# Patient Record
Sex: Male | Born: 1965
Health system: Southern US, Community
[De-identification: ages and names within clinical notes are randomized; demographics above are authoritative.]

## PROBLEM LIST (undated history)

## (undated) DIAGNOSIS — J45909 Unspecified asthma, uncomplicated: Secondary | ICD-10-CM

## (undated) DIAGNOSIS — R942 Abnormal results of pulmonary function studies: Secondary | ICD-10-CM

## (undated) DIAGNOSIS — I10 Essential (primary) hypertension: Secondary | ICD-10-CM

## (undated) DIAGNOSIS — M549 Dorsalgia, unspecified: Secondary | ICD-10-CM

## (undated) DIAGNOSIS — G4730 Sleep apnea, unspecified: Secondary | ICD-10-CM

## (undated) DIAGNOSIS — J449 Chronic obstructive pulmonary disease, unspecified: Secondary | ICD-10-CM

## (undated) HISTORY — DX: Chronic obstructive pulmonary disease, unspecified: J44.9

## (undated) HISTORY — PX: COLON SURGERY: SHX602

## (undated) HISTORY — DX: Unspecified asthma, uncomplicated: J45.909

---

## 2004-08-26 ENCOUNTER — Ambulatory Visit: Payer: Self-pay | Admitting: Family Medicine

## 2004-12-13 ENCOUNTER — Ambulatory Visit: Payer: Self-pay | Admitting: Gastroenterology

## 2005-06-11 ENCOUNTER — Encounter: Payer: Self-pay | Admitting: General Practice

## 2005-06-26 ENCOUNTER — Encounter: Payer: Self-pay | Admitting: General Practice

## 2005-07-27 ENCOUNTER — Encounter: Payer: Self-pay | Admitting: General Practice

## 2005-08-26 ENCOUNTER — Encounter: Payer: Self-pay | Admitting: General Practice

## 2010-11-11 ENCOUNTER — Other Ambulatory Visit: Payer: Self-pay | Admitting: Orthopedic Surgery

## 2010-11-11 DIAGNOSIS — R531 Weakness: Secondary | ICD-10-CM

## 2010-11-11 DIAGNOSIS — M25562 Pain in left knee: Secondary | ICD-10-CM

## 2010-11-14 ENCOUNTER — Ambulatory Visit
Admission: RE | Admit: 2010-11-14 | Discharge: 2010-11-14 | Disposition: A | Discharge status: Home / Self Care | Payer: BC Managed Care – PPO | Source: Ambulatory Visit | Attending: Orthopedic Surgery | Admitting: Orthopedic Surgery

## 2010-11-14 DIAGNOSIS — R531 Weakness: Secondary | ICD-10-CM

## 2010-11-14 DIAGNOSIS — M25562 Pain in left knee: Secondary | ICD-10-CM

## 2011-04-10 ENCOUNTER — Ambulatory Visit: Payer: Self-pay | Admitting: Gastroenterology

## 2011-12-19 ENCOUNTER — Ambulatory Visit: Payer: Self-pay | Admitting: Sports Medicine

## 2011-12-26 ENCOUNTER — Emergency Department: Payer: Self-pay | Admitting: Internal Medicine

## 2011-12-26 LAB — COMPREHENSIVE METABOLIC PANEL
Albumin: 3.7 g/dL (ref 3.4–5.0)
Alkaline Phosphatase: 77 U/L (ref 50–136)
Anion Gap: 6 — ABNORMAL LOW (ref 7–16)
BUN: 10 mg/dL (ref 7–18)
Bilirubin,Total: 0.4 mg/dL (ref 0.2–1.0)
Calcium, Total: 8.5 mg/dL (ref 8.5–10.1)
Chloride: 107 mmol/L (ref 98–107)
Co2: 27 mmol/L (ref 21–32)
Creatinine: 0.91 mg/dL (ref 0.60–1.30)
EGFR (African American): >60
EGFR (Non-African Amer.): >60
Glucose: 129 mg/dL — ABNORMAL HIGH (ref 65–99)
Osmolality: 280 (ref 275–301)
Potassium: 3.6 mmol/L (ref 3.5–5.1)
SGOT(AST): 23 U/L (ref 15–37)
SGPT (ALT): 42 U/L (ref 12–78)
Sodium: 140 mmol/L (ref 136–145)
Total Protein: 7.4 g/dL (ref 6.4–8.2)

## 2011-12-26 LAB — URINALYSIS, COMPLETE
Bacteria: NONE SEEN
Bilirubin,UR: NEGATIVE
Blood: NEGATIVE
Glucose,UR: NEGATIVE mg/dL (ref 0–75)
Ketone: NEGATIVE
Leukocyte Esterase: NEGATIVE
Nitrite: NEGATIVE
Ph: 7 (ref 4.5–8.0)
Protein: NEGATIVE
RBC,UR: 1 /HPF (ref 0–5)
Specific Gravity: 1.017 (ref 1.003–1.030)
Squamous Epithelial: NONE SEEN
WBC UR: 1 /HPF (ref 0–5)

## 2011-12-26 LAB — LIPASE, BLOOD: Lipase: 171 U/L (ref 73–393)

## 2011-12-27 ENCOUNTER — Emergency Department: Payer: Self-pay | Admitting: Emergency Medicine

## 2011-12-27 LAB — CBC
HCT: 44.8 % (ref 40.0–52.0)
HGB: 15.4 g/dL (ref 13.0–18.0)
MCH: 28.1 pg (ref 26.0–34.0)
MCHC: 34.3 g/dL (ref 32.0–36.0)
MCV: 82 fL (ref 80–100)
Platelet: 238 10*3/uL (ref 150–440)
RBC: 5.48 10*6/uL (ref 4.40–5.90)
RDW: 13.5 % (ref 11.5–14.5)
WBC: 8.4 10*3/uL (ref 3.8–10.6)

## 2012-01-01 ENCOUNTER — Ambulatory Visit: Payer: Self-pay | Admitting: Gastroenterology

## 2012-01-08 ENCOUNTER — Ambulatory Visit: Payer: Self-pay | Admitting: Gastroenterology

## 2012-01-13 LAB — PATHOLOGY REPORT

## 2012-01-28 ENCOUNTER — Ambulatory Visit: Payer: Self-pay | Admitting: Gastroenterology

## 2013-10-08 ENCOUNTER — Emergency Department: Payer: Self-pay | Admitting: Emergency Medicine

## 2013-10-08 LAB — COMPREHENSIVE METABOLIC PANEL
Albumin: 4 g/dL (ref 3.4–5.0)
Alkaline Phosphatase: 89 U/L
Anion Gap: 9 (ref 7–16)
BUN: 11 mg/dL (ref 7–18)
Bilirubin,Total: 0.5 mg/dL (ref 0.2–1.0)
Calcium, Total: 8.7 mg/dL (ref 8.5–10.1)
Chloride: 107 mmol/L (ref 98–107)
Co2: 23 mmol/L (ref 21–32)
Creatinine: 1.04 mg/dL (ref 0.60–1.30)
EGFR (African American): >60
EGFR (Non-African Amer.): >60
Glucose: 90 mg/dL (ref 65–99)
Osmolality: 276 (ref 275–301)
Potassium: 3.9 mmol/L (ref 3.5–5.1)
SGOT(AST): 31 U/L (ref 15–37)
SGPT (ALT): 39 U/L (ref 12–78)
Sodium: 139 mmol/L (ref 136–145)
Total Protein: 7.6 g/dL (ref 6.4–8.2)

## 2013-10-08 LAB — CBC WITH DIFFERENTIAL/PLATELET
Basophil #: 0.1 10*3/uL (ref 0.0–0.1)
Basophil %: 0.9 %
Eosinophil #: 0 10*3/uL (ref 0.0–0.7)
Eosinophil %: 0.2 %
HCT: 46.6 % (ref 40.0–52.0)
HGB: 15.8 g/dL (ref 13.0–18.0)
Lymphocyte #: 1.4 10*3/uL (ref 1.0–3.6)
Lymphocyte %: 13.4 %
MCH: 28.1 pg (ref 26.0–34.0)
MCHC: 33.9 g/dL (ref 32.0–36.0)
MCV: 83 fL (ref 80–100)
Monocyte #: 1.1 x10 3/mm — ABNORMAL HIGH (ref 0.2–1.0)
Monocyte %: 10.4 %
Neutrophil #: 7.9 10*3/uL — ABNORMAL HIGH (ref 1.4–6.5)
Neutrophil %: 75.1 %
Platelet: 224 10*3/uL (ref 150–440)
RBC: 5.62 10*6/uL (ref 4.40–5.90)
RDW: 13.7 % (ref 11.5–14.5)
WBC: 10.6 10*3/uL (ref 3.8–10.6)

## 2013-12-12 IMAGING — CR DG ABDOMEN 3V
1 series · 5 of 5 positions shown · non-contrast
Comparison: none

REASON FOR EXAM: epigastric pain
COMMENTS:

PROCEDURE:     DXR - DXR ABDOMEN 3-WAY (INCL PA CXR)  - December 26, 2011  [DATE]
RESULT:

[Series 1: pa · 0.17mm/px · 5 of 5 slices shown]
[im 1/5]
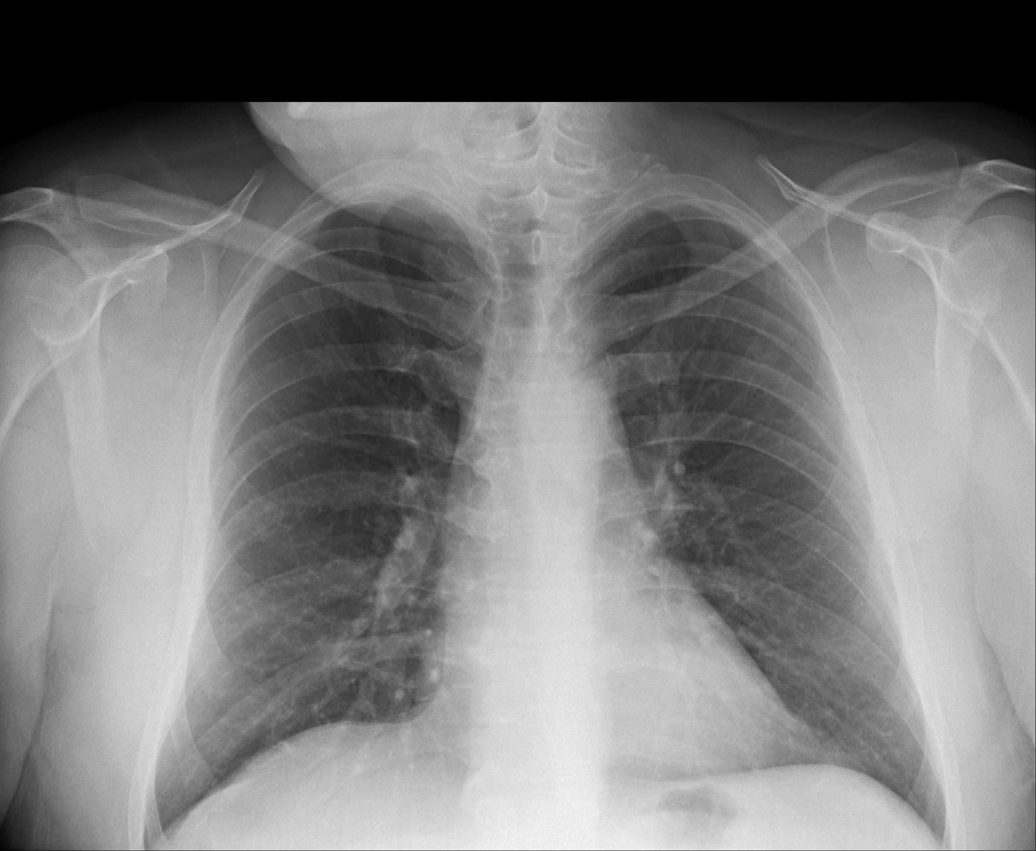
[im 2/5]
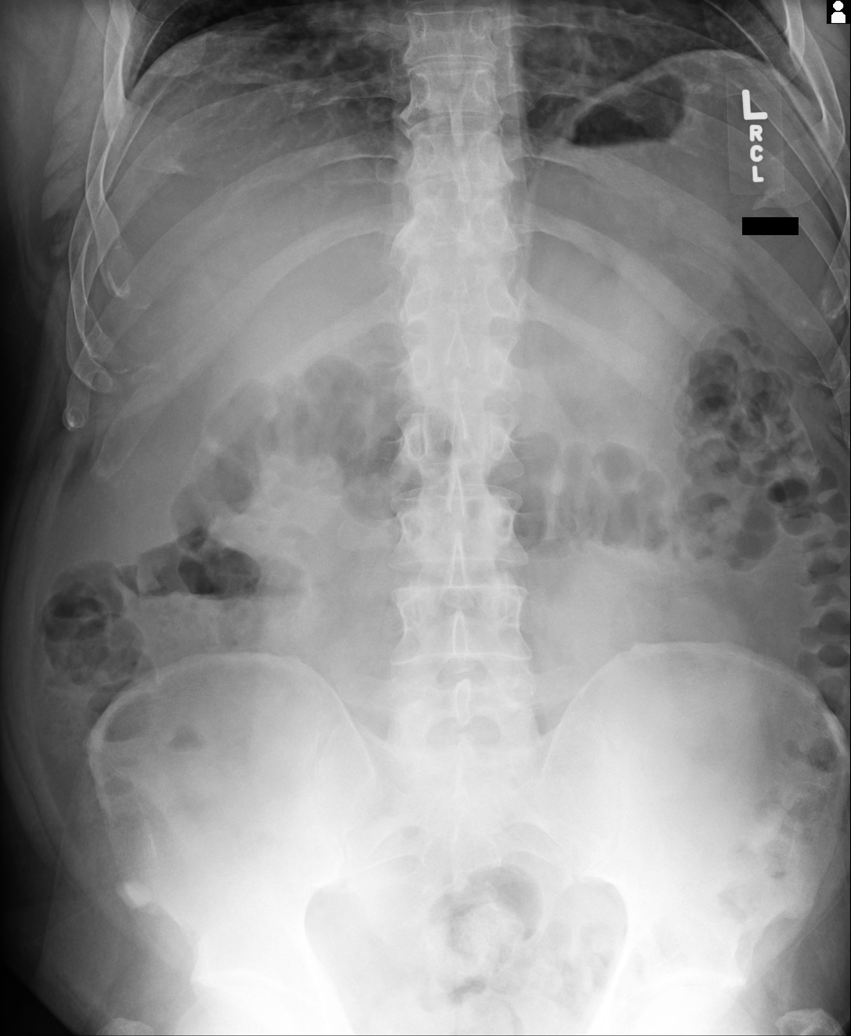
[im 3/5]
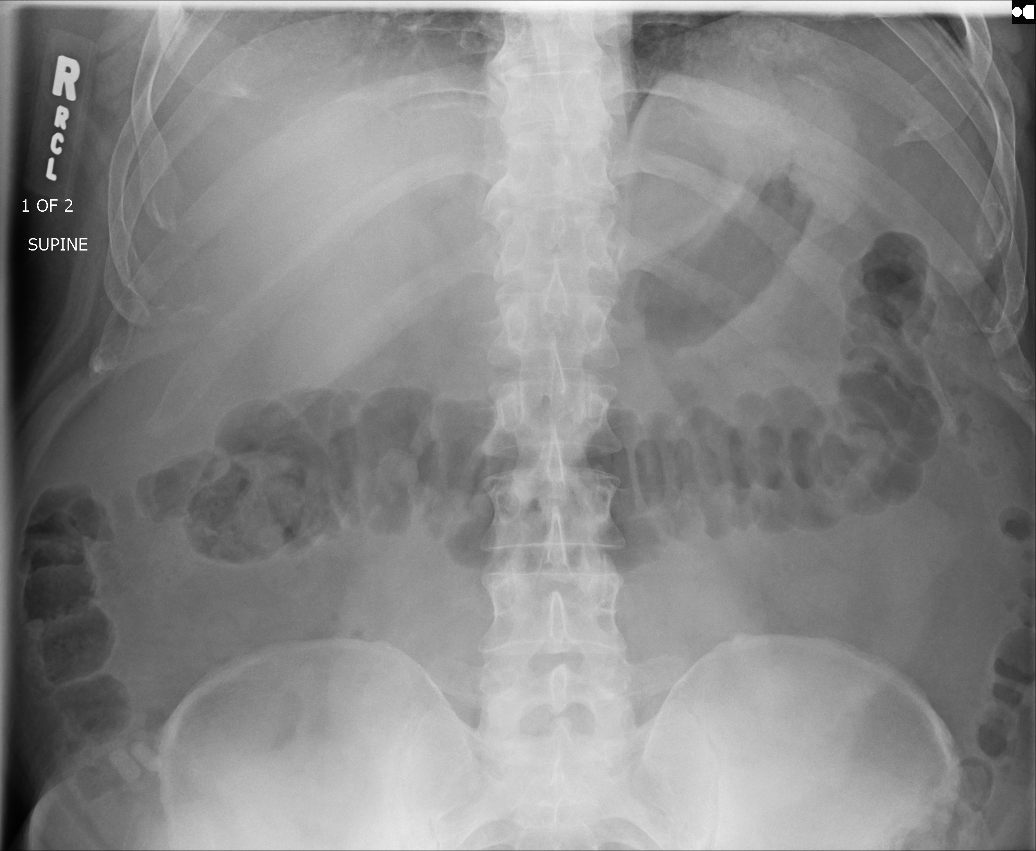
[im 4/5]
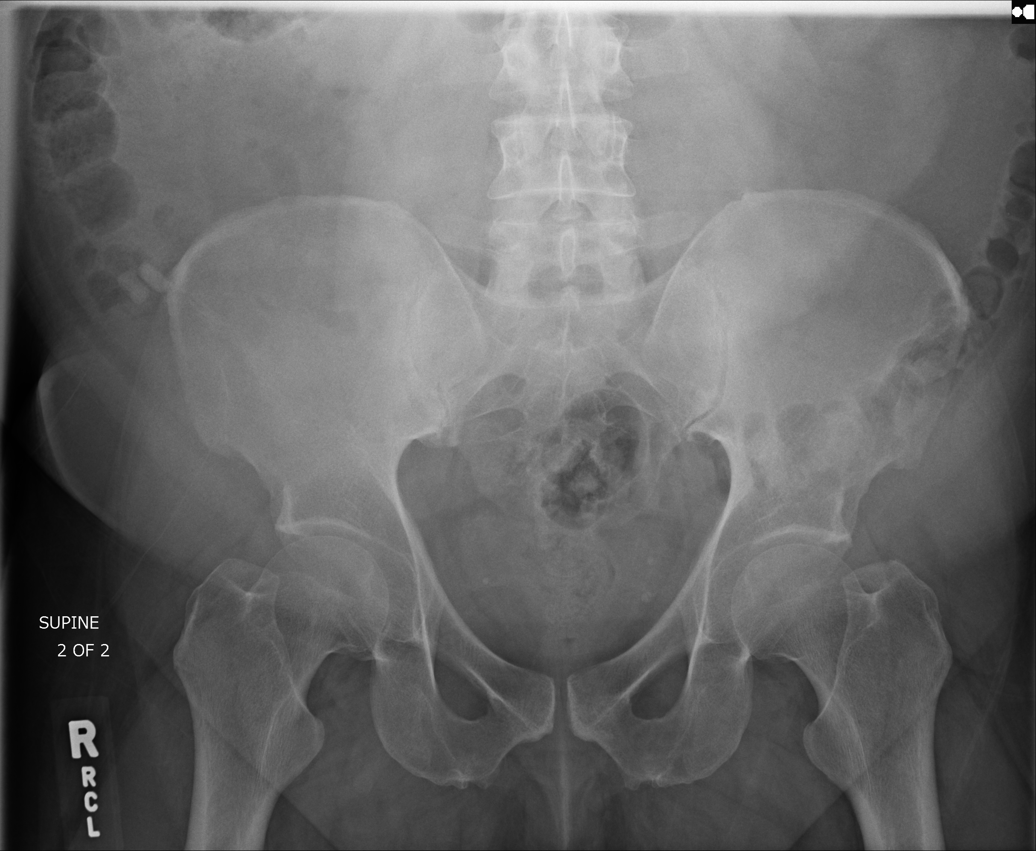
[im 5/5]
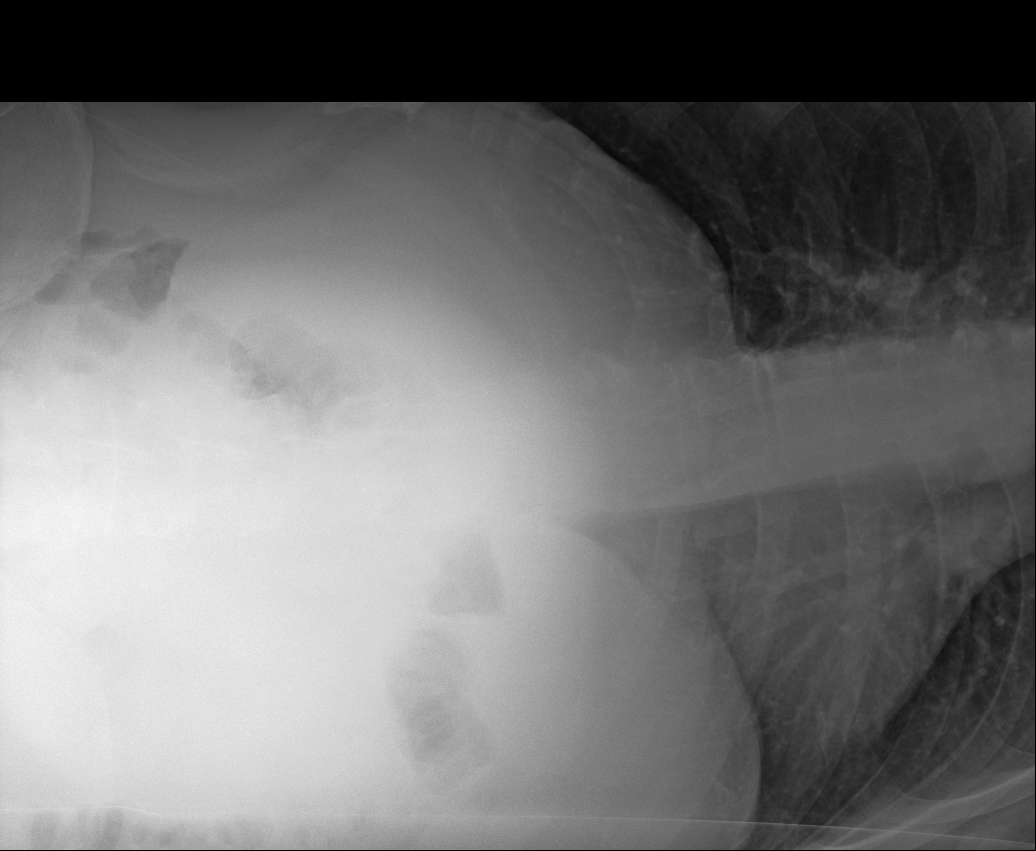

[5 of 5 positions shown; findings below may reference images not displayed]

FINDINGS: Frontal view of the chest demonstrates no evidence of focal
infiltrates, effusions or edema.

Air is seen within nondilated loops of large and small bowel. There is no
evidence of free air. The visualized bony skeleton is unremarkable.
IMPRESSION: 1. Chest radiograph without evidence of acute cardiopulmonary disease.
2. Nonobstructive bowel gas pattern.

## 2014-05-02 ENCOUNTER — Emergency Department: Payer: Self-pay | Admitting: Emergency Medicine

## 2014-05-02 LAB — URINALYSIS, COMPLETE
Bacteria: NONE SEEN
Bilirubin,UR: NEGATIVE
Blood: NEGATIVE
Glucose,UR: NEGATIVE mg/dL (ref 0–75)
Leukocyte Esterase: NEGATIVE
Nitrite: NEGATIVE
Ph: 6 (ref 4.5–8.0)
Protein: NEGATIVE
RBC,UR: 3 /HPF (ref 0–5)
Specific Gravity: 1.016 (ref 1.003–1.030)
Squamous Epithelial: NONE SEEN
WBC UR: 1 /HPF (ref 0–5)

## 2014-05-02 LAB — COMPREHENSIVE METABOLIC PANEL
Albumin: 4.3 g/dL (ref 3.4–5.0)
Alkaline Phosphatase: 88 U/L
Anion Gap: 11 (ref 7–16)
BUN: 14 mg/dL (ref 7–18)
Bilirubin,Total: 0.4 mg/dL (ref 0.2–1.0)
Calcium, Total: 8.9 mg/dL (ref 8.5–10.1)
Chloride: 105 mmol/L (ref 98–107)
Co2: 23 mmol/L (ref 21–32)
Creatinine: 0.96 mg/dL (ref 0.60–1.30)
EGFR (African American): >60
EGFR (Non-African Amer.): >60
Glucose: 124 mg/dL — ABNORMAL HIGH (ref 65–99)
Osmolality: 279 (ref 275–301)
Potassium: 3.6 mmol/L (ref 3.5–5.1)
SGOT(AST): 20 U/L (ref 15–37)
SGPT (ALT): 44 U/L
Sodium: 139 mmol/L (ref 136–145)
Total Protein: 7.6 g/dL (ref 6.4–8.2)

## 2014-05-02 LAB — CBC WITH DIFFERENTIAL/PLATELET
Basophil #: 0.1 10*3/uL (ref 0.0–0.1)
Basophil %: 1.2 %
Eosinophil #: 0.1 10*3/uL (ref 0.0–0.7)
Eosinophil %: 0.6 %
HCT: 48 % (ref 40.0–52.0)
HGB: 15.8 g/dL (ref 13.0–18.0)
Lymphocyte #: 2.8 10*3/uL (ref 1.0–3.6)
Lymphocyte %: 33.3 %
MCH: 27.1 pg (ref 26.0–34.0)
MCHC: 32.8 g/dL (ref 32.0–36.0)
MCV: 83 fL (ref 80–100)
Monocyte #: 0.7 x10 3/mm (ref 0.2–1.0)
Monocyte %: 8.7 %
Neutrophil #: 4.7 10*3/uL (ref 1.4–6.5)
Neutrophil %: 56.2 %
Platelet: 297 10*3/uL (ref 150–440)
RBC: 5.81 10*6/uL (ref 4.40–5.90)
RDW: 13.6 % (ref 11.5–14.5)
WBC: 8.4 10*3/uL (ref 3.8–10.6)

## 2014-12-08 ENCOUNTER — Observation Stay
Admission: EM | Admit: 2014-12-08 | Discharge: 2014-12-09 | Disposition: A | Discharge status: Home / Self Care | Payer: 59 | Attending: Internal Medicine | Admitting: Internal Medicine

## 2014-12-08 ENCOUNTER — Encounter: Payer: Self-pay | Admitting: Emergency Medicine

## 2014-12-08 ENCOUNTER — Emergency Department: Payer: 59

## 2014-12-08 DIAGNOSIS — M549 Dorsalgia, unspecified: Secondary | ICD-10-CM | POA: Diagnosis not present

## 2014-12-08 DIAGNOSIS — R51 Headache: Secondary | ICD-10-CM | POA: Diagnosis not present

## 2014-12-08 DIAGNOSIS — R0789 Other chest pain: Principal | ICD-10-CM | POA: Insufficient documentation

## 2014-12-08 DIAGNOSIS — R002 Palpitations: Secondary | ICD-10-CM | POA: Diagnosis present

## 2014-12-08 DIAGNOSIS — Z79899 Other long term (current) drug therapy: Secondary | ICD-10-CM | POA: Insufficient documentation

## 2014-12-08 DIAGNOSIS — M79602 Pain in left arm: Secondary | ICD-10-CM | POA: Insufficient documentation

## 2014-12-08 DIAGNOSIS — E876 Hypokalemia: Secondary | ICD-10-CM | POA: Diagnosis not present

## 2014-12-08 DIAGNOSIS — G4730 Sleep apnea, unspecified: Secondary | ICD-10-CM | POA: Insufficient documentation

## 2014-12-08 DIAGNOSIS — I10 Essential (primary) hypertension: Secondary | ICD-10-CM | POA: Diagnosis not present

## 2014-12-08 DIAGNOSIS — R42 Dizziness and giddiness: Secondary | ICD-10-CM | POA: Diagnosis not present

## 2014-12-08 DIAGNOSIS — Z87891 Personal history of nicotine dependence: Secondary | ICD-10-CM | POA: Diagnosis not present

## 2014-12-08 DIAGNOSIS — R079 Chest pain, unspecified: Secondary | ICD-10-CM

## 2014-12-08 HISTORY — DX: Sleep apnea, unspecified: G47.30

## 2014-12-08 HISTORY — DX: Essential (primary) hypertension: I10

## 2014-12-08 HISTORY — DX: Dorsalgia, unspecified: M54.9

## 2014-12-08 HISTORY — DX: Abnormal results of pulmonary function studies: R94.2

## 2014-12-08 LAB — TROPONIN I
Troponin I: <0.03 ng/mL (ref <0.031)
Troponin I: <0.03 ng/mL (ref <0.031)
Troponin I: <0.03 ng/mL (ref <0.031)

## 2014-12-08 LAB — COMPREHENSIVE METABOLIC PANEL WITH GFR
ALT: 34 U/L (ref 17–63)
AST: 25 U/L (ref 15–41)
Albumin: 4.4 g/dL (ref 3.5–5.0)
Alkaline Phosphatase: 72 U/L (ref 38–126)
Anion gap: 8 (ref 5–15)
BUN: 18 mg/dL (ref 6–20)
CO2: 26 mmol/L (ref 22–32)
Calcium: 9.3 mg/dL (ref 8.9–10.3)
Chloride: 106 mmol/L (ref 101–111)
Creatinine, Ser: 0.95 mg/dL (ref 0.61–1.24)
GFR calc Af Amer: >60 mL/min (ref >60)
GFR calc non Af Amer: >60 mL/min (ref >60)
Glucose, Bld: 155 mg/dL — ABNORMAL HIGH (ref 65–99)
Potassium: 3.4 mmol/L — ABNORMAL LOW (ref 3.5–5.1)
Sodium: 140 mmol/L (ref 135–145)
Total Bilirubin: 0.7 mg/dL (ref 0.3–1.2)
Total Protein: 7.2 g/dL (ref 6.5–8.1)

## 2014-12-08 LAB — CBC
HCT: 43.8 % (ref 40.0–52.0)
Hemoglobin: 14.8 g/dL (ref 13.0–18.0)
MCH: 27.9 pg (ref 26.0–34.0)
MCHC: 33.8 g/dL (ref 32.0–36.0)
MCV: 82.6 fL (ref 80.0–100.0)
Platelets: 243 10*3/uL (ref 150–440)
RBC: 5.3 MIL/uL (ref 4.40–5.90)
RDW: 13.8 % (ref 11.5–14.5)
WBC: 6.5 10*3/uL (ref 3.8–10.6)

## 2014-12-08 MED ORDER — TIZANIDINE HCL 4 MG PO TABS: 4.0000 mg | ORAL_TABLET | Freq: Three times a day (TID) | ORAL | Status: DC | PRN

## 2014-12-08 MED ORDER — AMLODIPINE BESYLATE 5 MG PO TABS
5.0000 mg | ORAL_TABLET | Freq: Every day | ORAL | Status: DC
  Administered 2014-12-09: 5 mg via ORAL
  Filled 2014-12-08: qty 1

## 2014-12-08 MED ORDER — ENOXAPARIN SODIUM 40 MG/0.4ML ~~LOC~~ SOLN
40.0000 mg | SUBCUTANEOUS | Status: DC
Start: 2014-12-08 — End: 2014-12-09
  Administered 2014-12-08: 40 mg via SUBCUTANEOUS
  Filled 2014-12-08: qty 0.4

## 2014-12-08 MED ORDER — HYDROCODONE-ACETAMINOPHEN 5-325 MG PO TABS
1.0000 | ORAL_TABLET | Freq: Four times a day (QID) | ORAL | Status: DC | PRN
  Administered 2014-12-08 – 2014-12-09 (×3): 1 via ORAL
  Filled 2014-12-08: qty 2
  Filled 2014-12-08 (×2): qty 1

## 2014-12-08 MED ORDER — ASPIRIN EC 81 MG PO TBEC
81.0000 mg | DELAYED_RELEASE_TABLET | Freq: Every day | ORAL | Status: DC
  Administered 2014-12-09: 81 mg via ORAL
  Filled 2014-12-08 (×2): qty 1

## 2014-12-08 MED ORDER — ACETAMINOPHEN 325 MG PO TABS: 650.0000 mg | ORAL_TABLET | Freq: Four times a day (QID) | ORAL | Status: DC | PRN

## 2014-12-08 MED ORDER — ASPIRIN 81 MG PO CHEW
324.0000 mg | CHEWABLE_TABLET | Freq: Once | ORAL | Status: AC
  Administered 2014-12-08: 324 mg via ORAL
  Filled 2014-12-08: qty 4

## 2014-12-08 MED ORDER — ACETAMINOPHEN 650 MG RE SUPP: 650.0000 mg | Freq: Four times a day (QID) | RECTAL | Status: DC | PRN

## 2014-12-08 MED ORDER — POTASSIUM CHLORIDE CRYS ER 20 MEQ PO TBCR
40.0000 meq | EXTENDED_RELEASE_TABLET | Freq: Once | ORAL | Status: AC
  Administered 2014-12-08: 40 meq via ORAL
  Filled 2014-12-08: qty 2

## 2014-12-08 MED ORDER — SODIUM CHLORIDE 0.9 % IJ SOLN
3.0000 mL | Freq: Two times a day (BID) | INTRAMUSCULAR | Status: DC
  Administered 2014-12-08 – 2014-12-09 (×2): 3 mL via INTRAVENOUS

## 2014-12-08 MED ORDER — TRAMADOL HCL 50 MG PO TABS
50.0000 mg | ORAL_TABLET | Freq: Three times a day (TID) | ORAL | Status: DC
  Administered 2014-12-08 – 2014-12-09 (×2): 50 mg via ORAL
  Filled 2014-12-08 (×2): qty 1

## 2014-12-08 NOTE — ED Provider Notes (Signed)
[facility] Emergency Department Provider Note ____________________________________________  Time seen: Approximately 3:44 PM  I have reviewed the triage vital signs and the nursing notes.   HISTORY  Chief Complaint Chest Pain   HPI Evan Cherry is a 49 y.o. male with past medical history of hypertension who presents with chest pain that began today around 50 AM while he was doing some programming on his computer and was under some emotional stress. It was associated with shortness of breath but not nausea or sweating. It lasted approximately until 1 PM and waxed and waned during that timeframe. It was left substernal and was a max of 4 out of 10 and has resolved after patient took his tramadol and extra strength Tylenol for his chronic back pain and is now 1 out of 10. He also notes that he had left arm pain under his axilla since 9 PM last night that has been intermittent. He's also had intermittent lightheadedness about once every other week for the past 6 months to one year. 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 He notes having about 4 episodes of "heart fluttering" that resolved when he intentionally coughs hard.  No recent fever, upper respiratory illness, vomiting, diarrhea.  Past Medical History  Diagnosis Date  . Hypertension     There are no active problems to display for this patient.   History reviewed. No pertinent past surgical history.  Current Outpatient Rx  Name  Route  Sig  Dispense  Refill  . acetaminophen (TYLENOL) 500 MG tablet   Oral   Take 500 mg by mouth 3 (three) times daily.         Marland Kitchen amLODipine (NORVASC) 5 MG tablet   Oral   Take 5 mg by mouth daily.         Marland Kitchen HYDROcodone-acetaminophen (NORCO/VICODIN) 5-325 MG per tablet   Oral   Take 1-2 tablets by mouth every 6 (six) hours as needed for moderate pain.         . meloxicam (MOBIC) 15 MG  tablet   Oral   Take 15 mg by mouth daily.         . Multiple Vitamins-Minerals (MULTIVITAMIN PO)   Oral   Take 1 tablet by mouth daily.         Marland Kitchen tiZANidine (ZANAFLEX) 4 MG tablet   Oral   Take 4 mg by mouth 3 (three) times daily as needed for muscle spasms.         . traMADol (ULTRAM) 50 MG tablet   Oral   Take 50 mg by mouth 3 (three) times daily.           Allergies Review of patient's allergies indicates no known allergies.  No family history on file.  Social History Social History  Substance Use Topics  . Smoking status: Former Research scientist (life sciences)  . Smokeless tobacco: Current User    Types: Snuff  . Alcohol Use: No    Review of Systems Constitutional: No fever ENT: No URI Cardiovascular: See history of present illness  Respiratory: See history of present illness Gastrointestinal: No abdominal pain.   Musculoskeletal: Chronic Back pain is at baseline   10-point ROS otherwise negative.  ____________________________________________   PHYSICAL EXAM:  VITAL SIGNS: ED Triage Vitals  Enc Vitals Group     BP 12/08/14 1228 143/82 mmHg     Pulse Rate 12/08/14 1228 72  Resp 12/08/14 1228 18     Temp 12/08/14 1228 99.2 F (37.3 C)     Temp Source 12/08/14 1228 Oral     SpO2 12/08/14 1228 96 %     Weight --      Height --      Head Cir --      Peak Flow --      Pain Score 12/08/14 1225 4     Pain Loc --      Pain Edu? --      Excl. in [place]? --    Constitutional: Alert and oriented. Well appearing and in no acute distress. Eyes: Conjunctivae are normal. PERRL. EOMI. Head: Atraumatic. Nose: No congestion/rhinnorhea. Mouth/Throat: Mucous membranes are moist.  Oropharynx non-erythematous. Neck: No stridor.   Lymphatic: No cervical lymphadenopathy. Cardiovascular: Normal rate, regular rhythm. Grossly normal heart sounds.  Peripheral pulses 2+ B Respiratory: Normal respiratory effort.  No retractions. Lungs CTAB. Gastrointestinal: Soft and nontender. No  distention. Normal bowel sounds.  Musculoskeletal: No lower extremity tenderness nor edema.  No calf TTP. Neurologic:  Normal speech and language. No gross focal neurologic deficits are appreciated. Speech is normal.  Skin:  Skin is warm, dry and intact. No rash noted. Psychiatric: Mood and affect are normal. Speech and behavior are normal.  ____________________________________________   LABS (all labs ordered are listed, but only abnormal results are displayed)  Labs Reviewed  COMPREHENSIVE METABOLIC PANEL - Abnormal; Notable for the following:    Potassium 3.4 (*)    Glucose, Bld 155 (*)    All other components within normal limits  CBC  TROPONIN I  TROPONIN I   ____________________________________________  EKG   Date: 12/08/2014  Rate: 73  Rhythm: normal sinus rhythm  QRS Axis: normal  Intervals: normal  ST/T Wave abnormalities: normal  Conduction Disutrbances: none  Narrative Interpretation: unremarkable   ____________________________________________  RADIOLOGY  Chest x-ray-no acute disease ____________________________________________   PROCEDURES  Procedure(s) performed: none  Critical Care performed: none ____________________________________________   INITIAL IMPRESSION / ASSESSMENT AND PLAN / ED COURSE  Pertinent labs & imaging results that were available during my care of the patient were reviewed by me and considered in my medical decision making (see chart for details).  Chest pain concerning for possible ACS. Will admit to cycle cardiac enzymes and undergo stress testing. Discussed with Dr. Leslye Peer, Prime Doc who will admit. ____________________________________________   FINAL CLINICAL IMPRESSION(S) / ED DIAGNOSES  Final diagnoses:  Chest pain radiating to arm  Intermittent palpitations       Ponciano Ort, MD 12/08/14 1609

## 2014-12-08 NOTE — ED Notes (Signed)
Pt to ED with c/o midsternal cp that started this am and has been constant, yesterday states he had some intermitted left arm pain and numbness

## 2014-12-08 NOTE — Progress Notes (Addendum)
Pt. admitted to unit, [id number]. Oriented to room, call bell, Ascom phones and staff. Bed in low position. Fall safety plan reviewed, contract signed and placed on wall, BROWN non-skid socks in place, bed alarm not indicated. Full assessment to Epic. Will continue to monitor.  Consent for exercise stress test obtained and placed in chart.

## 2014-12-08 NOTE — H&P (Signed)
[place] at [place] NAME: Evan Cherry    MR#:  [id number]  DATE OF BIRTH:  [birth year]  DATE OF ADMISSION:  12/08/2014  PRIMARY CARE PHYSICIAN: Maryland Pink, MD   REQUESTING/REFERRING PHYSICIAN: Sydnee Cabal  CHIEF COMPLAINT:   Chief Complaint  Patient presents with  . Chest Pain    HISTORY OF PRESENT ILLNESS:  Evan Cherry  is a 49 y.o. male with a known history of hypertension, decreased lung capacity, sleep apnea, chronic back pain. Patient states that his blood pressure is been high for a few years. He's been having dizzy spells on and off over the last year. He's been having some on and off chest pain. One week ago he had chest pain lasting for 15-20 minutes described as a pressure in his chest. It went away on its own. Today he had 2 hours with the chest pain described as a knot in his chest pressure 4 out of 10 intensity. He could not take a deep breath with it he had a headache. Last night he did have left inner arm pain described as an ache. 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 He was at work with both of these episodes but wasn't doing really strenuous things at that time.  PAST MEDICAL HISTORY:   Past Medical History  Diagnosis Date  . Hypertension   . Sleep apnea   . Decreased lung capacity   . Back pain     PAST SURGICAL HISTORY:  History reviewed. No pertinent past surgical history.  SOCIAL HISTORY:   Social History  Substance Use Topics  . Smoking status: Former Research scientist (life sciences)  . Smokeless tobacco: Current User    Types: Snuff  . Alcohol Use: No    FAMILY HISTORY:   Family History  Problem Relation Age of Onset  . Adopted: Yes    DRUG ALLERGIES:  No Known Allergies  REVIEW OF SYSTEMS:  CONSTITUTIONAL: No fever, positive for weakness.  EYES: No blurred or double vision. Worsening vision in the last year EARS, NOSE, AND THROAT: No tinnitus or ear pain. No sore  throat RESPIRATORY: No cough, positive for shortness of breath, no wheezing or hemoptysis.  CARDIOVASCULAR: Positive for chest pain, no orthopnea, edema.  GASTROINTESTINAL: No nausea, vomiting, diarrhea or abdominal pain. No blood in bowel movements GENITOURINARY: No dysuria, hematuria.  ENDOCRINE: No polyuria, nocturia,  HEMATOLOGY: No anemia, easy bruising or bleeding SKIN: No rash or lesion. MUSCULOSKELETAL: Positive for back pain and knee pain.   NEUROLOGIC: No tingling, numbness. Some dizziness episodes in past. PSYCHIATRY: No anxiety or depression.   MEDICATIONS AT HOME:   Prior to Admission medications   Medication Sig Start Date End Date Taking? Authorizing Provider  acetaminophen (TYLENOL) 500 MG tablet Take 500 mg by mouth 3 (three) times daily.   Yes Historical Provider, MD  amLODipine (NORVASC) 5 MG tablet Take 5 mg by mouth daily.   Yes Historical Provider, MD  HYDROcodone-acetaminophen (NORCO/VICODIN) 5-325 MG per tablet Take 1-2 tablets by mouth every 6 (six) hours as needed for moderate pain.   Yes Historical Provider, MD  meloxicam (MOBIC) 15 MG tablet Take 15 mg by mouth daily.   Yes Historical Provider, MD  Multiple Vitamins-Minerals (MULTIVITAMIN PO) Take 1 tablet by mouth daily.   Yes Historical Provider, MD  tiZANidine (ZANAFLEX) 4 MG tablet Take 4 mg by mouth 3 (three) times daily as needed for muscle spasms.  Yes Historical Provider, MD  traMADol (ULTRAM) 50 MG tablet Take 50 mg by mouth 3 (three) times daily.   Yes Historical Provider, MD      VITAL SIGNS:  Blood pressure 153/86, pulse 64, temperature 99.2 F (37.3 C), temperature source Oral, resp. rate 22, SpO2 96 %.  PHYSICAL EXAMINATION:  GENERAL:  49 y.o.-year-old patient lying in the bed with no acute distress.  EYES: Pupils equal, round, reactive to light and accommodation. No scleral icterus. Extraocular muscles intact.  HEENT: Head atraumatic, normocephalic. Oropharynx and nasopharynx clear.  NECK:   Supple, no jugular venous distention. No thyroid enlargement, no tenderness.  LUNGS: Normal breath sounds bilaterally, no wheezing, rales,rhonchi or crepitation. No use of accessory muscles of respiration.  CARDIOVASCULAR: S1, S2 normal. No murmurs, rubs, or gallops. No chest wall pain to palpation ABDOMEN: Soft, nontender, nondistended. Bowel sounds present. No organomegaly or mass.  EXTREMITIES: No pedal edema, cyanosis, or clubbing.  NEUROLOGIC: Cranial nerves II through XII are intact. Muscle strength 5/5 in all extremities. Sensation intact. Gait not checked.  PSYCHIATRIC: The patient is alert and oriented x 3.  SKIN: No rash, lesion, or ulcer.   LABORATORY PANEL:   CBC  Recent Labs Lab 12/08/14 1230  WBC 6.5  HGB 14.8  HCT 43.8  PLT 243   ------------------------------------------------------------------------------------------------------------------  Chemistries   Recent Labs Lab 12/08/14 1230  NA 140  K 3.4*  CL 106  CO2 26  GLUCOSE 155*  BUN 18  CREATININE 0.95  CALCIUM 9.3  AST 25  ALT 34  ALKPHOS 72  BILITOT 0.7   ------------------------------------------------------------------------------------------------------------------  Cardiac Enzymes  Recent Labs Lab 12/08/14 1230  TROPONINI <0.03   ------------------------------------------------------------------------------------------------------------------  RADIOLOGY:  Dg Chest 2 View  12/08/2014   CLINICAL DATA:  Patient has been having recent episodes of dizziness and tightness in his chest, and began having increased chest tightness and left arm pain since last night. Patient has a HX of smoking, HTN, and had medication increased 1 month ago.  EXAM: CHEST  2 VIEW  COMPARISON:  None.  FINDINGS: The heart size and mediastinal contours are within normal limits. Both lungs are clear. No pleural effusion or pneumothorax. Bony thorax is intact.  IMPRESSION: No active cardiopulmonary disease.    Electronically Signed   By: Lajean Manes M.D.   On: 12/08/2014 14:25    EKG:   Normal sinus rhythm 73 bpm  IMPRESSION AND PLAN:   1. Chest pain. ER physician felt patient should come in for a stress test. I will order a stress test for tomorrow and get serial cardiac enzymes and observe overnight on telemetry. Start aspirin. 2. Essential hypertension continue Norvasc. 3. Hypokalemia we'll give a dose of potassium. 4. Sleep apnea- wife to bring in CPAP overnight. 5. Knee and back pain- continue pain medications and muscle relaxant.  All the records are reviewed and case discussed with ED provider. Management plans discussed with the patient, family and they are in agreement.  CODE STATUS: Full code  TOTAL TIME TAKING CARE OF THIS PATIENT: 50 minutes.    Loletha Grayer M.D on 12/08/2014 at 4:42 PM  Between 7am to 6pm - Pager - [phone]  After 6pm call admission pager [facility]  Office  [phone]  CC: Primary care physician; Maryland Pink, MD

## 2014-12-09 LAB — CBC
HCT: 47.6 % (ref 40.0–52.0)
Hemoglobin: 15.8 g/dL (ref 13.0–18.0)
MCH: 27.8 pg (ref 26.0–34.0)
MCHC: 33.1 g/dL (ref 32.0–36.0)
MCV: 83.9 fL (ref 80.0–100.0)
Platelets: 233 10*3/uL (ref 150–440)
RBC: 5.68 MIL/uL (ref 4.40–5.90)
RDW: 13.8 % (ref 11.5–14.5)
WBC: 7.6 10*3/uL (ref 3.8–10.6)

## 2014-12-09 LAB — BASIC METABOLIC PANEL
Anion gap: 9 (ref 5–15)
BUN: 15 mg/dL (ref 6–20)
CO2: 26 mmol/L (ref 22–32)
Calcium: 9.2 mg/dL (ref 8.9–10.3)
Chloride: 107 mmol/L (ref 101–111)
Creatinine, Ser: 0.82 mg/dL (ref 0.61–1.24)
GFR calc Af Amer: >60 mL/min (ref >60)
GFR calc non Af Amer: >60 mL/min (ref >60)
Glucose, Bld: 92 mg/dL (ref 65–99)
Potassium: 4.2 mmol/L (ref 3.5–5.1)
Sodium: 142 mmol/L (ref 135–145)

## 2014-12-09 MED ORDER — BENAZEPRIL-HYDROCHLOROTHIAZIDE 20-12.5 MG PO TABS: 1.0000 | ORAL_TABLET | Freq: Every day | ORAL | Status: DC

## 2014-12-09 NOTE — Progress Notes (Signed)
Spoke with [facility] regarding exercise stress test for patient. They do not have a tech available for the test and are unable to preform today. Dr. Vianne Bulls notified and she asked that Evan Cherry have a MyoView test, however per [facility], would have to wait until Monday as dye is radioactive and has to be ordered before 0830. Dr. Vianne Bulls has been notified and made aware. MD is trying to set up outpatient stress test for patient at this time with Dr. Ubaldo Glassing.

## 2014-12-09 NOTE — Progress Notes (Signed)
Pt. Discharged to home via wc. Discharge instructions and medication regimen reviewed at bedside with patient. Pt. verbalizes understanding of instructions and medication regimen. Patient assessment unchanged from this morning. TELE and IV discontinued per policy.   

## 2014-12-11 NOTE — Discharge Summary (Signed)
Evan Cherry, is a 48 y.o. male  DOB Jan 27, 1966  MRN [id number].  Admission date:  12/08/2014  Admitting Physician  Loletha Grayer, MD  Discharge Date:  12/11/2014   Primary MD  Maryland Pink, MD  Recommendations for primary care physician for things to follow:   Follow up with Dr.Fath on Monday for stress test   Admission Diagnosis  Chest pain radiating to arm [R07.89] Intermittent palpitations [R00.2]   Discharge Diagnosis  Chest pain radiating to arm [R07.89] Intermittent palpitations [R00.2]    Active Problems:   Chest pain      Past Medical History  Diagnosis Date  . Hypertension   . Sleep apnea   . Decreased lung capacity   . Back pain     History reviewed. No pertinent past surgical history.     History of present illness and  Hospital Course:     Kindly see H&P for history of present illness and admission details, please review complete Labs, Consult reports and Test reports for all details in brief  HPI  from the history and physical done on the day of admission  49 yr old  Male with  htn ,chronic  Back pain,admitted for chest pain.intial  troponin's are  Negative.  Hospital Course  1.Atypical chest pain;troponin are negative ,scheduled to  Have out pt stress test. 2.Essential HTn;controlled 3.hypokalemia;replaced Sleep apnea;cpap at night Stress test could not be done due to schedule problem,   Discharge Condition: stable   Follow UP  Follow-up Information    Follow up with Maryland Pink, MD In 1 week.   Specialty:  Family Medicine   Contact information:   [place] [address] [phone]       Follow up with Teodoro Spray., MD In 2 days.   Specialty:  Cardiology   Why:  Call Dr Bethanne Ginger office on Monday morning at [phone] to schedule outpatient stress  test, ccs   Contact information:   [place] [address] [phone]         Discharge Instructions  and  Discharge Medications       Medication List    STOP taking these medications        amLODipine 5 MG tablet  Commonly known as:  NORVASC      TAKE these medications        acetaminophen 500 MG tablet  Commonly known as:  TYLENOL  Take 500 mg by mouth 3 (three) times daily.     benazepril-hydrochlorthiazide 20-12.5 MG per tablet  Commonly known as:  LOTENSIN HCT  Take 1 tablet by mouth daily.     HYDROcodone-acetaminophen 5-325 MG per tablet  Commonly known as:  NORCO/VICODIN  Take 1-2 tablets by mouth every 6 (six) hours as needed for moderate pain.     meloxicam 15 MG tablet  Commonly known as:  MOBIC  Take 15 mg by mouth daily.     MULTIVITAMIN PO  Take 1 tablet by mouth daily.     tiZANidine 4 MG tablet  Commonly known as:  ZANAFLEX  Take 4 mg by mouth 3 (three) times daily as needed for muscle spasms.     traMADol 50 MG tablet  Commonly known as:  ULTRAM  Take 50 mg by mouth 3 (three) times daily.          Diet and Activity recommendation: See Discharge Instructions above   Consults obtained -none   Major procedures and Radiology Reports -  PLEASE review detailed and final reports for all details, in brief -    Dg Chest 2 View  12/08/2014   CLINICAL DATA:  Patient has been having recent episodes of dizziness and tightness in his chest, and began having increased chest tightness and left arm pain since last night. Patient has a HX of smoking, HTN, and had medication increased 1 month ago.  EXAM: CHEST  2 VIEW  COMPARISON:  None.  FINDINGS: The heart size and mediastinal contours are within normal limits. Both lungs are clear. No pleural effusion or pneumothorax. Bony thorax is intact.  IMPRESSION: No active cardiopulmonary disease.   Electronically Signed   By: Lajean Manes M.D.   On: 12/08/2014 14:25    Micro Results     No results found for this or any previous visit (from the past 240 hour(s)).     Today   Subjective:   Evan Cherry today has no headache,no chest abdominal pain,no new weakness tingling or numbness, feels much better wants to go home today.  Objective:   Blood pressure 154/85, pulse 74, temperature 98 F (36.7 C), temperature source Oral, resp. rate 18, height 5\' 8"  (1.727 m), weight 101.152 kg (223 lb), SpO2 99 %.  No intake or output data in the 24 hours ending 12/11/14 1753  Exam Awake Alert, Oriented x 3, No new F.N deficits, Normal affect [place].AT,PERRAL Supple Neck,No JVD, No cervical lymphadenopathy appriciated.  Symmetrical Chest wall movement, Good air movement bilaterally, CTAB RRR,No Gallops,Rubs or new Murmurs, No Parasternal Heave +ve B.Sounds, Abd Soft, Non tender, No organomegaly appriciated, No rebound -guarding or rigidity. No Cyanosis, Clubbing or edema, No new Rash or bruise  Data Review   CBC w Diff:  Lab Results  Component Value Date   WBC 7.6 12/09/2014   WBC 8.4 05/02/2014   HGB 15.8 12/09/2014   HGB 15.8 05/02/2014   HCT 47.6 12/09/2014   HCT 48.0 05/02/2014   PLT 233 12/09/2014   PLT 297 05/02/2014   LYMPHOPCT 33.3 05/02/2014   MONOPCT 8.7 05/02/2014   EOSPCT 0.6 05/02/2014   BASOPCT 1.2 05/02/2014    CMP:  Lab Results  Component Value Date   NA 142 12/09/2014   NA 139 05/02/2014   K 4.2 12/09/2014   K 3.6 05/02/2014   CL 107 12/09/2014   CL 105 05/02/2014   CO2 26 12/09/2014   CO2 23 05/02/2014   BUN 15 12/09/2014   BUN 14 05/02/2014   CREATININE 0.82 12/09/2014   CREATININE 0.96 05/02/2014   PROT 7.2 12/08/2014   PROT 7.6 05/02/2014   ALBUMIN 4.4 12/08/2014   ALBUMIN 4.3 05/02/2014   BILITOT 0.7 12/08/2014   BILITOT 0.4 05/02/2014   ALKPHOS 72 12/08/2014   ALKPHOS 88 05/02/2014   AST 25 12/08/2014   AST 20 05/02/2014   ALT 34 12/08/2014   ALT 44 05/02/2014  .   Total Time in preparing paper work, data evaluation  and todays exam - 55 minutes  Litzi Binning M.D on 12/11/2014 at 5:53 PM

## 2014-12-11 NOTE — Progress Notes (Signed)
[place] at [place] was admitted to the Hospital on 12/08/2014 and Discharged  12/11/2014 and should be excused from work/school   for 1 days starting 12/11/2014 , may return to work/school without any restrictions starting 12/13/2014.  Max Sane M.D on 12/11/2014,at 5:11 PM  [place] at [facility]  [phone]

## 2016-04-29 DIAGNOSIS — Z79899 Other long term (current) drug therapy: Secondary | ICD-10-CM | POA: Diagnosis not present

## 2016-06-13 DIAGNOSIS — J0100 Acute maxillary sinusitis, unspecified: Secondary | ICD-10-CM | POA: Diagnosis not present

## 2016-06-27 DIAGNOSIS — R5383 Other fatigue: Secondary | ICD-10-CM | POA: Diagnosis not present

## 2016-08-19 DIAGNOSIS — I10 Essential (primary) hypertension: Secondary | ICD-10-CM | POA: Diagnosis not present

## 2016-08-19 DIAGNOSIS — M545 Low back pain: Secondary | ICD-10-CM | POA: Diagnosis not present

## 2016-08-19 DIAGNOSIS — E538 Deficiency of other specified B group vitamins: Secondary | ICD-10-CM | POA: Diagnosis not present

## 2016-08-19 DIAGNOSIS — R7989 Other specified abnormal findings of blood chemistry: Secondary | ICD-10-CM | POA: Diagnosis not present

## 2016-09-11 DIAGNOSIS — E349 Endocrine disorder, unspecified: Secondary | ICD-10-CM | POA: Diagnosis not present

## 2016-11-10 DIAGNOSIS — Z0000 Encounter for general adult medical examination without abnormal findings: Secondary | ICD-10-CM | POA: Diagnosis not present

## 2016-11-17 DIAGNOSIS — J019 Acute sinusitis, unspecified: Secondary | ICD-10-CM | POA: Diagnosis not present

## 2016-11-17 DIAGNOSIS — I10 Essential (primary) hypertension: Secondary | ICD-10-CM | POA: Diagnosis not present

## 2016-11-17 DIAGNOSIS — Z0000 Encounter for general adult medical examination without abnormal findings: Secondary | ICD-10-CM | POA: Diagnosis not present

## 2017-02-26 DIAGNOSIS — M5414 Radiculopathy, thoracic region: Secondary | ICD-10-CM | POA: Diagnosis not present

## 2017-03-05 DIAGNOSIS — M47814 Spondylosis without myelopathy or radiculopathy, thoracic region: Secondary | ICD-10-CM | POA: Diagnosis not present

## 2017-03-17 DIAGNOSIS — K640 First degree hemorrhoids: Secondary | ICD-10-CM | POA: Diagnosis not present

## 2017-03-17 DIAGNOSIS — K635 Polyp of colon: Secondary | ICD-10-CM | POA: Diagnosis not present

## 2017-03-17 DIAGNOSIS — K648 Other hemorrhoids: Secondary | ICD-10-CM | POA: Diagnosis not present

## 2017-03-17 DIAGNOSIS — Z86010 Personal history of colonic polyps: Secondary | ICD-10-CM | POA: Diagnosis not present

## 2017-03-17 DIAGNOSIS — D126 Benign neoplasm of colon, unspecified: Secondary | ICD-10-CM | POA: Diagnosis not present

## 2017-05-19 DIAGNOSIS — E538 Deficiency of other specified B group vitamins: Secondary | ICD-10-CM | POA: Diagnosis not present

## 2017-06-03 DIAGNOSIS — R197 Diarrhea, unspecified: Secondary | ICD-10-CM | POA: Diagnosis not present

## 2017-06-03 DIAGNOSIS — R112 Nausea with vomiting, unspecified: Secondary | ICD-10-CM | POA: Diagnosis not present

## 2017-12-07 DIAGNOSIS — E291 Testicular hypofunction: Secondary | ICD-10-CM | POA: Diagnosis not present

## 2017-12-07 DIAGNOSIS — Z125 Encounter for screening for malignant neoplasm of prostate: Secondary | ICD-10-CM | POA: Diagnosis not present

## 2017-12-07 DIAGNOSIS — E538 Deficiency of other specified B group vitamins: Secondary | ICD-10-CM | POA: Diagnosis not present

## 2017-12-07 DIAGNOSIS — Z79899 Other long term (current) drug therapy: Secondary | ICD-10-CM | POA: Diagnosis not present

## 2017-12-07 DIAGNOSIS — Z0000 Encounter for general adult medical examination without abnormal findings: Secondary | ICD-10-CM | POA: Diagnosis not present

## 2017-12-14 DIAGNOSIS — Z0000 Encounter for general adult medical examination without abnormal findings: Secondary | ICD-10-CM | POA: Diagnosis not present

## 2017-12-14 DIAGNOSIS — E785 Hyperlipidemia, unspecified: Secondary | ICD-10-CM | POA: Diagnosis not present

## 2017-12-14 DIAGNOSIS — I10 Essential (primary) hypertension: Secondary | ICD-10-CM | POA: Diagnosis not present

## 2018-07-29 DIAGNOSIS — J019 Acute sinusitis, unspecified: Secondary | ICD-10-CM | POA: Diagnosis not present

## 2018-07-29 DIAGNOSIS — J984 Other disorders of lung: Secondary | ICD-10-CM | POA: Diagnosis not present

## 2018-07-29 DIAGNOSIS — J40 Bronchitis, not specified as acute or chronic: Secondary | ICD-10-CM | POA: Diagnosis not present

## 2019-12-08 ENCOUNTER — Ambulatory Visit
Payer: No Typology Code available for payment source | Attending: Student in an Organized Health Care Education/Training Program | Admitting: Student in an Organized Health Care Education/Training Program

## 2019-12-08 ENCOUNTER — Encounter: Payer: Self-pay | Admitting: Student in an Organized Health Care Education/Training Program

## 2019-12-08 ENCOUNTER — Other Ambulatory Visit: Payer: Self-pay

## 2019-12-08 VITALS — BP 142/83 | HR 92 | Temp 97.2°F | Resp 18 | Ht 68.0 in | Wt 226.0 lb

## 2019-12-08 DIAGNOSIS — M172 Bilateral post-traumatic osteoarthritis of knee: Secondary | ICD-10-CM | POA: Insufficient documentation

## 2019-12-08 DIAGNOSIS — G894 Chronic pain syndrome: Secondary | ICD-10-CM | POA: Diagnosis present

## 2019-12-08 DIAGNOSIS — M47894 Other spondylosis, thoracic region: Secondary | ICD-10-CM | POA: Diagnosis not present

## 2019-12-08 DIAGNOSIS — M47814 Spondylosis without myelopathy or radiculopathy, thoracic region: Secondary | ICD-10-CM | POA: Insufficient documentation

## 2019-12-08 DIAGNOSIS — M25562 Pain in left knee: Secondary | ICD-10-CM

## 2019-12-08 DIAGNOSIS — G8929 Other chronic pain: Secondary | ICD-10-CM | POA: Diagnosis present

## 2019-12-08 DIAGNOSIS — M47816 Spondylosis without myelopathy or radiculopathy, lumbar region: Secondary | ICD-10-CM | POA: Diagnosis not present

## 2019-12-08 DIAGNOSIS — M25561 Pain in right knee: Secondary | ICD-10-CM | POA: Diagnosis not present

## 2019-12-08 NOTE — Progress Notes (Signed)
Safety precautions to be maintained throughout the outpatient stay will include: orient to surroundings, keep bed in low position, maintain call bell within reach at all times, provide assistance with transfer out of bed and ambulation.  

## 2019-12-08 NOTE — Addendum Note (Signed)
Addended by: Gillis Santa on: 12/08/2019 02:53 PM   Modules accepted: Orders

## 2019-12-08 NOTE — Patient Instructions (Addendum)
1.  It was nice meeting you today.  We will obtain records from [facility] from your previous injections and radiofrequency ablation that you did with Dr. Maryjean Ka.  Afterwards, I will place an order for a repeat radiofrequency ablation at those levels.  2.  Schedule for bilateral knee steroid injections in 1 week  3.  [facility] Trigger Point Injection Trigger points are areas where you have pain. A trigger point injection is a shot given in the trigger point to help relieve pain for a few days to a few months. Common places for trigger points include:  The neck.  The shoulders.  The upper back.  The lower back. A trigger point injection will not cure long-term (chronic) pain permanently. These injections do not always work for every person. For some people, they can help to relieve pain for a few days to a few months. Tell a health care provider about:  Any allergies you have.  All medicines you are taking, including vitamins, herbs, eye drops, creams, and over-the-counter medicines.  Any problems you or family members have had with anesthetic medicines.  Any blood disorders you have.  Any surgeries you have had.  Any medical conditions you have. What are the risks? Generally, this is a safe procedure. However, problems may occur, including:  Infection.  Bleeding or bruising.  Allergic reaction to the injected medicine.  Irritation of the skin around the injection site. What happens before the procedure? Ask your health care provider about:  Changing or stopping your regular medicines. This is especially important if you are taking diabetes medicines or blood thinners.  Taking medicines such as aspirin and ibuprofen. These medicines can thin your blood. Do not take these medicines unless your health care provider tells you to take them.  Taking over-the-counter medicines, vitamins, herbs, and supplements. What  happens during the procedure?   Your health care provider will feel for trigger points. A marker may be used to circle the area for the injection.  The skin over the trigger point will be washed with a germ-killing (antiseptic) solution.  A thin needle is used for the injection. You may feel pain or a twitching feeling when the needle enters the trigger point.  A numbing solution may be injected into the trigger point. Sometimes a medicine to keep down inflammation is also injected.  Your health care provider may move the needle around the area where the trigger point is located until the tightness and twitching goes away.  After the injection, your health care provider may put gentle pressure over the injection site.  The injection site will be covered with a bandage (dressing). The procedure may vary among health care providers and hospitals. What can I expect after treatment? After treatment, you may have:  Soreness and stiffness for 1-2 days.  A dressing. This can be taken off in a few hours or as told by your health care provider. Follow these instructions at home: Injection site care  Remove your dressing as told by your health care provider.  Check your injection site every day for signs of infection. Check for: ? Redness, swelling, or pain. ? Fluid or blood. ? Warmth. ? Pus or a bad smell. Managing pain, stiffness, and swelling  If directed, put ice on the affected area. ? Put ice in a plastic bag. ? Place a towel between your skin and the bag. ? Leave the ice on for 20 minutes, 2-3 times a  day. General instructions  If you were asked to stop your regular medicines, ask your health care provider when you may start taking them again.  Return to your normal activities as told by your health care provider. Ask your health care provider what activities are safe for you.  Do not take baths, swim, or use a hot tub until your health care provider approves.  You may be  asked to see an occupational or physical therapist for exercises that reduce muscle strain and stretch the area of the trigger point.  Keep all follow-up visits as told by your health care provider. This is important. Contact a health care provider if:  Your pain comes back, and it is worse than before the injection. You may need more injections.  You have chills or a fever.  The injection site becomes more painful, red, swollen, or warm to the touch. Summary  A trigger point injection is a shot given in the trigger point to help relieve pain for a few days to a few months.  Common places for trigger point injections are the neck, shoulder, upper back, and lower back.  These injections do not always work for every person, but for some people, the injections can help to relieve pain for a few days to a few months.  Contact a health care provider if symptoms come back or they are worse than before treatment. Also, get help if the injection site becomes more painful, red, swollen, or warm to the touch. This information is not intended to replace advice given to you by your health care provider. Make sure you discuss any questions you have with your health care provider. Document Revised: 05/26/2018 Document Reviewed: 05/26/2018 Elsevier Patient Education  [place].

## 2019-12-08 NOTE — Progress Notes (Addendum)
Patient: Evan Cherry  Service Category: E/M  Provider: Gillis Santa, MD  DOB: 1966-03-04  DOS: 12/08/2019  Referring Provider: Naasir Giovanni, MD  MRN: [id number]  Setting: Ambulatory outpatient  PCP: Jashaun Giovanni, MD  Type: New Patient  Specialty: Interventional Pain Management    Location: Office  Delivery: Face-to-face     Primary Reason(s) for Visit: Encounter for initial evaluation of one or more chronic problems (new to examiner) potentially causing chronic pain, and posing a threat to normal musculoskeletal function. (Level of risk: High) CC: Back Pain (right leg to the knee)  HPI  Evan Cherry is a 54 y.o. year old, male patient, who comes for the first time to our practice referred by Piero Giovanni, MD [address] [place],  [place] [address], for our initial evaluation of his chronic pain. He has Chest pain; Chronic pain syndrome; Bilateral post-traumatic osteoarthritis of knee; Chronic pain of both knees; Thoracic facet joint syndrome; Thoracic spondylosis without myelopathy; and Lumbar spondylosis on their problem list. Today he comes in for evaluation of his Back Pain (right leg to the knee)  Pain Assessment: Location: Right Back Radiating: down the right leg to the foot sometimes, but always to the knee. Onset: More than a month ago Duration: Chronic pain Quality: Burning, Aching, Sharp Severity: 6 /10 (subjective, self-reported pain score)  Effect on ADL: Terrible to try to do much. Timing: Constant Modifying factors: procedures, ice. BP: (!) 142/83  HR: 92  Onset and Duration: Sudden Cause of pain: Work related accident or event Severity: Getting worse, NAS-11 at its worse: 3/10, NAS-11 at its best: 2/10, NAS-11 now: 4/10 and NAS-11 on the average: 4/10 Timing: Morning, Night and After activity or exercise Aggravating Factors: Bending, Climbing, Kneeling, Lifiting, Motion, Stooping , Twisting and Working Alleviating Factors: Cold packs, Hot packs, Medications and  Resting Associated Problems: Numbness, Spasms, Tingling, Pain that wakes patient up and Pain that does not allow patient to sleep Quality of Pain: Burning, Constant, Hot, Sharp, Shooting, Tender, Throbbing and Toothache-like Previous Examinations or Tests: MRI scan, X-rays and Orthopedic evaluation Previous Treatments: Physical Therapy, Radiofrequency, Steroid treatments by mouth and TENS  History of mid thoracic and low back pain.  Chronic in nature.  Worse with exertion.  Related to thoracic and lumbar facet arthropathy.  Has previously had thoracic and lumbar radiofrequency ablations with Dr. Clydell Hakim at [facility] in the past which were effective in reducing his pain and improving his functional status.  Dr. Maryjean Ka has moved.  Patient is interested in repeating thoracic and lumbar radiofrequency ablation for pain management.  Patient also endorses bilateral knee pain.  He has done physical therapy in the past and has failed NSAID therapy.  He received intra-articular knee steroid injections over 10 years ago, he does not recall how effective they were.  Discussed intra-articular knee steroid injection.  Future considerations could include viscosupplementation with intra-articular Hyalgan as well as genicular nerve block and possible genicular nerve radiofrequency ablation.  Of note, patient has his medications managed at the [place] and will continue to do so.  He is being referred here for interventional pain therapies.  Meds   Current Outpatient Medications:  .  acetaminophen (TYLENOL) 500 MG tablet, Take 500 mg by mouth 3 (three) times daily., Disp: , Rfl:  .  albuterol (VENTOLIN HFA) 108 (90 Base) MCG/ACT inhaler, Inhale 2 puffs into the lungs every 6 (six) hours as needed for wheezing or shortness of breath., Disp: , Rfl:  .  benazepril-hydrochlorthiazide (LOTENSIN HCT) 20-12.5 MG per tablet, Take 1 tablet by mouth daily., Disp: 30 tablet, Rfl: 0 .   budesonide-formoterol (SYMBICORT) 160-4.5 MCG/ACT inhaler, Inhale 2 puffs into the lungs in the morning and at bedtime., Disp: , Rfl:  .  cyanocobalamin 1000 MCG tablet, Take 1,000 mcg by mouth daily., Disp: , Rfl:  .  Cyanocobalamin 1000 MCG/ML KIT, Inject 1,000 mcg as directed every 30 (thirty) days., Disp: , Rfl:  .  HYDROcodone-acetaminophen (NORCO/VICODIN) 5-325 MG per tablet, Take 0.5 tablets by mouth in the morning and at bedtime. , Disp: , Rfl:  .  lidocaine (XYLOCAINE) 5 % ointment, Apply 1 application topically 3 (three) times daily as needed., Disp: , Rfl:  .  Multiple Vitamins-Minerals (MULTIVITAMIN PO), Take 1 tablet by mouth daily., Disp: , Rfl:  .  naproxen sodium (ALEVE) 220 MG tablet, Take 220 mg by mouth 2 (two) times daily as needed., Disp: , Rfl:  .  pravastatin (PRAVACHOL) 20 MG tablet, Take 20 mg by mouth daily., Disp: , Rfl:  .  testosterone cypionate (DEPOTESTOTERONE CYPIONATE) 100 MG/ML injection, Inject 150 mg into the muscle every 14 (fourteen) days. For IM use only, Disp: , Rfl:  .  tiotropium (SPIRIVA) 18 MCG inhalation capsule, Place 18 mcg into inhaler and inhale daily., Disp: , Rfl:  .  traMADol (ULTRAM) 50 MG tablet, Take 50 mg by mouth 3 (three) times daily., Disp: , Rfl:  .  meloxicam (MOBIC) 15 MG tablet, Take 15 mg by mouth daily. (Patient not taking: Reported on 12/08/2019), Disp: , Rfl:  .  tiZANidine (ZANAFLEX) 4 MG tablet, Take 4 mg by mouth 3 (three) times daily as needed for muscle spasms. (Patient not taking: Reported on 12/08/2019), Disp: , Rfl:   Imaging Review   MR Knee Left  Wo Contrast  Narrative *RADIOLOGY REPORT*  Clinical Data: Left knee pain and weakness.  Question meniscal tear.  MRI OF THE LEFT KNEE WITHOUT CONTRAST  Technique:  Multiplanar, multisequence MR imaging was performed. No intravenous contrast was administered.  Comparison: None.  Findings: The menisci, anterior and posterior cruciate ligaments, medial and lateral  collateral ligament complexes and extensor mechanism of the knee are all intact.  There is mild hyaline cartilage irregularity at the apex of the patella and along the lateral patellar facet.  Hyaline cartilage surfaces otherwise appear preserved.  No Baker's cyst is identified.  Scant amount of joint fluid is present.  Bone marrow signal is unremarkable.  IMPRESSION:  1.  Negative for meniscal or ligament tear. 2.  Mild chondromalacia patella.  Original Report Authenticated By: [id number]   Complexity Note: Imaging results reviewed. Results shared with Evan Cherry, using Layman's terms.                         ROS  Cardiovascular: High blood pressure Pulmonary or Respiratory: Lung problems, Wheezing and difficulty taking a deep full breath (Asthma), Shortness of breath and Snoring  Neurological: No reported neurological signs or symptoms such as seizures, abnormal skin sensations, urinary and/or fecal incontinence, being born with an abnormal open spine and/or a tethered spinal cord Psychological-Psychiatric: No reported psychological or psychiatric signs or symptoms such as difficulty sleeping, anxiety, depression, delusions or hallucinations (schizophrenial), mood swings (bipolar disorders) or suicidal ideations or attempts Gastrointestinal: Alternating episodes iof diarrhea and constipation (IBS-Irritable bowe syndrome) and Scarred liver (Cirrhosis) Genitourinary: No reported renal or genitourinary signs or symptoms such as difficulty voiding or producing urine, peeing blood, non-functioning kidney,  kidney stones, difficulty emptying the bladder, difficulty controlling the flow of urine, or chronic kidney disease Hematological: No reported hematological signs or symptoms such as prolonged bleeding, low or poor functioning platelets, bruising or bleeding easily, hereditary bleeding problems, low energy levels due to low hemoglobin or being anemic Endocrine: No reported endocrine signs or  symptoms such as high or low blood sugar, rapid heart rate due to high thyroid levels, obesity or weight gain due to slow thyroid or thyroid disease Rheumatologic: Joint aches and or swelling due to excess weight (Osteoarthritis) and Rheumatoid arthritis Musculoskeletal: Negative for myasthenia gravis, muscular dystrophy, multiple sclerosis or malignant hyperthermia Work History: Retired  Allergies  Evan Cherry has No Known Allergies.  Laboratory Chemistry Profile   Renal Lab Results  Component Value Date   BUN 15 12/09/2014   CREATININE 0.82 12/09/2014   GFRAA >60 12/09/2014   GFRNONAA >60 12/09/2014   PROTEINUR Negative 05/02/2014     Electrolytes Lab Results  Component Value Date   NA 142 12/09/2014   K 4.2 12/09/2014   CL 107 12/09/2014   CALCIUM 9.2 12/09/2014     Hepatic Lab Results  Component Value Date   AST 25 12/08/2014   ALT 34 12/08/2014   ALBUMIN 4.4 12/08/2014   ALKPHOS 72 12/08/2014   LIPASE 171 12/26/2011     ID No results found for: LYMEIGGIGMAB, HIV, Winnie, STAPHAUREUS, MRSAPCR, HCVAB, PREGTESTUR, RMSFIGG, QFVRPH1IGG, QFVRPH2IGG, LYMEIGGIGMAB   Bone No results found for: VD25OH, H139778, [id number], [id number], 25OHVITD1, 25OHVITD2, 25OHVITD3, TESTOFREE, TESTOSTERONE   Endocrine Lab Results  Component Value Date   GLUCOSE 92 12/09/2014   GLUCOSEU Negative 05/02/2014     Neuropathy No results found for: VITAMINB12, FOLATE, HGBA1C, HIV   CNS No results found for: COLORCSF, APPEARCSF, RBCCOUNTCSF, WBCCSF, POLYSCSF, LYMPHSCSF, EOSCSF, PROTEINCSF, GLUCCSF, JCVIRUS, CSFOLI, IGGCSF, LABACHR, ACETBL, LABACHR, ACETBL   Inflammation (CRP: Acute  ESR: Chronic) No results found for: CRP, ESRSEDRATE, LATICACIDVEN   Rheumatology No results found for: RF, ANA, LABURIC, URICUR, LYMEIGGIGMAB, LYMEABIGMQN, HLAB27   Coagulation Lab Results  Component Value Date   PLT 233 12/09/2014     Cardiovascular Lab Results  Component Value Date   TROPONINI  <0.03 12/08/2014   HGB 15.8 12/09/2014   HCT 47.6 12/09/2014     Screening No results found for: SARSCOV2NAA, COVIDSOURCE, STAPHAUREUS, MRSAPCR, HCVAB, HIV, PREGTESTUR   Cancer No results found for: CEA, CA125, LABCA2   Allergens No results found for: ALMOND, APPLE, ASPARAGUS, AVOCADO, BANANA, BARLEY, BASIL, BAYLEAF, GREENBEAN, LIMABEAN, WHITEBEAN, BEEFIGE, REDBEET, BLUEBERRY, BROCCOLI, CABBAGE, MELON, CARROT, CASEIN, CASHEWNUT, CAULIFLOWER, CELERY     Note: Lab results reviewed.  [place]  Drug: Mr. Aber  reports no history of drug use. Alcohol:  reports no history of alcohol use. Tobacco:  reports that he has quit smoking. His smokeless tobacco use includes snuff. Medical:  has a past medical history of Asthma, Back pain, COPD (chronic obstructive pulmonary disease) ([place]), Decreased lung capacity, Hypertension, and Sleep apnea. Family: family history is not on file. He was adopted.  History reviewed. No pertinent surgical history. Active Ambulatory Problems    Diagnosis Date Noted  . Chest pain 12/08/2014  . Chronic pain syndrome 12/08/2019  . Bilateral post-traumatic osteoarthritis of knee 12/08/2019  . Chronic pain of both knees 12/08/2019  . Thoracic facet joint syndrome 12/08/2019  . Thoracic spondylosis without myelopathy 12/08/2019  . Lumbar spondylosis 12/08/2019   Resolved Ambulatory Problems    Diagnosis Date Noted  . No Resolved Ambulatory Problems  Past Medical History:  Diagnosis Date  . Asthma   . Back pain   . COPD (chronic obstructive pulmonary disease) ([place])   . Decreased lung capacity   . Hypertension   . Sleep apnea    Constitutional Exam  General appearance: Well nourished, well developed, and well hydrated. In no apparent acute distress Vitals:   12/08/19 0854  BP: (!) 142/83  Pulse: 92  Resp: 18  Temp: (!) 97.2 F (36.2 C)  TempSrc: Temporal  SpO2: 99%  Weight: 226 lb (102.5 kg)  Height: '5\' 8"'  (1.727 m)   BMI Assessment: Estimated body  mass index is 34.36 kg/m as calculated from the following:   Height as of this encounter: '5\' 8"'  (1.727 m).   Weight as of this encounter: 226 lb (102.5 kg).  BMI interpretation table: BMI level Category Range association with higher incidence of chronic pain  <18 kg/m2 Underweight   18.5-24.9 kg/m2 Ideal body weight   25-29.9 kg/m2 Overweight Increased incidence by 20%  30-34.9 kg/m2 Obese (Class I) Increased incidence by 68%  35-39.9 kg/m2 Severe obesity (Class II) Increased incidence by 136%  >40 kg/m2 Extreme obesity (Class III) Increased incidence by 254%   Patient's current BMI Ideal Body weight  Body mass index is 34.36 kg/m. Ideal body weight: 68.4 kg (150 lb 12.7 oz) Adjusted ideal body weight: 82 kg (180 lb 14 oz)   BMI Readings from Last 4 Encounters:  12/08/19 34.36 kg/m  12/08/14 33.91 kg/m   Wt Readings from Last 4 Encounters:  12/08/19 226 lb (102.5 kg)  12/08/14 223 lb (101.2 kg)    Psych/Mental status: Alert, oriented x 3 (person, place, & time)       Eyes: PERLA Respiratory: No evidence of acute respiratory distress  Cervical Spine Exam  Skin & Axial Inspection: No masses, redness, edema, swelling, or associated skin lesions Alignment: Symmetrical Functional ROM: Unrestricted ROM      Stability: No instability detected Muscle Tone/Strength: Functionally intact. No obvious neuro-muscular anomalies detected. Sensory (Neurological): Unimpaired Palpation: No palpable anomalies              Upper Extremity (UE) Exam    Side: Right upper extremity  Side: Left upper extremity  Skin & Extremity Inspection: Skin color, temperature, and hair growth are WNL. No peripheral edema or cyanosis. No masses, redness, swelling, asymmetry, or associated skin lesions. No contractures.  Skin & Extremity Inspection: Skin color, temperature, and hair growth are WNL. No peripheral edema or cyanosis. No masses, redness, swelling, asymmetry, or associated skin lesions. No  contractures.  Functional ROM: Unrestricted ROM          Functional ROM: Unrestricted ROM          Muscle Tone/Strength: Functionally intact. No obvious neuro-muscular anomalies detected.   Muscle Tone/Strength: Functionally intact. No obvious neuro-muscular anomalies detected.  Sensory (Neurological): Unimpaired          Sensory (Neurological): Unimpaired          Palpation: No palpable anomalies              Palpation: No palpable anomalies              Provocative Test(s):  Phalen's test: deferred Tinel's test: deferred Apley's scratch test (touch opposite shoulder):  Action 1 (Across chest): deferred Action 2 (Overhead): deferred Action 3 (LB reach): deferred   Provocative Test(s):  Phalen's test: deferred Tinel's test: deferred Apley's scratch test (touch opposite shoulder):  Action 1 (Across chest): deferred Action 2 (  Overhead): deferred Action 3 (LB reach): deferred    Thoracic Spine Area Exam  Skin & Axial Inspection: No masses, redness, or swelling Alignment: Symmetrical Functional ROM: Unrestricted ROM Stability: No instability detected Muscle Tone/Strength: Functionally intact. No obvious neuro-muscular anomalies detected. Sensory (Neurological): Musculoskeletal pain pattern Muscle strength & Tone: No palpable anomalies  Lumbar Exam  Skin & Axial Inspection: No masses, redness, or swelling Alignment: Symmetrical Functional ROM: Pain restricted ROM       Stability: No instability detected Muscle Tone/Strength: Functionally intact. No obvious neuro-muscular anomalies detected. Sensory (Neurological): Musculoskeletal pain pattern Palpation: No palpable anomalies       Provocative Tests: Hyperextension/rotation test: (+) bilaterally for facet joint pain. Lumbar quadrant test (Kemp's test): (+) bilaterally for facet joint pain. Lateral bending test: deferred today       Patrick's Maneuver: deferred today                   FABER* test: deferred today                    S-I anterior distraction/compression test: deferred today         S-I lateral compression test: deferred today         S-I Thigh-thrust test: deferred today         S-I Gaenslen's test: deferred today         *(Flexion, ABduction and External Rotation)  Gait & Posture Assessment  Ambulation: Unassisted Gait: Relatively normal for age and body habitus Posture: WNL   Lower Extremity Exam    Side: Right lower extremity  Side: Left lower extremity  Stability: No instability observed          Stability: No instability observed          Skin & Extremity Inspection: Skin color, temperature, and hair growth are WNL. No peripheral edema or cyanosis. No masses, redness, swelling, asymmetry, or associated skin lesions. No contractures.  Skin & Extremity Inspection: Skin color, temperature, and hair growth are WNL. No peripheral edema or cyanosis. No masses, redness, swelling, asymmetry, or associated skin lesions. No contractures.  Functional ROM: Unrestricted ROM                  Functional ROM: Unrestricted ROM                  Muscle Tone/Strength: Functionally intact. No obvious neuro-muscular anomalies detected.  Muscle Tone/Strength: Functionally intact. No obvious neuro-muscular anomalies detected.  Sensory (Neurological): Arthropathic arthralgia        Sensory (Neurological): Arthropathic arthralgia        DTR: Patellar: deferred today Achilles: deferred today Plantar: deferred today  DTR: Patellar: deferred today Achilles: deferred today Plantar: deferred today  Palpation: No palpable anomalies  Palpation: No palpable anomalies   Assessment  Primary Diagnosis & Pertinent Problem List: The primary encounter diagnosis was Lumbar spondylosis. Diagnoses of Lumbar facet arthropathy, Thoracic spondylosis without myelopathy, Thoracic facet joint syndrome, Chronic pain of both knees, Bilateral post-traumatic osteoarthritis of knee, and Chronic pain syndrome were also pertinent to this  visit.  Visit Diagnosis (New problems to examiner): 1. Lumbar spondylosis   2. Lumbar facet arthropathy   3. Thoracic spondylosis without myelopathy   4. Thoracic facet joint syndrome   5. Chronic pain of both knees   6. Bilateral post-traumatic osteoarthritis of knee   7. Chronic pain syndrome    Plan of Care (Initial workup plan)   Procedure Orders  KNEE INJECTION  We will obtain records from Dr. Maryjean Ka regarding procedural details of patient's previous thoracic and lumbar radiofrequency ablation to determine levels.  We will repeat here once we obtain records from Dr. Maryjean Ka. Addendum: Received records from Dr. Maryjean Ka office which shows that patient previously had his thoracic facet medial branch radiofrequency ablation performed on March 05, 2017 at T10, T11, T12.  Given that there has been greater than 2 years since his last RFA, we will start with diagnostic blocks before proceeding with RFA.  Plan for bilateral knee intra-articular steroid injection for knee osteoarthritis [facility]    Provider-requested follow-up: Return in about 1 week (around 12/15/2019) for Bilateral knee steroid.  Future Appointments  Date Time Provider Center  12/12/2019 10:00 AM Gillis Santa, MD [facility] None    Note by: Gillis Santa, MD Date: 12/08/2019; Time: 10:46 AM

## 2019-12-12 ENCOUNTER — Other Ambulatory Visit: Payer: Self-pay

## 2019-12-12 ENCOUNTER — Ambulatory Visit
Payer: No Typology Code available for payment source | Attending: Student in an Organized Health Care Education/Training Program | Admitting: Student in an Organized Health Care Education/Training Program

## 2019-12-12 ENCOUNTER — Encounter: Payer: Self-pay | Admitting: Student in an Organized Health Care Education/Training Program

## 2019-12-12 VITALS — BP 144/72 | HR 87 | Temp 98.4°F | Ht 68.0 in | Wt 226.0 lb

## 2019-12-12 DIAGNOSIS — M172 Bilateral post-traumatic osteoarthritis of knee: Secondary | ICD-10-CM

## 2019-12-12 DIAGNOSIS — M25561 Pain in right knee: Secondary | ICD-10-CM

## 2019-12-12 DIAGNOSIS — M25562 Pain in left knee: Secondary | ICD-10-CM | POA: Diagnosis not present

## 2019-12-12 DIAGNOSIS — G8929 Other chronic pain: Secondary | ICD-10-CM | POA: Diagnosis not present

## 2019-12-12 MED ORDER — METHYLPREDNISOLONE ACETATE 40 MG/ML IJ SUSP
40.0000 mg | Freq: Once | INTRAMUSCULAR | Status: AC
  Administered 2019-12-12: 40 mg via INTRA_ARTICULAR

## 2019-12-12 MED ORDER — METHYLPREDNISOLONE ACETATE 40 MG/ML IJ SUSP
INTRAMUSCULAR | Status: AC
  Filled 2019-12-12: qty 2

## 2019-12-12 MED ORDER — LIDOCAINE HCL 2 % IJ SOLN
20.0000 mL | Freq: Once | INTRAMUSCULAR | Status: AC
  Administered 2019-12-12: 400 mg

## 2019-12-12 MED ORDER — LIDOCAINE HCL 2 % IJ SOLN
INTRAMUSCULAR | Status: AC
  Filled 2019-12-12: qty 20

## 2019-12-12 NOTE — Patient Instructions (Addendum)
Please schedule patient for bilateral T10, T11, T12 thoracic facet medial branch nerve block.  This order was placed at his last clinic visit.  See clinic note.  ____________________________________________________________________________________________  Post-Procedure Discharge Instructions  Instructions:  Apply ice:   Purpose: This will minimize any swelling and discomfort after procedure.   When: Day of procedure, as soon as you get home.  How: Fill a plastic sandwich bag with crushed ice. Cover it with a small towel and apply to injection site.  How long: (15 min on, 15 min off) Apply for 15 minutes then remove x 15 minutes.  Repeat sequence on day of procedure, until you go to bed.  Apply heat:   Purpose: To treat any soreness and discomfort from the procedure.  When: Starting the next day after the procedure.  How: Apply heat to procedure site starting the day following the procedure.  How long: May continue to repeat daily, until discomfort goes away.  Food intake: Start with clear liquids (like water) and advance to regular food, as tolerated.   Physical activities: Keep activities to a minimum for the first 8 hours after the procedure. After that, then as tolerated.  Driving: If you have received any sedation, be responsible and do not drive. You are not allowed to drive for 24 hours after having sedation.  Blood thinner: (Applies only to those taking blood thinners) You may restart your blood thinner 6 hours after your procedure.  Insulin: (Applies only to Diabetic patients taking insulin) As soon as you can eat, you may resume your normal dosing schedule.  Infection prevention: Keep procedure site clean and dry. Shower daily and clean area with soap and water.  Post-procedure Pain Diary: Extremely important that this be done correctly and accurately. Recorded information will be used to determine the next step in treatment. For the purpose of accuracy, follow these  rules:  Evaluate only the area treated. Do not report or include pain from an untreated area. For the purpose of this evaluation, ignore all other areas of pain, except for the treated area.  After your procedure, avoid taking a long nap and attempting to complete the pain diary after you wake up. Instead, set your alarm clock to go off every hour, on the hour, for the initial 8 hours after the procedure. Document the duration of the numbing medicine, and the relief you are getting from it.  Do not go to sleep and attempt to complete it later. It will not be accurate. If you received sedation, it is likely that you were given a medication that may cause amnesia. Because of this, completing the diary at a later time may cause the information to be inaccurate. This information is needed to plan your care.  Follow-up appointment: Keep your post-procedure follow-up evaluation appointment after the procedure (usually 2 weeks for most procedures, 6 weeks for radiofrequencies). DO NOT FORGET to bring you pain diary with you.   Expect: (What should I expect to see with my procedure?)  From numbing medicine (AKA: Local Anesthetics): Numbness or decrease in pain. You may also experience some weakness, which if present, could last for the duration of the local anesthetic.  Onset: Full effect within 15 minutes of injected.  Duration: It will depend on the type of local anesthetic used. On the average, 1 to 8 hours.   From steroids (Applies only if steroids were used): Decrease in swelling or inflammation. Once inflammation is improved, relief of the pain will follow.  Onset of  benefits: Depends on the amount of swelling present. The more swelling, the longer it will take for the benefits to be seen. In some cases, up to 10 days.  Duration: Steroids will stay in the system x 2 weeks. Duration of benefits will depend on multiple posibilities including persistent irritating factors.  Side-effects: If  present, they may typically last 2 weeks (the duration of the steroids).  Frequent: Cramps (if they occur, drink Gatorade and take over-the-counter Magnesium 450-500 mg once to twice a day); water retention with temporary weight gain; increases in blood sugar; decreased immune system response; increased appetite.  Occasional: Facial flushing (red, warm cheeks); mood swings; menstrual changes.  Uncommon: Long-term decrease or suppression of natural hormones; bone thinning. (These are more common with higher doses or more frequent use. This is why we prefer that our patients avoid having any injection therapies in other practices.)   Very Rare: Severe mood changes; psychosis; aseptic necrosis.  From procedure: Some discomfort is to be expected once the numbing medicine wears off. This should be minimal if ice and heat are applied as instructed.  Call if: (When should I call?)  You experience numbness and weakness that gets worse with time, as opposed to wearing off.  New onset bowel or bladder incontinence. (Applies only to procedures done in the spine)  Emergency Numbers:  Durning business hours (Monday - Thursday, 8:00 AM - 4:00 PM) (Friday, 9:00 AM - 12:00 Noon): [phone]  After hours: [phone]  NOTE: If you are having a problem and are unable connect with, or to talk to a provider, then go to your nearest urgent care or emergency department. If the problem is serious and urgent, please call 911. ____________________________________________________________________________________________

## 2019-12-12 NOTE — Progress Notes (Signed)
Safety precautions to be maintained throughout the outpatient stay will include: orient to surroundings, keep bed in low position, maintain call bell within reach at all times, provide assistance with transfer out of bed and ambulation.  

## 2019-12-12 NOTE — Progress Notes (Signed)
PROVIDER NOTE: Information contained herein reflects review and annotations entered in association with encounter. Interpretation of such information and data should be left to medically-trained personnel. Information provided to patient can be located elsewhere in the medical record under "Patient Instructions". Document created using STT-dictation technology, any transcriptional errors that may result from process are unintentional.    Patient: Evan Cherry  Service Category: Procedure  Provider: Gillis Santa, MD  DOB: [birth year]  DOS: 12/12/2019  Location: [facility]  MRN: [id number]  Setting: Ambulatory - outpatient  Referring Provider: Rhone Giovanni, MD  Type: Established Patient  Specialty: Interventional Pain Management  PCP: Riely Giovanni, MD   Primary Reason for Visit: Interventional Pain Management Treatment. CC: Knee Pain  Procedure:          Anesthesia, Analgesia, Anxiolysis:  Type: Diagnostic Intra-Articular Local anesthetic and steroid Knee Injection #1  Region: Medial infrapatellar Knee Region Level: Knee Joint Laterality: Bilateral  Type: Local Anesthesia Indication(s): Analgesia         Local Anesthetic: Lidocaine 1-2% Route: Infiltration ([place]/IM) IV Access: Declined Sedation: Declined   Position: Sitting   Indications: 1. Chronic pain of both knees   2. Bilateral post-traumatic osteoarthritis of knee    Pain Score: Pre-procedure: 5 /10 Post-procedure: 5 /10   Pre-op Assessment:  Evan Cherry is a 54 y.o. (year old), male patient, seen today for interventional treatment. He  has no past surgical history on file. Evan Cherry has a current medication list which includes the following prescription(s): acetaminophen, albuterol, benazepril-hydrochlorthiazide, budesonide-formoterol, cyanocobalamin, cyanocobalamin, hydrocodone-acetaminophen, lidocaine, meloxicam, multiple vitamin, naproxen sodium, pravastatin, testosterone cypionate, tiotropium, tramadol,  and tizanidine. His primarily concern today is the Knee Pain  Initial Vital Signs:  Pulse/HCG Rate: 87  Temp: 98.4 F (36.9 C) Resp:   BP: (!) 144/72 SpO2: 99 %  BMI: Estimated body mass index is 34.36 kg/m as calculated from the following:   Height as of this encounter: 5\' 8"  (1.727 m).   Weight as of this encounter: 226 lb (102.5 kg).  Risk Assessment: Allergies: Reviewed. He has No Known Allergies.  Allergy Precautions: None required Coagulopathies: Reviewed. None identified.  Blood-thinner therapy: None at this time Active Infection(s): Reviewed. None identified. Evan Cherry is afebrile  Site Confirmation: Evan Cherry was asked to confirm the procedure and laterality before marking the site Procedure checklist: Completed Consent: Before the procedure and under the influence of no sedative(s), amnesic(s), or anxiolytics, the patient was informed of the treatment options, risks and possible complications. To fulfill our ethical and legal obligations, as recommended by the American Medical Association's Code of Ethics, I have informed the patient of my clinical impression; the nature and purpose of the treatment or procedure; the risks, benefits, and possible complications of the intervention; the alternatives, including doing nothing; the risk(s) and benefit(s) of the alternative treatment(s) or procedure(s); and the risk(s) and benefit(s) of doing nothing. The patient was provided information about the general risks and possible complications associated with the procedure. These may include, but are not limited to: failure to achieve desired goals, infection, bleeding, organ or nerve damage, allergic reactions, paralysis, and death. In addition, the patient was informed of those risks and complications associated to the procedure, such as failure to decrease pain; infection; bleeding; organ or nerve damage with subsequent damage to sensory, motor, and/or autonomic systems, resulting in  permanent pain, numbness, and/or weakness of one or several areas of the body; allergic reactions; (i.e.: anaphylactic reaction); and/or death. Furthermore, the patient  was informed of those risks and complications associated with the medications. These include, but are not limited to: allergic reactions (i.e.: anaphylactic or anaphylactoid reaction(s)); adrenal axis suppression; blood sugar elevation that in diabetics may result in ketoacidosis or comma; water retention that in patients with history of congestive heart failure may result in shortness of breath, pulmonary edema, and decompensation with resultant heart failure; weight gain; swelling or edema; medication-induced neural toxicity; particulate matter embolism and blood vessel occlusion with resultant organ, and/or nervous system infarction; and/or aseptic necrosis of one or more joints. Finally, the patient was informed that Medicine is not an exact science; therefore, there is also the possibility of unforeseen or unpredictable risks and/or possible complications that may result in a catastrophic outcome. The patient indicated having understood very clearly. We have given the patient no guarantees and we have made no promises. Enough time was given to the patient to ask questions, all of which were answered to the patient's satisfaction. Evan Cherry has indicated that he wanted to continue with the procedure. Attestation: I, the ordering provider, attest that I have discussed with the patient the benefits, risks, side-effects, alternatives, likelihood of achieving goals, and potential problems during recovery for the procedure that I have provided informed consent. Date  Time: 12/12/2019  9:42 AM  Pre-Procedure Preparation:  Monitoring: As per clinic protocol. Respiration, ETCO2, SpO2, BP, heart rate and rhythm monitor placed and checked for adequate function Safety Precautions: Patient was assessed for positional comfort and pressure points before  starting the procedure. Time-out: I initiated and conducted the "Time-out" before starting the procedure, as per protocol. The patient was asked to participate by confirming the accuracy of the "Time Out" information. Verification of the correct person, site, and procedure were performed and confirmed by me, the nursing staff, and the patient. "Time-out" conducted as per Joint Commission's Universal Protocol (UP.01.01.01). Time: 1001  Description of Procedure:          Target Area: Knee Joint Approach: Just above the Medial tibial plateau, lateral to the infrapatellar tendon. Area Prepped: Entire knee area, from the mid-thigh to the mid-shin. DuraPrep (Iodine Povacrylex [0.7% available iodine] and Isopropyl Alcohol, 74% w/w) Safety Precautions: Aspiration looking for blood return was conducted prior to all injections. At no point did we inject any substances, as a needle was being advanced. No attempts were made at seeking any paresthesias. Safe injection practices and needle disposal techniques used. Medications properly checked for expiration dates. SDV (single dose vial) medications used. Description of the Procedure: Protocol guidelines were followed. The patient was placed in position over the fluoroscopy table. The target area was identified and the area prepped in the usual manner. Skin & deeper tissues infiltrated with local anesthetic. Appropriate amount of time allowed to pass for local anesthetics to take effect. The procedure needles were then advanced to the target area. Proper needle placement secured. Negative aspiration confirmed. Solution injected in intermittent fashion, asking for systemic symptoms every 0.5cc of injectate. The needles were then removed and the area cleansed, making sure to leave some of the prepping solution back to take advantage of its long term bactericidal properties. Vitals:   12/12/19 0945  BP: (!) 144/72  Pulse: 87  Temp: 98.4 F (36.9 C)  SpO2: 99%   Weight: 226 lb (102.5 kg)  Height: 5\' 8"  (1.727 m)    Start Time: 1001 hrs. End Time: 1006 hrs. Materials:  Needle(s) Type: Regular needle Gauge: 25G Length: 1.5-in Medication(s): Please see orders for medications and  dosing details. 5 cc solution made of 4 cc of 0.2% ropivacaine, 1 cc of methylprednisolone, 40 mg/cc.  5 cc injected into left intra-articular knee 5 cc solution made of 4 cc of 0.2% ropivacaine, 1 cc of methylprednisolone, 40 mg/cc.  5 cc injected into right intra-articular knee  Imaging Guidance:          Type of Imaging Technique: None used Indication(s): N/A Exposure Time: No patient exposure Contrast: None used. Fluoroscopic Guidance: N/A Ultrasound Guidance: N/A Interpretation: N/A  Antibiotic Prophylaxis:   Anti-infectives (From admission, onward)   None     Indication(s): None identified  Post-operative Assessment:  Post-procedure Vital Signs:  Pulse/HCG Rate: 87  Temp: 98.4 F (36.9 C) Resp:   BP: (!) 144/72 SpO2: 99 %  EBL: None  Complications: No immediate post-treatment complications observed by team, or reported by patient.  Note: The patient tolerated the entire procedure well. A repeat set of vitals were taken after the procedure and the patient was kept under observation following institutional policy, for this type of procedure. Post-procedural neurological assessment was performed, showing return to baseline, prior to discharge. The patient was provided with post-procedure discharge instructions, including a section on how to identify potential problems. Should any problems arise concerning this procedure, the patient was given instructions to immediately contact us, at any time, without hesitation. In any case, we plan to contact the patient by telephone for a follow-up status report regarding this interventional procedure.  Comments:  No additional relevant information.  Plan of Care   Medications ordered for procedure: Meds  ordered this encounter  Medications  . methylPREDNISolone acetate (DEPO-MEDROL) injection 40 mg  . methylPREDNISolone acetate (DEPO-MEDROL) injection 40 mg  . lidocaine (XYLOCAINE) 2 % (with pres) injection 400 mg   Medications administered: We administered methylPREDNISolone acetate, methylPREDNISolone acetate, and lidocaine.  See the medical record for exact dosing, route, and time of administration.  Follow-up plan:   Return in about 4 weeks (around 01/09/2020) for Post Procedure Evaluation, virtual.     B/L IA Knee steroid 12/12/19, plan for T10, 11, 12 MBB followed by RFA (previosly done with Dr Maryjean Ka in 2019)   Recent Visits Date Type Provider Dept  12/08/19 Office Visit Gillis Santa, MD [facility]  Showing recent visits within past 90 days and meeting all other requirements Today's Visits Date Type Provider Dept  12/12/19 Procedure visit Gillis Santa, MD [facility]  Showing today's visits and meeting all other requirements Future Appointments Date Type Provider Dept  01/12/20 Appointment Gillis Santa, MD [facility]  Showing future appointments within next 90 days and meeting all other requirements  Disposition: Discharge home  Discharge (Date  Time): 12/12/2019; 1007 hrs.   Primary Care Physician: Mansfield Giovanni, MD Location: [facility] Note by: Gillis Santa, MD Date: 12/12/2019; Time: 10:30 AM  Disclaimer:  Medicine is not an exact science. The only guarantee in medicine is that nothing is guaranteed. It is important to note that the decision to proceed with this intervention was based on the information collected from the patient. The Data and conclusions were drawn from the patient's questionnaire, the interview, and the physical examination. Because the information was provided in large part by the patient, it cannot be guaranteed that it has not been purposely or unconsciously manipulated. Every  effort has been made to obtain as much relevant data as possible for this evaluation. It is important to note that the conclusions that lead to this  procedure are derived in large part from the available data. Always take into account that the treatment will also be dependent on availability of resources and existing treatment guidelines, considered by other Pain Management Practitioners as being common knowledge and practice, at the time of the intervention. For Medico-Legal purposes, it is also important to point out that variation in procedural techniques and pharmacological choices are the acceptable norm. The indications, contraindications, technique, and results of the above procedure should only be interpreted and judged by a Board-Certified Interventional Pain Specialist with extensive familiarity and expertise in the same exact procedure and technique.

## 2019-12-19 ENCOUNTER — Other Ambulatory Visit: Payer: Self-pay

## 2019-12-19 ENCOUNTER — Encounter: Payer: Self-pay | Admitting: Student in an Organized Health Care Education/Training Program

## 2019-12-19 ENCOUNTER — Ambulatory Visit (HOSPITAL_BASED_OUTPATIENT_CLINIC_OR_DEPARTMENT_OTHER)
Payer: No Typology Code available for payment source | Admitting: Student in an Organized Health Care Education/Training Program

## 2019-12-19 ENCOUNTER — Ambulatory Visit
Admission: RE | Admit: 2019-12-19 | Discharge: 2019-12-19 | Disposition: A | Discharge status: Home / Self Care | Payer: No Typology Code available for payment source | Source: Ambulatory Visit | Attending: Student in an Organized Health Care Education/Training Program | Admitting: Student in an Organized Health Care Education/Training Program

## 2019-12-19 VITALS — BP 149/94 | HR 82 | Temp 97.9°F | Resp 16 | Ht 68.0 in | Wt 226.0 lb

## 2019-12-19 DIAGNOSIS — G894 Chronic pain syndrome: Secondary | ICD-10-CM | POA: Insufficient documentation

## 2019-12-19 DIAGNOSIS — M47894 Other spondylosis, thoracic region: Secondary | ICD-10-CM | POA: Diagnosis not present

## 2019-12-19 DIAGNOSIS — M47814 Spondylosis without myelopathy or radiculopathy, thoracic region: Secondary | ICD-10-CM | POA: Diagnosis not present

## 2019-12-19 MED ORDER — ROPIVACAINE HCL 2 MG/ML IJ SOLN
9.0000 mL | Freq: Once | INTRAMUSCULAR | Status: AC
  Administered 2019-12-19: 9 mL via INTRA_ARTICULAR

## 2019-12-19 MED ORDER — ROPIVACAINE HCL 2 MG/ML IJ SOLN
9.0000 mL | Freq: Once | INTRAMUSCULAR | Status: AC
  Administered 2019-12-19: 9 mL via PERINEURAL

## 2019-12-19 MED ORDER — ROPIVACAINE HCL 2 MG/ML IJ SOLN
INTRAMUSCULAR | Status: AC
  Filled 2019-12-19: qty 10

## 2019-12-19 MED ORDER — LIDOCAINE HCL 2 % IJ SOLN
INTRAMUSCULAR | Status: AC
  Filled 2019-12-19: qty 20

## 2019-12-19 MED ORDER — DEXAMETHASONE SODIUM PHOSPHATE 10 MG/ML IJ SOLN
10.0000 mg | Freq: Once | INTRAMUSCULAR | Status: AC
  Administered 2019-12-19: 10 mg

## 2019-12-19 MED ORDER — DEXAMETHASONE SODIUM PHOSPHATE 10 MG/ML IJ SOLN
INTRAMUSCULAR | Status: AC
  Filled 2019-12-19: qty 1

## 2019-12-19 MED ORDER — LIDOCAINE HCL 2 % IJ SOLN
20.0000 mL | Freq: Once | INTRAMUSCULAR | Status: AC
  Administered 2019-12-19: 400 mg

## 2019-12-19 NOTE — Progress Notes (Signed)
Safety precautions to be maintained throughout the outpatient stay will include: orient to surroundings, keep bed in low position, maintain call bell within reach at all times, provide assistance with transfer out of bed and ambulation.  

## 2019-12-19 NOTE — Patient Instructions (Signed)

## 2019-12-19 NOTE — Progress Notes (Signed)
PROVIDER NOTE: Information contained herein reflects review and annotations entered in association with encounter. Interpretation of such information and data should be left to medically-trained personnel. Information provided to patient can be located elsewhere in the medical record under "Patient Instructions". Document created using STT-dictation technology, any transcriptional errors that may result from process are unintentional.    Patient: Evan Cherry  Service Category: Procedure  Provider: Gillis Santa, MD  DOB: October 05, 1965  DOS: 12/19/2019  Location: [facility]  MRN: [id number]  Setting: Ambulatory - outpatient  Referring Provider: Francis Giovanni, MD  Type: Established Patient  Specialty: Interventional Pain Management  PCP: Khamani Giovanni, MD   Primary Reason for Visit: Interventional Pain Management Treatment. CC: Back Pain (thoracic)  Procedure:          Anesthesia, Analgesia, Anxiolysis:  Type: Therapeutic Medial Branch Facet Block  #1  Region:Thoracic Level: T10, T11, T12 medial Branch Level(s) Laterality: Bilateral  Type: Local Anesthesia  Local Anesthetic: Lidocaine 1-2%  Position: Prone   Indications: 1. Thoracic spondylosis without myelopathy   2. Thoracic facet joint syndrome   3. Chronic pain syndrome    Of note, patient is status post thoracic facet medial branch RFA at T10, T11, T12 with Dr. Maryjean Ka on March 15, 2017.  Given that Dr. Maryjean Ka has moved, patient has transferred care here.  He is having return of his thoracic pain and so plan for diagnostic/therapeutic facet medial branch nerve block followed by RFA.  Pain Score: Pre-procedure: 5 /10 Post-procedure: 5 /10   Pre-op Assessment:  Evan Cherry is a 54 y.o. (year old), male patient, seen today for interventional treatment. He  has no past surgical history on file. Evan Cherry has a current medication list which includes the following prescription(s): acetaminophen, albuterol,  benazepril-hydrochlorthiazide, budesonide-formoterol, cyanocobalamin, cyanocobalamin, hydrocodone-acetaminophen, lidocaine, meloxicam, multiple vitamin, naproxen sodium, pravastatin, testosterone cypionate, tiotropium, tizanidine, and tramadol. His primarily concern today is the Back Pain (thoracic)  Initial Vital Signs:  Pulse/HCG Rate: 98  Temp: 97.9 F (36.6 C) Resp: 16 BP: 139/75 SpO2: 99 %  BMI: Estimated body mass index is 34.36 kg/m as calculated from the following:   Height as of this encounter: 5\' 8"  (1.727 m).   Weight as of this encounter: 226 lb (102.5 kg).  Risk Assessment: Allergies: Reviewed. He has No Known Allergies.  Allergy Precautions: None required Coagulopathies: Reviewed. None identified.  Blood-thinner therapy: None at this time Active Infection(s): Reviewed. None identified. Evan Cherry is afebrile  Site Confirmation: Evan Cherry was asked to confirm the procedure and laterality before marking the site Procedure checklist: Completed Consent: Before the procedure and under the influence of no sedative(s), amnesic(s), or anxiolytics, the patient was informed of the treatment options, risks and possible complications. To fulfill our ethical and legal obligations, as recommended by the American Medical Association's Code of Ethics, I have informed the patient of my clinical impression; the nature and purpose of the treatment or procedure; the risks, benefits, and possible complications of the intervention; the alternatives, including doing nothing; the risk(s) and benefit(s) of the alternative treatment(s) or procedure(s); and the risk(s) and benefit(s) of doing nothing. The patient was provided information about the general risks and possible complications associated with the procedure. These may include, but are not limited to: failure to achieve desired goals, infection, bleeding, organ or nerve damage, allergic reactions, paralysis, and death. In addition, the patient was  informed of those risks and complications associated to Spine-related procedures, such as failure to decrease  pain; infection (i.e.: Meningitis, epidural or intraspinal abscess); bleeding (i.e.: epidural hematoma, subarachnoid hemorrhage, or any other type of intraspinal or peri-dural bleeding); organ or nerve damage (i.e.: Any type of peripheral nerve, nerve root, or spinal cord injury) with subsequent damage to sensory, motor, and/or autonomic systems, resulting in permanent pain, numbness, and/or weakness of one or several areas of the body; allergic reactions; (i.e.: anaphylactic reaction); and/or death. Furthermore, the patient was informed of those risks and complications associated with the medications. These include, but are not limited to: allergic reactions (i.e.: anaphylactic or anaphylactoid reaction(s)); adrenal axis suppression; blood sugar elevation that in diabetics may result in ketoacidosis or comma; water retention that in patients with history of congestive heart failure may result in shortness of breath, pulmonary edema, and decompensation with resultant heart failure; weight gain; swelling or edema; medication-induced neural toxicity; particulate matter embolism and blood vessel occlusion with resultant organ, and/or nervous system infarction; and/or aseptic necrosis of one or more joints. Finally, the patient was informed that Medicine is not an exact science; therefore, there is also the possibility of unforeseen or unpredictable risks and/or possible complications that may result in a catastrophic outcome. The patient indicated having understood very clearly. We have given the patient no guarantees and we have made no promises. Enough time was given to the patient to ask questions, all of which were answered to the patient's satisfaction. Evan Cherry has indicated that he wanted to continue with the procedure. Attestation: I, the ordering provider, attest that I have discussed with the  patient the benefits, risks, side-effects, alternatives, likelihood of achieving goals, and potential problems during recovery for the procedure that I have provided informed consent. Date  Time: 12/19/2019 10:37 AM  Pre-Procedure Preparation:  Monitoring: As per clinic protocol. Respiration, ETCO2, SpO2, BP, heart rate and rhythm monitor placed and checked for adequate function Safety Precautions: Patient was assessed for positional comfort and pressure points before starting the procedure. Time-out: I initiated and conducted the "Time-out" before starting the procedure, as per protocol. The patient was asked to participate by confirming the accuracy of the "Time Out" information. Verification of the correct person, site, and procedure were performed and confirmed by me, the nursing staff, and the patient. "Time-out" conducted as per Joint Commission's Universal Protocol (UP.01.01.01). Time: 1116  Description of Procedure:          Target Area: The target area is a superior and lateral portion of the thoracic transverse processes more distal to the joint itself then and the lumbar region. Approach: Paraspinal approach. Area Prepped: Entire Posterior Thoracic Region DuraPrep (Iodine Povacrylex [0.7% available iodine] and Isopropyl Alcohol, 74% w/w) Safety Precautions: Aspiration looking for blood return was conducted prior to all injections. At no point did we inject any substances, as a needle was being advanced. No attempts were made at seeking any paresthesias. Safe injection practices and needle disposal techniques used. Medications properly checked for expiration dates. SDV (single dose vial) medications used. Description of the Procedure: Protocol guidelines were followed. The patient was placed in position over the fluoroscopy table. The target area was identified and the area prepped in the usual manner. Skin & deeper tissues infiltrated with local anesthetic. Appropriate amount of time  allowed to pass for local anesthetics to take effect. The procedure needles were then advanced to the target area. Proper needle placement secured. Negative aspiration confirmed. Solution injected in intermittent fashion, asking for systemic symptoms every 0.5cc of injectate. The needles were then removed and the area  cleansed, making sure to leave some of the prepping solution back to take advantage of its long term bactericidal properties.  10 cc solution made of 8 cc of 0.2% ropivacaine, 2 cc of Decadron 10 mg/cc.  1.5 to 2 cc injected at each level above bilaterally for the T10, T11, T12 medial branch        Vitals:   12/19/19 1044 12/19/19 1110 12/19/19 1120 12/19/19 1130  BP: 139/75 (!) 157/93 (!) 150/97 (!) 149/94  Pulse: 98 82 79 82  Resp: 16 18 16 16   Temp: 97.9 F (36.6 C)     TempSrc: Temporal     SpO2: 99% 99% 98% 98%  Weight: 226 lb (102.5 kg)     Height: 5\' 8"  (1.727 m)       Start Time: 1116 hrs. End Time: 1129 hrs. Imaging Guidance (Spinal):          Type of Imaging Technique: Fluoroscopy Guidance (Spinal) Indication(s): Assistance in needle guidance and placement for procedures requiring needle placement in or near specific anatomical locations not easily accessible without such assistance. Exposure Time: Please see nurses notes. Contrast: None used. Fluoroscopic Guidance: I was personally present during the use of fluoroscopy. "Tunnel Vision Technique" used to obtain the best possible view of the target area. Parallax error corrected before commencing the procedure. "Direction-depth-direction" technique used to introduce the needle under continuous pulsed fluoroscopy. Once target was reached, antero-posterior, oblique, and lateral fluoroscopic projection used confirm needle placement in all planes. Images permanently stored in EMR. Interpretation: No contrast injected. I personally interpreted the imaging intraoperatively. Adequate needle placement confirmed in multiple  planes. Permanent images saved into the patient's record.  Antibiotic Prophylaxis:   Anti-infectives (From admission, onward)   None     Indication(s): None identified  Post-operative Assessment:  Post-procedure Vital Signs:  Pulse/HCG Rate: 82  Temp: 97.9 F (36.6 C) Resp: 16 BP: (!) 149/94 (NSR) SpO2: 98 %  EBL: None  Complications: No immediate post-treatment complications observed by team, or reported by patient.  Note: The patient tolerated the entire procedure well. A repeat set of vitals were taken after the procedure and the patient was kept under observation following institutional policy, for this type of procedure. Post-procedural neurological assessment was performed, showing return to baseline, prior to discharge. The patient was provided with post-procedure discharge instructions, including a section on how to identify potential problems. Should any problems arise concerning this procedure, the patient was given instructions to immediately contact us, at any time, without hesitation. In any case, we plan to contact the patient by telephone for a follow-up status report regarding this interventional procedure.  Comments:  No additional relevant information.  Plan of Care  Orders:  Orders Placed This Encounter  Procedures  . DG PAIN CLINIC C-ARM 1-60 MIN NO REPORT    Intraoperative interpretation by procedural physician at [facility].    Standing Status:   Standing    Number of Occurrences:   1    Order Specific Question:   Reason for exam:    Answer:   Assistance in needle guidance and placement for procedures requiring needle placement in or near specific anatomical locations not easily accessible without such assistance.   Medications ordered for procedure: Meds ordered this encounter  Medications  . lidocaine (XYLOCAINE) 2 % (with pres) injection 400 mg  . ropivacaine (PF) 2 mg/mL (0.2%) (NAROPIN) injection 9 mL  . dexamethasone (DECADRON)  injection 10 mg  . dexamethasone (DECADRON) injection 10 mg  .  ropivacaine (PF) 2 mg/mL (0.2%) (NAROPIN) injection 9 mL   Medications administered: We administered lidocaine, ropivacaine (PF) 2 mg/mL (0.2%), dexamethasone, dexamethasone, and ropivacaine (PF) 2 mg/mL (0.2%).  See the medical record for exact dosing, route, and time of administration.  Follow-up plan:   Return in about 4 weeks (around 01/16/2020) for Post Procedure Evaluation, virtual.      B/L IA Knee steroid 12/12/19: Helpful,  T10, 11, 12 MBB on 12/19/2019 followed by RFA (previosly done with Dr Maryjean Ka in 2018)    Recent Visits Date Type Provider Dept  12/12/19 Procedure visit Gillis Santa, MD [facility]  12/08/19 Office Visit Gillis Santa, MD [facility]  Showing recent visits within past 90 days and meeting all other requirements Today's Visits Date Type Provider Dept  12/19/19 Procedure visit Gillis Santa, MD [facility]  Showing today's visits and meeting all other requirements Future Appointments Date Type Provider Dept  01/12/20 Appointment Gillis Santa, MD [facility]  Showing future appointments within next 90 days and meeting all other requirements  Disposition: Discharge home  Discharge (Date  Time): 12/19/2019; 1137 hrs.   Primary Care Physician: Erie Giovanni, MD Location: [facility] Note by: Gillis Santa, MD Date: 12/19/2019; Time: 1:54 PM  Disclaimer:  Medicine is not an exact science. The only guarantee in medicine is that nothing is guaranteed. It is important to note that the decision to proceed with this intervention was based on the information collected from the patient. The Data and conclusions were drawn from the patient's questionnaire, the interview, and the physical examination. Because the information was provided in large part by the patient, it cannot be guaranteed that it has not been purposely or  unconsciously manipulated. Every effort has been made to obtain as much relevant data as possible for this evaluation. It is important to note that the conclusions that lead to this procedure are derived in large part from the available data. Always take into account that the treatment will also be dependent on availability of resources and existing treatment guidelines, considered by other Pain Management Practitioners as being common knowledge and practice, at the time of the intervention. For Medico-Legal purposes, it is also important to point out that variation in procedural techniques and pharmacological choices are the acceptable norm. The indications, contraindications, technique, and results of the above procedure should only be interpreted and judged by a Board-Certified Interventional Pain Specialist with extensive familiarity and expertise in the same exact procedure and technique.

## 2019-12-20 ENCOUNTER — Telehealth: Payer: Self-pay

## 2019-12-20 NOTE — Telephone Encounter (Signed)
Post procedure phone call.  LM 

## 2020-01-11 ENCOUNTER — Encounter: Payer: Self-pay | Admitting: Student in an Organized Health Care Education/Training Program

## 2020-01-12 ENCOUNTER — Encounter: Payer: Self-pay | Admitting: Student in an Organized Health Care Education/Training Program

## 2020-01-12 ENCOUNTER — Ambulatory Visit
Payer: No Typology Code available for payment source | Attending: Student in an Organized Health Care Education/Training Program | Admitting: Student in an Organized Health Care Education/Training Program

## 2020-01-12 ENCOUNTER — Telehealth
Payer: No Typology Code available for payment source | Admitting: Student in an Organized Health Care Education/Training Program

## 2020-01-12 ENCOUNTER — Other Ambulatory Visit: Payer: Self-pay

## 2020-01-12 DIAGNOSIS — M47894 Other spondylosis, thoracic region: Secondary | ICD-10-CM | POA: Diagnosis not present

## 2020-01-12 DIAGNOSIS — M47816 Spondylosis without myelopathy or radiculopathy, lumbar region: Secondary | ICD-10-CM | POA: Diagnosis not present

## 2020-01-12 DIAGNOSIS — M47814 Spondylosis without myelopathy or radiculopathy, thoracic region: Secondary | ICD-10-CM | POA: Diagnosis not present

## 2020-01-12 NOTE — Progress Notes (Signed)
Patient: Evan Cherry  Service Category: E/M  Provider: Gillis Santa, MD  DOB: 1965-07-28  DOS: 01/12/2020  Location: Office  MRN: [id number]  Setting: Ambulatory outpatient  Referring Provider: Cledis Giovanni, MD  Type: Established Patient  Specialty: Interventional Pain Management  PCP: Cornelio Giovanni, MD  Location: Home  Delivery: TeleHealth     Virtual Encounter - Pain Management PROVIDER NOTE: Information contained herein reflects review and annotations entered in association with encounter. Interpretation of such information and data should be left to medically-trained personnel. Information provided to patient can be located elsewhere in the medical record under "Patient Instructions". Document created using STT-dictation technology, any transcriptional errors that may result from process are unintentional.    Contact & Pharmacy Preferred: [phone] Home: [phone] (home) Mobile: [phone] (mobile) E-mail: No e-mail address on record  [facility] [place], [place] - [place] [address] [place] [address] Phone: [phone] Fax: [phone]   Pre-screening  Mr. Hebard offered "in-person" vs "virtual" encounter. He indicated preferring virtual for this encounter.   Reason COVID-19*   Social distancing based on CDC and AMA recommendations.   I contacted Evan Cherry on 01/12/2020 via video conference.      I clearly identified myself as Gillis Santa, MD. I verified that I was speaking with the correct person using two identifiers (Name: DELMUS WARWICK, and date of birth: [birth year]).  Consent I sought verbal advanced consent from Evan Cherry for virtual visit interactions. I informed Mr. Beane of possible security and privacy concerns, risks, and limitations associated with providing "not-in-person" medical evaluation and management services. I also informed Mr. Hertzberg of the availability of  "in-person" appointments. Finally, I informed him that there would be a charge for the virtual visit and that he could be  personally, fully or partially, financially responsible for it. Mr. Leazer expressed understanding and agreed to proceed.   Historic Elements   Mr. GRAYTON LOBO is a 54 y.o. year old, male patient evaluated today after our last contact on 12/19/2019. Mr. Nila  has a past medical history of Asthma, Back pain, COPD (chronic obstructive pulmonary disease) ([place]), Decreased lung capacity, Hypertension, and Sleep apnea. He also  has no past surgical history on file. Mr. Chermak has a current medication list which includes the following prescription(s): acetaminophen, albuterol, benazepril-hydrochlorthiazide, budesonide-formoterol, cyanocobalamin, cyanocobalamin, hydrocodone-acetaminophen, lidocaine, meloxicam, multiple vitamin, naproxen sodium, pravastatin, testosterone cypionate, tiotropium, tramadol, and tizanidine. He  reports that he has quit smoking. His smokeless tobacco use includes snuff. He reports that he does not drink alcohol and does not use drugs. Mr. Stoffers has No Known Allergies.   HPI  Today, he is being contacted for a post-procedure assessment.  Of note, patient is status post thoracic facet medial branch RFA at T10, T11, T12 with Dr. Maryjean Ka on March 15, 2017.  Given that Dr. Maryjean Ka has moved, patient has transferred care here.  He is having return of his thoracic pain and so plan was for diagnostic/therapeutic facet medial branch nerve block which was done on 12/19/19 (results below. Virtual visit today to discuss post procedure results and schedule RFA.   Post-Procedure Evaluation  Procedure (12/19/2019):   Type: Therapeutic Medial Branch Facet Block  #1  Region:Thoracic Level: T10, T11, T12 medial Branch Level(s) Laterality: Bilateral  Sedation: Please see nurses note.  Effectiveness during initial hour after procedure(Ultra-Short Term Relief): 50 %.  Local  anesthetic used:  Long-acting (4-6 hours) Effectiveness: Defined as any analgesic benefit obtained secondary to the administration of local anesthetics. This carries significant diagnostic value as to the etiological location, or anatomical origin, of the pain. Duration of benefit is expected to coincide with the duration of the local anesthetic used.  Effectiveness during initial 4-6 hours after procedure(Short-Term Relief): 0 %   Long-term benefit: Defined as any relief past the pharmacologic duration of the local anesthetics.  Effectiveness past the initial 6 hours after procedure(Long-Term Relief): 75 % (current)   Current benefits: Defined as benefit that persist at this time.   Analgesia:  >50% relief, pain is gradually returning Function: Mr. Metallo reports improvement in function ROM: Mr. Barris reports improvement in ROM   Laboratory Chemistry Profile   Renal Lab Results  Component Value Date   BUN 15 12/09/2014   CREATININE 0.82 12/09/2014   GFRAA >60 12/09/2014   GFRNONAA >60 12/09/2014     Hepatic Lab Results  Component Value Date   AST 25 12/08/2014   ALT 34 12/08/2014   ALBUMIN 4.4 12/08/2014   ALKPHOS 72 12/08/2014   LIPASE 171 12/26/2011     Electrolytes Lab Results  Component Value Date   NA 142 12/09/2014   K 4.2 12/09/2014   CL 107 12/09/2014   CALCIUM 9.2 12/09/2014     Bone No results found for: VD25OH, VD125OH2TOT, ZO1096EA5, WU9811BJ4, 25OHVITD1, 25OHVITD2, 25OHVITD3, TESTOFREE, TESTOSTERONE   Inflammation (CRP: Acute Phase) (ESR: Chronic Phase) No results found for: CRP, ESRSEDRATE, LATICACIDVEN     Note: Above Lab results reviewed.  Imaging  DG PAIN CLINIC C-ARM 1-60 MIN NO REPORT Fluoro was used, but no Radiologist interpretation will be provided.  Please refer to "NOTES" tab for provider progress note.  Assessment  The primary encounter diagnosis was Thoracic spondylosis without myelopathy. Diagnoses of Thoracic facet joint syndrome and  Lumbar spondylosis were also pertinent to this visit.  Plan of Care   Mr. CHRISTOPHERJAME CARNELL has a current medication list which includes the following long-term medication(s): albuterol, benazepril-hydrochlorthiazide, budesonide-formoterol, pravastatin, testosterone cypionate, and tiotropium.  T10, 11, 12 MBB on 12/19/2019 followed by RFA (previosly done with Dr Maryjean Ka in 2018).  Postprocedural follow-up today status post bilateral T10, T11, T12 thoracic facet medial branch nerve block 12/19/2019 which provided 75% pain relief for 3-1/2 weeks with gradual return of pain thereafter.  Plan for thoracic radiofrequency ablation starting with right side first.   Orders:  Orders Placed This Encounter  Procedures   Radiofrequency,Thoracic    Standing Status:   Future    Standing Expiration Date:   01/11/2021    Scheduling Instructions:     Side(s)/levels: Right T10, T11, T12          Sedation: Up to patient     Scheduling Timeframe: As soon as pre-approved    Order Specific Question:   Where will this procedure be performed?    Answer:   [facility]   Radiofrequency,Thoracic    Standing Status:   Future    Standing Expiration Date:   07/11/2020    Scheduling Instructions:     Side(s)/levels: Left T10, T11, T12          Sedation: Up to patient     Scheduling Timeframe: 2 weeks after right    Order Specific Question:   Where will this procedure be performed?    Answer:   [facility]   Follow-up plan:   Return in about 2 weeks (around 01/26/2020) for Right  T10, T11, T12 RFA, with sedation.     B/L IA Knee steroid 12/12/19: Helpful,  T10, 11, 12 MBB on 12/19/2019 followed by RFA (previosly done with Dr Maryjean Ka in 2018), status post bilateral T10, T11, T12 thoracic facet medial branch nerve block 8/23 2021 which provided 75% pain relief for 3-1/2 weeks with gradual return of pain thereafter.  Plan for thoracic radiofrequency ablation starting with right side first.     Recent  Visits Date Type Provider Dept  12/19/19 Procedure visit Gillis Santa, MD [facility]  12/12/19 Procedure visit Gillis Santa, MD [facility]  12/08/19 Office Visit Gillis Santa, MD [facility]  Showing recent visits within past 90 days and meeting all other requirements Today's Visits Date Type Provider Dept  01/12/20 Telemedicine Gillis Santa, MD [facility]  Showing today's visits and meeting all other requirements Future Appointments No visits were found meeting these conditions. Showing future appointments within next 90 days and meeting all other requirements  I discussed the assessment and treatment plan with the patient. The patient was provided an opportunity to ask questions and all were answered. The patient agreed with the plan and demonstrated an understanding of the instructions.  Patient advised to call back or seek an in-person evaluation if the symptoms or condition worsens.  Duration of encounter:20 minutes.  Note by: Gillis Santa, MD Date: 01/12/2020; Time: 10:11 AM

## 2020-01-16 ENCOUNTER — Telehealth
Payer: No Typology Code available for payment source | Admitting: Student in an Organized Health Care Education/Training Program

## 2020-01-30 ENCOUNTER — Other Ambulatory Visit: Payer: Self-pay

## 2020-01-30 ENCOUNTER — Ambulatory Visit (HOSPITAL_BASED_OUTPATIENT_CLINIC_OR_DEPARTMENT_OTHER)
Payer: No Typology Code available for payment source | Admitting: Student in an Organized Health Care Education/Training Program

## 2020-01-30 ENCOUNTER — Encounter: Payer: Self-pay | Admitting: Student in an Organized Health Care Education/Training Program

## 2020-01-30 ENCOUNTER — Ambulatory Visit
Admission: RE | Admit: 2020-01-30 | Discharge: 2020-01-30 | Disposition: A | Discharge status: Home / Self Care | Payer: No Typology Code available for payment source | Source: Ambulatory Visit | Attending: Student in an Organized Health Care Education/Training Program | Admitting: Student in an Organized Health Care Education/Training Program

## 2020-01-30 VITALS — BP 117/61 | HR 72 | Temp 97.2°F | Resp 16 | Ht 68.0 in | Wt 226.0 lb

## 2020-01-30 DIAGNOSIS — G894 Chronic pain syndrome: Secondary | ICD-10-CM

## 2020-01-30 DIAGNOSIS — M47894 Other spondylosis, thoracic region: Secondary | ICD-10-CM | POA: Diagnosis not present

## 2020-01-30 DIAGNOSIS — M47814 Spondylosis without myelopathy or radiculopathy, thoracic region: Secondary | ICD-10-CM

## 2020-01-30 MED ORDER — ROPIVACAINE HCL 2 MG/ML IJ SOLN
INTRAMUSCULAR | Status: AC
  Filled 2020-01-30: qty 10

## 2020-01-30 MED ORDER — MIDAZOLAM HCL 5 MG/5ML IJ SOLN
1.0000 mg | INTRAMUSCULAR | Status: DC | PRN
  Administered 2020-01-30: 1 mg via INTRAVENOUS

## 2020-01-30 MED ORDER — DEXAMETHASONE SODIUM PHOSPHATE 10 MG/ML IJ SOLN
10.0000 mg | Freq: Once | INTRAMUSCULAR | Status: AC
  Administered 2020-01-30: 10 mg

## 2020-01-30 MED ORDER — FENTANYL CITRATE (PF) 100 MCG/2ML IJ SOLN
25.0000 ug | INTRAMUSCULAR | Status: DC | PRN
  Administered 2020-01-30: 25 ug via INTRAVENOUS

## 2020-01-30 MED ORDER — MIDAZOLAM HCL 5 MG/5ML IJ SOLN
INTRAMUSCULAR | Status: AC
  Filled 2020-01-30: qty 5

## 2020-01-30 MED ORDER — LIDOCAINE HCL 2 % IJ SOLN
20.0000 mL | Freq: Once | INTRAMUSCULAR | Status: AC
  Administered 2020-01-30: 200 mg

## 2020-01-30 MED ORDER — LIDOCAINE HCL 2 % IJ SOLN
INTRAMUSCULAR | Status: AC
  Filled 2020-01-30: qty 10

## 2020-01-30 MED ORDER — FENTANYL CITRATE (PF) 100 MCG/2ML IJ SOLN
INTRAMUSCULAR | Status: AC
  Filled 2020-01-30: qty 2

## 2020-01-30 MED ORDER — DEXAMETHASONE SODIUM PHOSPHATE 10 MG/ML IJ SOLN
INTRAMUSCULAR | Status: AC
  Filled 2020-01-30: qty 1

## 2020-01-30 MED ORDER — ROPIVACAINE HCL 2 MG/ML IJ SOLN
9.0000 mL | Freq: Once | INTRAMUSCULAR | Status: AC
  Administered 2020-01-30: 9 mL via PERINEURAL

## 2020-01-30 NOTE — Progress Notes (Signed)
Safety precautions to be maintained throughout the outpatient stay will include: orient to surroundings, keep bed in low position, maintain call bell within reach at all times, provide assistance with transfer out of bed and ambulation.  

## 2020-01-30 NOTE — Patient Instructions (Signed)

## 2020-01-30 NOTE — Progress Notes (Signed)
PROVIDER NOTE: Information contained herein reflects review and annotations entered in association with encounter. Interpretation of such information and data should be left to medically-trained personnel. Information provided to patient can be located elsewhere in the medical record under "Patient Instructions". Document created using STT-dictation technology, any transcriptional errors that may result from process are unintentional.    Patient: Evan Cherry  Service Category: Procedure  Provider: Gillis Santa, MD  DOB: 1965/05/04  DOS: 01/30/2020  Location: [facility]  MRN: [id number]  Setting: Ambulatory - outpatient  Referring Provider: Hatem Giovanni, MD  Type: Established Patient  Specialty: Interventional Pain Management  PCP: Artice Giovanni, MD   Primary Reason for Visit: Interventional Pain Management Treatment. CC: Back Pain (lumbar right )  Procedure:          Anesthesia, Analgesia, Anxiolysis:  Type: Therapeutic Thoracic Medial Nerve Radiofrequency Ablation  #1  Region: Posterior   Level: T10-11, T11-12, T12-L1 and   Medial Branch Level(s) Laterality: Right-Sided Paraspinal  Type: Moderate (Conscious) Sedation combined with Local Anesthesia Indication(s): Analgesia and Anxiety Route: Intravenous (IV) IV Access: Secured Sedation: Meaningful verbal contact was maintained at all times during the procedure  Local Anesthetic: Lidocaine 1-2%  Position: Prone   Indications: 1. Thoracic spondylosis without myelopathy   2. Thoracic facet joint syndrome   3. Chronic pain syndrome    Evan Cherry has been dealing with the above chronic pain for longer than three months and has either failed to respond, was unable to tolerate, or simply did not get enough benefit from other more conservative therapies including, but not limited to: 1. Over-the-counter medications 2. Anti-inflammatory medications 3. Muscle relaxants 4. Membrane stabilizers 5. Opioids 6. Physical  therapy and/or chiropractic manipulation 7. Modalities (Heat, ice, etc.) 8. Invasive techniques such as nerve blocks. Evan Cherry has attained more than 50% relief of the pain from a series of diagnostic injections conducted in separate occasions.  Pain Score: Pre-procedure: 4 /10 Post-procedure: 1 /10  Pre-op Assessment:  Mr. Trudo is a 54 y.o. (year old), male patient, seen today for interventional treatment. He  has no past surgical history on file. Mr. Stockard has a current medication list which includes the following prescription(s): acetaminophen, albuterol, benazepril-hydrochlorthiazide, budesonide-formoterol, cholecalciferol, cyanocobalamin, hydrocodone-acetaminophen, lidocaine, meloxicam, multiple vitamin, naproxen sodium, pravastatin, testosterone cypionate, tiotropium, tizanidine, tramadol, and cyanocobalamin, and the following Facility-Administered Medications: fentanyl and midazolam. His primarily concern today is the Back Pain (lumbar right )  Initial Vital Signs:  Pulse/HCG Rate: 69ECG Heart Rate: 73 Temp: (!) 97 F (36.1 C) Resp: 16 BP: 126/78 SpO2: 98 %  BMI: Estimated body mass index is 34.36 kg/m as calculated from the following:   Height as of this encounter: 5\' 8"  (1.727 m).   Weight as of this encounter: 226 lb (102.5 kg).  Risk Assessment: Allergies: Reviewed. He has No Known Allergies.  Allergy Precautions: None required Coagulopathies: Reviewed. None identified.  Blood-thinner therapy: None at this time Active Infection(s): Reviewed. None identified. Evan Cherry is afebrile  Site Confirmation: Evan Cherry was asked to confirm the procedure and laterality before marking the site Procedure checklist: Completed Consent: Before the procedure and under the influence of no sedative(s), amnesic(s), or anxiolytics, the patient was informed of the treatment options, risks and possible complications. To fulfill our ethical and legal obligations, as recommended by the American  Medical Association's Code of Ethics, I have informed the patient of my clinical impression; the nature and purpose of the treatment or procedure; the risks, benefits,  and possible complications of the intervention; the alternatives, including doing nothing; the risk(s) and benefit(s) of the alternative treatment(s) or procedure(s); and the risk(s) and benefit(s) of doing nothing. The patient was provided information about the general risks and possible complications associated with the procedure. These may include, but are not limited to: failure to achieve desired goals, infection, bleeding, organ or nerve damage, allergic reactions, paralysis, and death. In addition, the patient was informed of those risks and complications associated to Spine-related procedures, such as failure to decrease pain; infection (i.e.: Meningitis, epidural or intraspinal abscess); bleeding (i.e.: epidural hematoma, subarachnoid hemorrhage, or any other type of intraspinal or peri-dural bleeding); organ or nerve damage (i.e.: Any type of peripheral nerve, nerve root, or spinal cord injury) with subsequent damage to sensory, motor, and/or autonomic systems, resulting in permanent pain, numbness, and/or weakness of one or several areas of the body; allergic reactions; (i.e.: anaphylactic reaction); and/or death. Furthermore, the patient was informed of those risks and complications associated with the medications. These include, but are not limited to: allergic reactions (i.e.: anaphylactic or anaphylactoid reaction(s)); adrenal axis suppression; blood sugar elevation that in diabetics may result in ketoacidosis or comma; water retention that in patients with history of congestive heart failure may result in shortness of breath, pulmonary edema, and decompensation with resultant heart failure; weight gain; swelling or edema; medication-induced neural toxicity; particulate matter embolism and blood vessel occlusion with resultant organ,  and/or nervous system infarction; and/or aseptic necrosis of one or more joints. Finally, the patient was informed that Medicine is not an exact science; therefore, there is also the possibility of unforeseen or unpredictable risks and/or possible complications that may result in a catastrophic outcome. The patient indicated having understood very clearly. We have given the patient no guarantees and we have made no promises. Enough time was given to the patient to ask questions, all of which were answered to the patient's satisfaction. Mr. Waas has indicated that he wanted to continue with the procedure. Attestation: I, the ordering provider, attest that I have discussed with the patient the benefits, risks, side-effects, alternatives, likelihood of achieving goals, and potential problems during recovery for the procedure that I have provided informed consent. Date  Time: 01/30/2020  9:54 AM  Pre-Procedure Preparation:  Monitoring: As per clinic protocol. Respiration, ETCO2, SpO2, BP, heart rate and rhythm monitor placed and checked for adequate function Safety Precautions: Patient was assessed for positional comfort and pressure points before starting the procedure. Time-out: I initiated and conducted the "Time-out" before starting the procedure, as per protocol. The patient was asked to participate by confirming the accuracy of the "Time Out" information. Verification of the correct person, site, and procedure were performed and confirmed by me, the nursing staff, and the patient. "Time-out" conducted as per Joint Commission's Universal Protocol (UP.01.01.01). Time: 1045  Description of Procedure:          Target Area: For Lumbar Facet block(s), the target is the groove formed by the junction of the transverse process and superior articular process. For the L5 dorsal ramus, the target is the notch between superior articular process and sacral ala. For the S1 dorsal ramus, the target is the superior and  lateral edge of the posterior S1 Sacral foramen. Approach: Paraspinal approach. Area Prepped: Entire Upper Back Area DuraPrep (Iodine Povacrylex [0.7% available iodine] and Isopropyl Alcohol, 74% w/w) Safety Precautions: Aspiration looking for blood return was conducted prior to all injections. At no point did we inject any substances, as a  needle was being advanced. No attempts were made at seeking any paresthesias. Safe injection practices and needle disposal techniques used. Medications properly checked for expiration dates. SDV (single dose vial) medications used. Description of the Procedure: Protocol guidelines were followed. The patient was placed in position over the procedure table. The target area was identified and the area prepped in the usual manner. The skin and muscle were infiltrated with local anesthetic. Appropriate amount of time allowed to pass for local anesthetics to take effect. Radiofrequency needles were introduced to the target area using fluoroscopic guidance. Using the NeuroTherm NT1100 Radiofrequency Generator, sensory stimulation using 50 Hz was used to locate & identify the nerve, making sure that the needle was positioned such that there was no sensory stimulation below 0.3 V or above 0.7 V. Stimulation using 2 Hz was used to evaluate the motor component. Care was taken not to lesion any nerves that demonstrated motor stimulation of the lower extremities at an output of less than 2.5 times that of the sensory threshold, or a maximum of 2.0 V. Once satisfactory placement of the needles was achieved, the numbing solution was slowly injected after negative aspiration. After waiting for at least 2 minutes, the ablation was performed at 80 degrees C for 60 seconds, using regular Radiofrequency settings. Once the procedure was completed, the needles were then removed and the area cleansed, making sure to leave some of the prepping solution back to take advantage of its long term  bactericidal properties. Intra-operative Compliance: Compliant        Vitals:   01/30/20 1102 01/30/20 1110 01/30/20 1120 01/30/20 1131  BP: 129/64 133/83 119/67 117/61  Pulse: 72     Resp: 16 14 14 16   Temp:  (!) 97.2 F (36.2 C)    TempSrc:      SpO2: 96% 96% 98% 97%  Weight:      Height:        Start Time: 1045 hrs. End Time: 1101 hrs. Materials & Medications:  Needle(s) Type: Teflon-coated, curved tip, Radiofrequency needle(s) Gauge: 22G Length: 10cm Medication(s): Please see orders for medications and dosing details. 6 cc solution made of 5 cc of 0.2% ropivacaine, 1 cc of Decadron 10 mg/cc.  2 cc injected at each level on the right after sensorimotor testing, prior to lesioning. Imaging Guidance (Spinal):          Type of Imaging Technique: Fluoroscopy Guidance (Spinal) Indication(s): Assistance in needle guidance and placement for procedures requiring needle placement in or near specific anatomical locations not easily accessible without such assistance. Exposure Time: Please see nurses notes. Contrast: None used. Fluoroscopic Guidance: I was personally present during the use of fluoroscopy. "Tunnel Vision Technique" used to obtain the best possible view of the target area. Parallax error corrected before commencing the procedure. "Direction-depth-direction" technique used to introduce the needle under continuous pulsed fluoroscopy. Once target was reached, antero-posterior, oblique, and lateral fluoroscopic projection used confirm needle placement in all planes. Images permanently stored in EMR. Interpretation: No contrast injected. I personally interpreted the imaging intraoperatively. Adequate needle placement confirmed in multiple planes. Permanent images saved into the patient's record.  Antibiotic Prophylaxis:   Anti-infectives (From admission, onward)   None     Indication(s): None identified  Post-operative Assessment:  Post-procedure Vital Signs:  Pulse/HCG  Rate: 7277 Temp: (!) 97.2 F (36.2 C) Resp: 16 BP: 117/61 SpO2: 97 %  EBL: None  Complications: No immediate post-treatment complications observed by team, or reported by patient.  Note: The patient  tolerated the entire procedure well. A repeat set of vitals were taken after the procedure and the patient was kept under observation following institutional policy, for this type of procedure. Post-procedural neurological assessment was performed, showing return to baseline, prior to discharge. The patient was provided with post-procedure discharge instructions, including a section on how to identify potential problems. Should any problems arise concerning this procedure, the patient was given instructions to immediately contact us, at any time, without hesitation. In any case, we plan to contact the patient by telephone for a follow-up status report regarding this interventional procedure.  Comments:  No additional relevant information.  Plan of Care  Orders:  Orders Placed This Encounter  Procedures  . [facility] C-ARM 1-60 MIN NO REPORT    Intraoperative interpretation by procedural physician at [facility].    Standing Status:   Standing    Number of Occurrences:   1    Order Specific Question:   Reason for exam:    Answer:   Assistance in needle guidance and placement for procedures requiring needle placement in or near specific anatomical locations not easily accessible without such assistance.   Medications ordered for procedure: Meds ordered this encounter  Medications  . lidocaine (XYLOCAINE) 2 % (with pres) injection 400 mg  . midazolam (VERSED) 5 MG/5ML injection 1-2 mg    Make sure Flumazenil is available in the pyxis when using this medication. If oversedation occurs, administer 0.2 mg IV over 15 sec. If after 45 sec no response, administer 0.2 mg again over 1 min; may repeat at 1 min intervals; not to exceed 4 doses (1 mg)  . fentaNYL (SUBLIMAZE) injection  25-50 mcg    Make sure Narcan is available in the pyxis when using this medication. In the event of respiratory depression (RR< 8/min): Titrate NARCAN (naloxone) in increments of 0.1 to 0.2 mg IV at 2-3 minute intervals, until desired degree of reversal.  . dexamethasone (DECADRON) injection 10 mg  . ropivacaine (PF) 2 mg/mL (0.2%) (NAROPIN) injection 9 mL   Medications administered: We administered lidocaine, midazolam, fentaNYL, dexamethasone, and ropivacaine (PF) 2 mg/mL (0.2%).  See the medical record for exact dosing, route, and time of administration.  Follow-up plan:   Return for Keep sch. appt, Contra-lateral RFA.      B/L IA Knee steroid 12/12/19: Helpful,  T10, 11, 12 MBB on 12/19/2019 followed by RFA (previosly done with Dr Maryjean Ka in 2018), status post bilateral T10, T11, T12 thoracic facet medial branch nerve block 8/23 2021 which provided 75% pain relief for 3-1/2 weeks with gradual return of pain thereafter.  Right T10, T11, T12 RFA on 01/30/2020   Recent Visits Date Type Provider Dept  01/12/20 Telemedicine Gillis Santa, MD [facility]  12/19/19 Procedure visit Gillis Santa, MD [facility]  12/12/19 Procedure visit Gillis Santa, MD [facility]  12/08/19 Office Visit Gillis Santa, MD [facility]  Showing recent visits within past 90 days and meeting all other requirements Today's Visits Date Type Provider Dept  01/30/20 Procedure visit Gillis Santa, MD [facility]  Showing today's visits and meeting all other requirements Future Appointments Date Type Provider Dept  02/20/20 Appointment Gillis Santa, MD [facility]  Showing future appointments within next 90 days and meeting all other requirements  Disposition: Discharge home  Discharge (Date  Time): 01/30/2020; 1132 hrs.   Primary Care Physician: Corneilus Giovanni, MD Location: [facility] Note by: Gillis Santa,  MD Date:  01/30/2020; Time: 11:42 AM  Disclaimer:  Medicine is not an Chief Strategy Officer. The only guarantee in medicine is that nothing is guaranteed. It is important to note that the decision to proceed with this intervention was based on the information collected from the patient. The Data and conclusions were drawn from the patient's questionnaire, the interview, and the physical examination. Because the information was provided in large part by the patient, it cannot be guaranteed that it has not been purposely or unconsciously manipulated. Every effort has been made to obtain as much relevant data as possible for this evaluation. It is important to note that the conclusions that lead to this procedure are derived in large part from the available data. Always take into account that the treatment will also be dependent on availability of resources and existing treatment guidelines, considered by other Pain Management Practitioners as being common knowledge and practice, at the time of the intervention. For Medico-Legal purposes, it is also important to point out that variation in procedural techniques and pharmacological choices are the acceptable norm. The indications, contraindications, technique, and results of the above procedure should only be interpreted and judged by a Board-Certified Interventional Pain Specialist with extensive familiarity and expertise in the same exact procedure and technique.

## 2020-01-31 ENCOUNTER — Telehealth: Payer: Self-pay

## 2020-01-31 NOTE — Telephone Encounter (Signed)
Post procedure phone call.  LM 

## 2020-02-13 ENCOUNTER — Ambulatory Visit
Payer: No Typology Code available for payment source | Admitting: Student in an Organized Health Care Education/Training Program

## 2020-02-20 ENCOUNTER — Other Ambulatory Visit: Payer: Self-pay

## 2020-02-20 ENCOUNTER — Ambulatory Visit
Admission: RE | Admit: 2020-02-20 | Discharge: 2020-02-20 | Disposition: A | Discharge status: Home / Self Care | Payer: No Typology Code available for payment source | Source: Ambulatory Visit | Attending: Student in an Organized Health Care Education/Training Program | Admitting: Student in an Organized Health Care Education/Training Program

## 2020-02-20 ENCOUNTER — Encounter: Payer: Self-pay | Admitting: Student in an Organized Health Care Education/Training Program

## 2020-02-20 ENCOUNTER — Ambulatory Visit (HOSPITAL_BASED_OUTPATIENT_CLINIC_OR_DEPARTMENT_OTHER)
Payer: No Typology Code available for payment source | Admitting: Student in an Organized Health Care Education/Training Program

## 2020-02-20 VITALS — BP 129/81 | HR 68 | Temp 98.0°F | Resp 16 | Ht 68.0 in | Wt 230.0 lb

## 2020-02-20 DIAGNOSIS — M47814 Spondylosis without myelopathy or radiculopathy, thoracic region: Secondary | ICD-10-CM | POA: Insufficient documentation

## 2020-02-20 DIAGNOSIS — G894 Chronic pain syndrome: Secondary | ICD-10-CM | POA: Insufficient documentation

## 2020-02-20 DIAGNOSIS — M47894 Other spondylosis, thoracic region: Secondary | ICD-10-CM

## 2020-02-20 MED ORDER — ROPIVACAINE HCL 2 MG/ML IJ SOLN
9.0000 mL | Freq: Once | INTRAMUSCULAR | Status: AC
  Administered 2020-02-20: 9 mL via PERINEURAL

## 2020-02-20 MED ORDER — DEXAMETHASONE SODIUM PHOSPHATE 10 MG/ML IJ SOLN
10.0000 mg | Freq: Once | INTRAMUSCULAR | Status: AC
  Administered 2020-02-20: 10 mg

## 2020-02-20 MED ORDER — LIDOCAINE HCL 2 % IJ SOLN
20.0000 mL | Freq: Once | INTRAMUSCULAR | Status: AC
  Administered 2020-02-20: 400 mg

## 2020-02-20 MED ORDER — FENTANYL CITRATE (PF) 100 MCG/2ML IJ SOLN
25.0000 ug | INTRAMUSCULAR | Status: AC | PRN
  Administered 2020-02-20 (×2): 25 ug via INTRAVENOUS
  Administered 2020-02-20: 50 ug via INTRAVENOUS

## 2020-02-20 MED ORDER — ROPIVACAINE HCL 2 MG/ML IJ SOLN
INTRAMUSCULAR | Status: AC
  Filled 2020-02-20: qty 10

## 2020-02-20 MED ORDER — LIDOCAINE HCL 2 % IJ SOLN
INTRAMUSCULAR | Status: AC
  Filled 2020-02-20: qty 20

## 2020-02-20 MED ORDER — DEXAMETHASONE SODIUM PHOSPHATE 10 MG/ML IJ SOLN
INTRAMUSCULAR | Status: AC
  Filled 2020-02-20: qty 1

## 2020-02-20 MED ORDER — FENTANYL CITRATE (PF) 100 MCG/2ML IJ SOLN
INTRAMUSCULAR | Status: AC
  Filled 2020-02-20: qty 2

## 2020-02-20 NOTE — Progress Notes (Signed)
PROVIDER NOTE: Information contained herein reflects review and annotations entered in association with encounter. Interpretation of such information and data should be left to medically-trained personnel. Information provided to patient can be located elsewhere in the medical record under "Patient Instructions". Document created using STT-dictation technology, any transcriptional errors that may result from process are unintentional.    Patient: Evan Cherry  Service Category: Procedure  Provider: Gillis Santa, MD  DOB: 08/04/1965  DOS: 02/20/2020  Location: [facility]  MRN: [id number]  Setting: Ambulatory - outpatient  Referring Provider: Seward Giovanni, MD  Type: Established Patient  Specialty: Interventional Pain Management  PCP: Momin Giovanni, MD   Primary Reason for Visit: Interventional Pain Management Treatment. CC: Back Pain (left, lower)  Procedure:          Anesthesia, Analgesia, Anxiolysis:  Type: Therapeutic Thoracic Medial Nerve Radiofrequency Ablation  #1  Region: Posterior   Level: T10-11, T11-12, T12-L1 and   Medial Branch Level(s) Laterality: Left-Sided Paraspinal  Type: Moderate (Conscious) Sedation combined with Local Anesthesia Indication(s): Analgesia and Anxiety Route: Intravenous (IV) IV Access: Secured Sedation: Meaningful verbal contact was maintained at all times during the procedure  Local Anesthetic: Lidocaine 1-2%  Position: Prone   Indications: 1. Thoracic spondylosis without myelopathy   2. Thoracic facet joint syndrome   3. Chronic pain syndrome    Evan Cherry has been dealing with the above chronic pain for longer than three months and has either failed to respond, was unable to tolerate, or simply did not get enough benefit from other more conservative therapies including, but not limited to: 1. Over-the-counter medications 2. Anti-inflammatory medications 3. Muscle relaxants 4. Membrane stabilizers 5. Opioids 6. Physical  therapy and/or chiropractic manipulation 7. Modalities (Heat, ice, etc.) 8. Invasive techniques such as nerve blocks. Evan Cherry has attained more than 50% relief of the pain from a series of diagnostic injections conducted in separate occasions.  Pain Score: Pre-procedure: 3 /10 Post-procedure: 0-No pain/10  Pre-op Assessment:  Evan Cherry is a 54 y.o. (year old), male patient, seen today for interventional treatment. He  has no past surgical history on file. Evan Cherry has a current medication list which includes the following prescription(s): acetaminophen, albuterol, benazepril-hydrochlorthiazide, budesonide-formoterol, cholecalciferol, cyanocobalamin, hydrocodone-acetaminophen, lidocaine, meloxicam, multiple vitamin, naproxen sodium, pravastatin, testosterone cypionate, tiotropium, tizanidine, tramadol, and cyanocobalamin. His primarily concern today is the Back Pain (left, lower)  Initial Vital Signs:  Pulse/HCG Rate: 68ECG Heart Rate: 73 Temp: 98.2 F (36.8 C) Resp: 16 BP: 136/73 SpO2: 98 %  BMI: Estimated body mass index is 34.97 kg/m as calculated from the following:   Height as of this encounter: 5\' 8"  (1.727 m).   Weight as of this encounter: 230 lb (104.3 kg).  Risk Assessment: Allergies: Reviewed. He has No Known Allergies.  Allergy Precautions: None required Coagulopathies: Reviewed. None identified.  Blood-thinner therapy: None at this time Active Infection(s): Reviewed. None identified. Evan Cherry is afebrile  Site Confirmation: Evan Cherry was asked to confirm the procedure and laterality before marking the site Procedure checklist: Completed Consent: Before the procedure and under the influence of no sedative(s), amnesic(s), or anxiolytics, the patient was informed of the treatment options, risks and possible complications. To fulfill our ethical and legal obligations, as recommended by the American Medical Association's Code of Ethics, I have informed the patient of my  clinical impression; the nature and purpose of the treatment or procedure; the risks, benefits, and possible complications of the intervention; the alternatives, including doing nothing;  the risk(s) and benefit(s) of the alternative treatment(s) or procedure(s); and the risk(s) and benefit(s) of doing nothing. The patient was provided information about the general risks and possible complications associated with the procedure. These may include, but are not limited to: failure to achieve desired goals, infection, bleeding, organ or nerve damage, allergic reactions, paralysis, and death. In addition, the patient was informed of those risks and complications associated to Spine-related procedures, such as failure to decrease pain; infection (i.e.: Meningitis, epidural or intraspinal abscess); bleeding (i.e.: epidural hematoma, subarachnoid hemorrhage, or any other type of intraspinal or peri-dural bleeding); organ or nerve damage (i.e.: Any type of peripheral nerve, nerve root, or spinal cord injury) with subsequent damage to sensory, motor, and/or autonomic systems, resulting in permanent pain, numbness, and/or weakness of one or several areas of the body; allergic reactions; (i.e.: anaphylactic reaction); and/or death. Furthermore, the patient was informed of those risks and complications associated with the medications. These include, but are not limited to: allergic reactions (i.e.: anaphylactic or anaphylactoid reaction(s)); adrenal axis suppression; blood sugar elevation that in diabetics may result in ketoacidosis or comma; water retention that in patients with history of congestive heart failure may result in shortness of breath, pulmonary edema, and decompensation with resultant heart failure; weight gain; swelling or edema; medication-induced neural toxicity; particulate matter embolism and blood vessel occlusion with resultant organ, and/or nervous system infarction; and/or aseptic necrosis of one or  more joints. Finally, the patient was informed that Medicine is not an exact science; therefore, there is also the possibility of unforeseen or unpredictable risks and/or possible complications that may result in a catastrophic outcome. The patient indicated having understood very clearly. We have given the patient no guarantees and we have made no promises. Enough time was given to the patient to ask questions, all of which were answered to the patient's satisfaction. Mr. Mikhail has indicated that he wanted to continue with the procedure. Attestation: I, the ordering provider, attest that I have discussed with the patient the benefits, risks, side-effects, alternatives, likelihood of achieving goals, and potential problems during recovery for the procedure that I have provided informed consent. Date   Time: 02/20/2020  9:41 AM  Pre-Procedure Preparation:  Monitoring: As per clinic protocol. Respiration, ETCO2, SpO2, BP, heart rate and rhythm monitor placed and checked for adequate function Safety Precautions: Patient was assessed for positional comfort and pressure points before starting the procedure. Time-out: I initiated and conducted the "Time-out" before starting the procedure, as per protocol. The patient was asked to participate by confirming the accuracy of the "Time Out" information. Verification of the correct person, site, and procedure were performed and confirmed by me, the nursing staff, and the patient. "Time-out" conducted as per Joint Commission's Universal Protocol (UP.01.01.01). Time: 1019  Description of Procedure:          Target Area: For thoracicFacet block(s), the target is the groove formed by the junction of the transverse process and superior articular process.  Approach: Paraspinal approach. Area Prepped: Entire Upper Back Area DuraPrep (Iodine Povacrylex [0.7% available iodine] and Isopropyl Alcohol, 74% w/w) Safety Precautions: Aspiration looking for blood return was  conducted prior to all injections. At no point did we inject any substances, as a needle was being advanced. No attempts were made at seeking any paresthesias. Safe injection practices and needle disposal techniques used. Medications properly checked for expiration dates. SDV (single dose vial) medications used. Description of the Procedure: Protocol guidelines were followed. The patient was placed in position  over the procedure table. The target area was identified and the area prepped in the usual manner. The skin and muscle were infiltrated with local anesthetic. Appropriate amount of time allowed to pass for local anesthetics to take effect. Radiofrequency needles were introduced to the target area using fluoroscopic guidance. Using the NeuroTherm NT1100 Radiofrequency Generator, sensory stimulation using 50 Hz was used to locate & identify the nerve, making sure that the needle was positioned such that there was no sensory stimulation below 0.3 V or above 0.7 V. Stimulation using 2 Hz was used to evaluate the motor component. Care was taken not to lesion any nerves that demonstrated motor stimulation of the lower extremities at an output of less than 2.5 times that of the sensory threshold, or a maximum of 2.0 V. Once satisfactory placement of the needles was achieved, the numbing solution was slowly injected after negative aspiration. After waiting for at least 2 minutes, the ablation was performed at 80 degrees C for 60 seconds, using regular Radiofrequency settings. Once the procedure was completed, the needles were then removed and the area cleansed, making sure to leave some of the prepping solution back to take advantage of its long term bactericidal properties. Intra-operative Compliance: Compliant        Vitals:   02/20/20 1043 02/20/20 1050 02/20/20 1100 02/20/20 1110  BP: (!) 157/97 140/81 (!) 152/93 129/81  Pulse:      Resp: 15 16 16 16   Temp:  98 F (36.7 C)  98 F (36.7 C)  TempSrc:       SpO2: 97% 98% 98% 97%  Weight:      Height:        Start Time: 1019 hrs. End Time: 1043 hrs. Materials & Medications:  Needle(s) Type: Teflon-coated, curved tip, Radiofrequency needle(s) Gauge: 22G Length: 10cm Medication(s): Please see orders for medications and dosing details. 6 cc solution made of 5 cc of 0.2% ropivacaine, 1 cc of Decadron 10 mg/cc.  2 cc injected at each level on the left after sensorimotor testing, prior to lesioning. Imaging Guidance (Spinal):          Type of Imaging Technique: Fluoroscopy Guidance (Spinal) Indication(s): Assistance in needle guidance and placement for procedures requiring needle placement in or near specific anatomical locations not easily accessible without such assistance. Exposure Time: Please see nurses notes. Contrast: None used. Fluoroscopic Guidance: I was personally present during the use of fluoroscopy. "Tunnel Vision Technique" used to obtain the best possible view of the target area. Parallax error corrected before commencing the procedure. "Direction-depth-direction" technique used to introduce the needle under continuous pulsed fluoroscopy. Once target was reached, antero-posterior, oblique, and lateral fluoroscopic projection used confirm needle placement in all planes. Images permanently stored in EMR. Interpretation: No contrast injected. I personally interpreted the imaging intraoperatively. Adequate needle placement confirmed in multiple planes. Permanent images saved into the patient's record.  Antibiotic Prophylaxis:   Anti-infectives (From admission, onward)   None     Indication(s): None identified  Post-operative Assessment:  Post-procedure Vital Signs:  Pulse/HCG Rate: 6874 Temp: 98 F (36.7 C) Resp: 16 BP: 129/81 SpO2: 97 %  EBL: None  Complications: No immediate post-treatment complications observed by team, or reported by patient.  Note: The patient tolerated the entire procedure well. A repeat set of  vitals were taken after the procedure and the patient was kept under observation following institutional policy, for this type of procedure. Post-procedural neurological assessment was performed, showing return to baseline, prior to discharge. The  patient was provided with post-procedure discharge instructions, including a section on how to identify potential problems. Should any problems arise concerning this procedure, the patient was given instructions to immediately contact us, at any time, without hesitation. In any case, we plan to contact the patient by telephone for a follow-up status report regarding this interventional procedure.  Comments:  No additional relevant information.  Plan of Care  Orders:  Orders Placed This Encounter  Procedures   DG PAIN CLINIC C-ARM 1-60 MIN NO REPORT    Intraoperative interpretation by procedural physician at [facility].    Standing Status:   Standing    Number of Occurrences:   1    Order Specific Question:   Reason for exam:    Answer:   Assistance in needle guidance and placement for procedures requiring needle placement in or near specific anatomical locations not easily accessible without such assistance.   Medications ordered for procedure: Meds ordered this encounter  Medications   lidocaine (XYLOCAINE) 2 % (with pres) injection 400 mg   fentaNYL (SUBLIMAZE) injection 25-50 mcg    Make sure Narcan is available in the pyxis when using this medication. In the event of respiratory depression (RR< 8/min): Titrate NARCAN (naloxone) in increments of 0.1 to 0.2 mg IV at 2-3 minute intervals, until desired degree of reversal.   ropivacaine (PF) 2 mg/mL (0.2%) (NAROPIN) injection 9 mL   dexamethasone (DECADRON) injection 10 mg   Medications administered: We administered lidocaine, fentaNYL, ropivacaine (PF) 2 mg/mL (0.2%), and dexamethasone.  See the medical record for exact dosing, route, and time of administration.  Follow-up  plan:   Return in about 6 weeks (around 04/02/2020) for Post Procedure Evaluation, virtual.      B/L IA Knee steroid 12/12/19: Helpful,  T10, 11, 12 MBB on 12/19/2019 followed by RFA (previosly done with Dr Maryjean Ka in 2018), status post bilateral T10, T11, T12 thoracic facet medial branch nerve block 8/23 2021 which provided 75% pain relief for 3-1/2 weeks with gradual return of pain thereafter.  Right T10, T11, T12 RFA on 01/30/2020; left T10, T11, T12 RFA on 02/20/2020   Recent Visits Date Type Provider Dept  01/30/20 Procedure visit Gillis Santa, MD [facility]  01/12/20 Telemedicine Gillis Santa, MD [facility]  12/19/19 Procedure visit Gillis Santa, MD [facility]  12/12/19 Procedure visit Gillis Santa, MD [facility]  12/08/19 Office Visit Gillis Santa, MD [facility]  Showing recent visits within past 90 days and meeting all other requirements Today's Visits Date Type Provider Dept  02/20/20 Procedure visit Gillis Santa, MD [facility]  Showing today's visits and meeting all other requirements Future Appointments Date Type Provider Dept  04/02/20 Appointment Gillis Santa, MD [facility]  Showing future appointments within next 90 days and meeting all other requirements  Disposition: Discharge home  Discharge (Date   Time): 02/20/2020; 1115 hrs.   Primary Care Physician: Yunis Giovanni, MD Location: [facility] Note by: Gillis Santa, MD Date: 02/20/2020; Time: 2:10 PM  Disclaimer:  Medicine is not an exact science. The only guarantee in medicine is that nothing is guaranteed. It is important to note that the decision to proceed with this intervention was based on the information collected from the patient. The Data and conclusions were drawn from the patient's questionnaire, the interview, and the physical examination. Because the information was provided in large part by the  patient, it cannot be guaranteed that it  has not been purposely or unconsciously manipulated. Every effort has been made to obtain as much relevant data as possible for this evaluation. It is important to note that the conclusions that lead to this procedure are derived in large part from the available data. Always take into account that the treatment will also be dependent on availability of resources and existing treatment guidelines, considered by other Pain Management Practitioners as being common knowledge and practice, at the time of the intervention. For Medico-Legal purposes, it is also important to point out that variation in procedural techniques and pharmacological choices are the acceptable norm. The indications, contraindications, technique, and results of the above procedure should only be interpreted and judged by a Board-Certified Interventional Pain Specialist with extensive familiarity and expertise in the same exact procedure and technique.

## 2020-02-20 NOTE — Patient Instructions (Signed)

## 2020-02-20 NOTE — Progress Notes (Signed)
Safety precautions to be maintained throughout the outpatient stay will include: orient to surroundings, keep bed in low position, maintain call bell within reach at all times, provide assistance with transfer out of bed and ambulation.  

## 2020-02-21 ENCOUNTER — Telehealth: Payer: Self-pay

## 2020-02-21 NOTE — Telephone Encounter (Signed)
Post procedure phone call.  LM 

## 2020-03-29 ENCOUNTER — Encounter: Payer: Self-pay | Admitting: Student in an Organized Health Care Education/Training Program

## 2020-04-02 ENCOUNTER — Ambulatory Visit
Payer: No Typology Code available for payment source | Attending: Student in an Organized Health Care Education/Training Program | Admitting: Student in an Organized Health Care Education/Training Program

## 2020-04-02 ENCOUNTER — Other Ambulatory Visit: Payer: Self-pay

## 2020-04-02 ENCOUNTER — Encounter: Payer: Self-pay | Admitting: Student in an Organized Health Care Education/Training Program

## 2020-04-02 DIAGNOSIS — M47814 Spondylosis without myelopathy or radiculopathy, thoracic region: Secondary | ICD-10-CM

## 2020-04-02 DIAGNOSIS — M47894 Other spondylosis, thoracic region: Secondary | ICD-10-CM | POA: Diagnosis not present

## 2020-04-02 DIAGNOSIS — G894 Chronic pain syndrome: Secondary | ICD-10-CM

## 2020-04-02 NOTE — Progress Notes (Signed)
Patient: Evan Cherry  Service Category: E/M  Provider: Gillis Santa, MD  DOB: [birth year]  DOS: 04/02/2020  Location: Office  MRN: [id number]  Setting: Ambulatory outpatient  Referring Provider: Daden Giovanni, MD  Type: Established Patient  Specialty: Interventional Pain Management  PCP: Ailton Giovanni, MD  Location: Home  Delivery: TeleHealth     Virtual Encounter - Pain Management PROVIDER NOTE: Information contained herein reflects review and annotations entered in association with encounter. Interpretation of such information and data should be left to medically-trained personnel. Information provided to patient can be located elsewhere in the medical record under "Patient Instructions". Document created using STT-dictation technology, any transcriptional errors that may result from process are unintentional.    Contact & Pharmacy Preferred: [phone] Home: [phone] (home) Mobile: [phone] (mobile) E-mail: No e-mail address on record  [facility] [place], [place] - [place] [address] [place] [address] Phone: [phone] Fax: [phone]   Pre-screening  Mr. Penza offered "in-person" vs "virtual" encounter. He indicated preferring virtual for this encounter.   Reason COVID-19*  Social distancing based on CDC and AMA recommendations.   I contacted Evan Cherry on 04/02/2020 via video conference.      I clearly identified myself as Gillis Santa, MD. I verified that I was speaking with the correct person using two identifiers (Name: FERNAND SORBELLO, and date of birth: [birth year]).  Consent I sought verbal advanced consent from Evan Cherry for virtual visit interactions. I informed Mr. Finkler of possible security and privacy concerns, risks, and limitations associated with providing "not-in-person" medical evaluation and management services. I also informed Mr. Flenniken of the availability of  "in-person" appointments. Finally, I informed him that there would be a charge for the virtual visit and that he could be  personally, fully or partially, financially responsible for it. Mr. Self expressed understanding and agreed to proceed.   Historic Elements   Mr. RIDDICK NUON is a 54 y.o. year old, male patient evaluated today after our last contact on 02/20/2020. Mr. Fullam  has a past medical history of Asthma, Back pain, COPD (chronic obstructive pulmonary disease) ([place]), Decreased lung capacity, Hypertension, and Sleep apnea. He also  has no past surgical history on file. Mr. Laseter has a current medication list which includes the following prescription(s): acetaminophen, albuterol, benazepril-hydrochlorthiazide, fasenra, budesonide-formoterol, cyanocobalamin, cyanocobalamin, hydrocodone-acetaminophen, naproxen sodium, pravastatin, testosterone cypionate, tiotropium, tramadol, cholecalciferol, lidocaine, meloxicam, multiple vitamin, and tizanidine. He  reports that he has quit smoking. His smokeless tobacco use includes snuff. He reports that he does not drink alcohol and does not use drugs. Mr. Noguez has No Known Allergies.   HPI  Today, he is being contacted for a post-procedure assessment.  Post-Procedure Evaluation  Procedure (02/20/2020):   Type: Therapeutic Thoracic Medial Nerve Radiofrequency Ablation  #1  Region: Posterior   Level: T10-11, T11-12, T12-L1 and   Medial Branch Level(s)  Right side done 01/30/2020, left side on 02/20/2020   Sedation: Please see nurses note.  Effectiveness during initial hour after procedure(Ultra-Short Term Relief): 0 %   Local anesthetic used: Long-acting (4-6 hours) Effectiveness: Defined as any analgesic benefit obtained secondary to the administration of local anesthetics. This carries significant diagnostic value as to the etiological location, or anatomical origin, of the pain. Duration of benefit is expected to coincide with the duration of the  local anesthetic used.  Effectiveness during initial 4-6 hours after procedure(Short-Term Relief):  0 %   Long-term benefit: Defined as any relief past the pharmacologic duration of the local anesthetics.  Effectiveness past the initial 6 hours after procedure(Long-Term Relief): 50 %  Current benefits: Defined as benefit that persist at this time.   Analgesia:  50% improved Function: Mr. Castell reports improvement in function ROM: Mr. Gitlin reports improvement in ROM   Laboratory Chemistry Profile   Renal Lab Results  Component Value Date   BUN 15 12/09/2014   CREATININE 0.82 12/09/2014   GFRAA >60 12/09/2014   GFRNONAA >60 12/09/2014     Hepatic Lab Results  Component Value Date   AST 25 12/08/2014   ALT 34 12/08/2014   ALBUMIN 4.4 12/08/2014   ALKPHOS 72 12/08/2014   LIPASE 171 12/26/2011     Electrolytes Lab Results  Component Value Date   NA 142 12/09/2014   K 4.2 12/09/2014   CL 107 12/09/2014   CALCIUM 9.2 12/09/2014     Bone No results found for: VD25OH, VD125OH2TOT, KG4010UV2, ZD6644IH4, 25OHVITD1, 25OHVITD2, 25OHVITD3, TESTOFREE, TESTOSTERONE   Inflammation (CRP: Acute Phase) (ESR: Chronic Phase) No results found for: CRP, ESRSEDRATE, LATICACIDVEN     Note: Above Lab results reviewed.   Assessment  The primary encounter diagnosis was Thoracic spondylosis without myelopathy. Diagnoses of Thoracic facet joint syndrome and Chronic pain syndrome were also pertinent to this visit.  Plan of Care   Mr. Mahler is responding well after his T10, T11, T12 radiofrequency ablation.  He states that the analgesic relief with somewhat delayed as he had increased pain in his thoracic region for 5 to 7 days after the ablation procedure however after that he started to notice significant improvement in his mid back pain.  He states that he is able to wake up in the morning without as much pain is able to perform more activities in the morning more comfortably.  We will continue  to monitor his symptoms and have instructed the patient to reach back out when his pain returns to a level that he would like to repeat ablation procedure.  Patient endorsed understanding. Follow-up plan:   Return if symptoms worsen or fail to improve.     B/L IA Knee steroid 12/12/19: Helpful,  T10, 11, 12 MBB on 12/19/2019 followed by RFA (previosly done with Dr Maryjean Ka in 2018), status post bilateral T10, T11, T12 thoracic facet medial branch nerve block 8/23 2021 which provided 75% pain relief for 3-1/2 weeks with gradual return of pain thereafter.  Right T10, T11, T12 RFA on 01/30/2020; left T10, T11, T12 RFA on 02/20/2020: Helped 50%, repeat as needed    Recent Visits Date Type Provider Dept  02/20/20 Procedure visit Gillis Santa, MD [facility]  01/30/20 Procedure visit Gillis Santa, MD [facility]  01/12/20 Telemedicine Gillis Santa, MD [facility]  Showing recent visits within past 90 days and meeting all other requirements Today's Visits Date Type Provider Dept  04/02/20 Telemedicine Gillis Santa, MD [facility]  Showing today's visits and meeting all other requirements Future Appointments No visits were found meeting these conditions. Showing future appointments within next 90 days and meeting all other requirements  I discussed the assessment and treatment plan with the patient. The patient was provided an opportunity to ask questions and all were answered. The patient agreed with the plan and demonstrated an understanding of the instructions.  Patient advised to call back or seek an in-person evaluation if the symptoms or condition worsens.  Duration of encounter:  20 minutes.  Note by: Gillis Santa, MD Date: 04/02/2020; Time: 12:16 PM

## 2020-11-29 ENCOUNTER — Other Ambulatory Visit: Payer: Self-pay

## 2020-11-29 ENCOUNTER — Encounter: Payer: Self-pay | Admitting: Student in an Organized Health Care Education/Training Program

## 2020-11-29 ENCOUNTER — Ambulatory Visit
Payer: No Typology Code available for payment source | Attending: Student in an Organized Health Care Education/Training Program | Admitting: Student in an Organized Health Care Education/Training Program

## 2020-11-29 VITALS — BP 139/71 | HR 76 | Temp 97.2°F | Resp 16 | Ht 68.0 in | Wt 235.0 lb

## 2020-11-29 DIAGNOSIS — M47814 Spondylosis without myelopathy or radiculopathy, thoracic region: Secondary | ICD-10-CM

## 2020-11-29 DIAGNOSIS — M47894 Other spondylosis, thoracic region: Secondary | ICD-10-CM

## 2020-11-29 DIAGNOSIS — G894 Chronic pain syndrome: Secondary | ICD-10-CM | POA: Diagnosis not present

## 2020-11-29 NOTE — Progress Notes (Signed)
PROVIDER NOTE: Information contained herein reflects review and annotations entered in association with encounter. Interpretation of such information and data should be left to medically-trained personnel. Information provided to patient can be located elsewhere in the medical record under "Patient Instructions". Document created using STT-dictation technology, any transcriptional errors that may result from process are unintentional.    Patient: Evan Cherry  Service Category: E/M  Provider: Gillis Santa, MD  DOB: January 04, 1966  DOS: 11/29/2020  Specialty: Interventional Pain Management  MRN: [id number]  Setting: Ambulatory outpatient  PCP: Hershey Giovanni, MD  Type: Established Patient    Referring Provider: Kinan Giovanni, MD  Location: Office  Delivery: Face-to-face     HPI  Mr. MOMODOU CONSIGLIO, a 55 y.o. year old male, is here today because of his Thoracic spondylosis without myelopathy [M47.814]. Mr. Sobieski primary complain today is Back Pain (Mid to lumbar left is a little worse than right ) and Neck Pain (Mid line ) Last encounter: My last encounter with him was on 04/02/20 Pertinent problems: Mr. Holtsclaw has Bilateral post-traumatic osteoarthritis of knee; Chronic pain of both knees; Thoracic facet joint syndrome; Thoracic spondylosis without myelopathy; and Lumbar spondylosis on their pertinent problem list. Pain Assessment: Severity of Chronic pain is reported as a 4 /10. Location: Back Mid, Lower, Left, Right/back pain into hips and legs.  neck pain up the back of the head and into shoulders. Onset: More than a month ago. Quality: Discomfort, Constant, Throbbing. Timing: Constant. Modifying factor(s): ice. previous RFA's.  medications. Vitals:  height is _0  (1.727 m) and weight is 235 lb (106.6 kg). His temporal temperature is 97.2 F (36.2 C) (abnormal). His blood pressure is 139/71 and his pulse is 76. His respiration is 16 and oxygen saturation is 99%.   Reason for encounter: worsening of  previously known (established) problem    Oshen presents today with worsening midthoracic back pain related to thoracic facet arthropathy.  He is status post T10, T11, T12 left RFA on 02/20/2020 and right T10, T11, T12 RFA on 01/30/2020.  He states that the radiofrequency ablation help reduce his pain by approximately 85 to 90% for almost 9 months and now he is having gradual return of pain in his mid thoracic spine.  He states that during these 9 months, he was more functional as well.  Patient is requesting a repeat thoracic radiofrequency ablation, we will start with the left side first.  ROS  Constitutional: Denies any fever or chills Gastrointestinal: No reported hemesis, hematochezia, vomiting, or acute GI distress Musculoskeletal:  mid thoracic pain Neurological: No reported episodes of acute onset apraxia, aphasia, dysarthria, agnosia, amnesia, paralysis, loss of coordination, or loss of consciousness  Medication Review  Benralizumab, Cyanocobalamin, EPINEPHrine, HYDROcodone-acetaminophen, Multiple Vitamin, Tiotropium Bromide Monohydrate, acetaminophen, albuterol, azelastine, benazepril-hydrochlorthiazide, budesonide-formoterol, cholecalciferol, ciclesonide, cyanocobalamin, doxycycline, hydrochlorothiazide, lidocaine, loratadine, meloxicam, naproxen sodium, pravastatin, predniSONE, testosterone cypionate, tiZANidine, tiotropium, and traMADol  History Review  Allergy: Mr. Verrette has No Known Allergies. Drug: Mr. Sanzo  reports no history of drug use. Alcohol:  reports no history of alcohol use. Tobacco:  reports that he has quit smoking. His smokeless tobacco use includes snuff. Social: Mr. Bobrowski  reports that he has quit smoking. His smokeless tobacco use includes snuff. He reports that he does not drink alcohol and does not use drugs. Medical:  has a past medical history of Asthma, Back pain, COPD (chronic obstructive pulmonary disease) ([place]), Decreased lung capacity, Hypertension, and Sleep  apnea. Surgical: Mr. Niswander  has  no past surgical history on file. Family: family history is not on file. He was adopted.  Laboratory Chemistry Profile   Renal Lab Results  Component Value Date   BUN 15 12/09/2014   CREATININE 0.82 12/09/2014   GFRAA >60 12/09/2014   GFRNONAA >60 12/09/2014    Hepatic Lab Results  Component Value Date   AST 25 12/08/2014   ALT 34 12/08/2014   ALBUMIN 4.4 12/08/2014   ALKPHOS 72 12/08/2014   LIPASE 171 12/26/2011    Electrolytes Lab Results  Component Value Date   NA 142 12/09/2014   K 4.2 12/09/2014   CL 107 12/09/2014   CALCIUM 9.2 12/09/2014    Bone No results found for: VD25OH, VD125OH2TOT, RV2023XI3, HW8616OH7, 25OHVITD1, 25OHVITD2, 25OHVITD3, TESTOFREE, TESTOSTERONE  Inflammation (CRP: Acute Phase) (ESR: Chronic Phase) No results found for: CRP, ESRSEDRATE, LATICACIDVEN       Note: Above Lab results reviewed.  Recent Imaging Review  DG PAIN CLINIC C-ARM 1-60 MIN NO REPORT Fluoro was used, but no Radiologist interpretation will be provided.  Please refer to "NOTES" tab for provider progress note. Note: Reviewed        Physical Exam  General appearance: Well nourished, well developed, and well hydrated. In no apparent acute distress Mental status: Alert, oriented x 3 (person, place, & time)       Respiratory: No evidence of acute respiratory distress Eyes: PERLA Vitals: BP 139/71 (BP Location: Right Arm, Patient Position: Sitting, Cuff Size: Large)   Pulse 76   Temp (!) 97.2 F (36.2 C) (Temporal)   Resp 16   Ht _0  (1.727 m)   Wt 235 lb (106.6 kg)   SpO2 99%   BMI 35.73 kg/m  BMI: Estimated body mass index is 35.73 kg/m as calculated from the following:   Height as of this encounter: _1  (1.727 m).   Weight as of this encounter: 235 lb (106.6 kg). Ideal: Ideal body weight: 68.4 kg (150 lb 12.7 oz) Adjusted ideal body weight: 83.7 kg (184 lb 7.6 oz)  Midthoracic back pain most pronounced along T10-T12, pain with  thoracic extension 5 out of 5 strength bilateral lower extremity: Plantar flexion, dorsiflexion, knee flexion, knee extension.   Assessment   Status Diagnosis  Having a Flare-up Having a Flare-up Having a Flare-up 1. Thoracic spondylosis without myelopathy   2. Thoracic facet joint syndrome   3. Chronic pain syndrome      Updated Problems: Problem  Bilateral Post-Traumatic Osteoarthritis of Knee  Chronic Pain of Both Knees  Thoracic Facet Joint Syndrome  Thoracic Spondylosis Without Myelopathy  Lumbar Spondylosis    Plan of Care   No problem-specific Assessment & Plan notes found for this encounter.  Mr. ARVID MARENGO has a current medication list which includes the following long-term medication(s): albuterol, albuterol, azelastine, benazepril-hydrochlorthiazide, ciclesonide, hydrochlorothiazide, loratadine, testosterone cypionate, tiotropium, tiotropium bromide monohydrate, budesonide-formoterol, and pravastatin.  Repeat left T10, T11, T12 RFA with minimal sedation, p.o. Valium followed by right T10, T11, T12 RFA  Orders:  Orders Placed This Encounter  Procedures   Radiofrequency,Thoracic    Standing Status:   Future    Standing Expiration Date:   06/01/2021    Scheduling Instructions:     Side(s): Left     Level(s): T10-11, T11-12, T12-L1 Medial Branch Nerve(s)     Sedation: oral valium     Scheduling Timeframe: As soon as Research scientist (medical) Specific Question:   Where will this procedure be performed?  Answer:   [facility]   Follow-up plan:   Return in about 6 days (around 12/05/2020) for Left T10, T11, T12 RFA , minimal sedation (PO Valium).     B/L IA Knee steroid 12/12/19: Helpful,  T10, 11, 12 MBB on 12/19/2019 followed by RFA (previosly done with Dr Maryjean Ka in 2018), status post bilateral T10, T11, T12 thoracic facet medial branch nerve block 8/23 2021 which provided 75% pain relief for 3-1/2 weeks with gradual return of pain thereafter.  Right T10,  T11, T12 RFA on 01/30/2020; left T10, T11, T12 RFA on 02/20/2020: Helped 50%, repeat as needed     Recent Visits No visits were found meeting these conditions. Showing recent visits within past 90 days and meeting all other requirements Today's Visits Date Type Provider Dept  11/29/20 Office Visit Gillis Santa, MD [facility]  Showing today's visits and meeting all other requirements Future Appointments Date Type Provider Dept  12/05/20 Appointment Gillis Santa, MD [facility]  Showing future appointments within next 90 days and meeting all other requirements I discussed the assessment and treatment plan with the patient. The patient was provided an opportunity to ask questions and all were answered. The patient agreed with the plan and demonstrated an understanding of the instructions.  Patient advised to call back or seek an in-person evaluation if the symptoms or condition worsens.  Duration of encounter: 59mnutes.  Note by: BGillis Santa MD Date: 11/29/2020; Time: 11:02 AM

## 2020-11-29 NOTE — Progress Notes (Signed)
Safety precautions to be maintained throughout the outpatient stay will include: orient to surroundings, keep bed in low position, maintain call bell within reach at all times, provide assistance with transfer out of bed and ambulation.  

## 2020-11-29 NOTE — Patient Instructions (Signed)
____________________________________________________________________________________________  General Risks and Possible Complications  Patient Responsibilities: It is important that you read this as it is part of your informed consent. It is our duty to inform you of the risks and possible complications associated with treatments offered to you. It is your responsibility as a patient to read this and to ask questions about anything that is not clear or that you believe was not covered in this document.  Patient's Rights: You have the right to refuse treatment. You also have the right to change your mind, even after initially having agreed to have the treatment done. However, under this last option, if you wait until the last second to change your mind, you may be charged for the materials used up to that point.  Introduction: Medicine is not an exact science. Everything in Medicine, including the lack of treatment(s), carries the potential for danger, harm, or loss (which is by definition: Risk). In Medicine, a complication is a secondary problem, condition, or disease that can aggravate an already existing one. All treatments carry the risk of possible complications. The fact that a side effects or complications occurs, does not imply that the treatment was conducted incorrectly. It must be clearly understood that these can happen even when everything is done following the highest safety standards.  No treatment: You can choose not to proceed with the proposed treatment alternative. The "PRO(s)" would include: avoiding the risk of complications associated with the therapy. The "CON(s)" would include: not getting any of the treatment benefits. These benefits fall under one of three categories: diagnostic; therapeutic; and/or palliative. Diagnostic benefits include: getting information which can ultimately lead to improvement of the disease or symptom(s). Therapeutic benefits are those associated with the  successful treatment of the disease. Finally, palliative benefits are those related to the decrease of the primary symptoms, without necessarily curing the condition (example: decreasing the pain from a flare-up of a chronic condition, such as incurable terminal cancer).  General Risks and Complications: These are associated to most interventional treatments. They can occur alone, or in combination. They fall under one of the following six (6) categories: no benefit or worsening of symptoms; bleeding; infection; nerve damage; allergic reactions; and/or death. No benefits or worsening of symptoms: In Medicine there are no guarantees, only probabilities. No healthcare provider can ever guarantee that a medical treatment will work, they can only state the probability that it may. Furthermore, there is always the possibility that the condition may worsen, either directly, or indirectly, as a consequence of the treatment. Bleeding: This is more common if the patient is taking a blood thinner, either prescription or over the counter (example: Goody Powders, Fish oil, Aspirin, Garlic, etc.), or if suffering a condition associated with impaired coagulation (example: Hemophilia, cirrhosis of the liver, low platelet counts, etc.). However, even if you do not have one on these, it can still happen. If you have any of these conditions, or take one of these drugs, make sure to notify your treating physician. Infection: This is more common in patients with a compromised immune system, either due to disease (example: diabetes, cancer, human immunodeficiency virus [HIV], etc.), or due to medications or treatments (example: therapies used to treat cancer and rheumatological diseases). However, even if you do not have one on these, it can still happen. If you have any of these conditions, or take one of these drugs, make sure to notify your treating physician. Nerve Damage: This is more common when the treatment is an invasive    one, but it can also happen with the use of medications, such as those used in the treatment of cancer. The damage can occur to small secondary nerves, or to large primary ones, such as those in the spinal cord and brain. This damage may be temporary or permanent and it may lead to impairments that can range from temporary numbness to permanent paralysis and/or brain death. Allergic Reactions: Any time a substance or material comes in contact with our body, there is the possibility of an allergic reaction. These can range from a mild skin rash (contact dermatitis) to a severe systemic reaction (anaphylactic reaction), which can result in death. Death: In general, any medical intervention can result in death, most of the time due to an unforeseen complication. ____________________________________________________________________________________________ Radiofrequency Lesioning Radiofrequency lesioning is a procedure that is performed to relieve pain. The procedure is often used for back, neck, or arm pain. Radiofrequency lesioning involves the use of a machine that creates radio waves to make heat. During the procedure, the heat is applied to the nerve that carries the pain signal. The heat damages the nerve and interferes with the pain signal. Pain relief usuallystarts about 2 weeks after the procedure and lasts for 6 months to 1 year. You will be awake during the procedure. You will need to be able to talk withthe health care provider during the procedure. Tell a health care provider about: Any allergies you have. All medicines you are taking, including vitamins, herbs, eye drops, creams, and over-the-counter medicines. Any problems you or family members have had with anesthetic medicines. Any blood disorders you have. Any surgeries you have had. Any medical conditions you have or have had. Whether you are pregnant or may be pregnant. What are the risks? Generally, this is a safe procedure. However,  problems may occur, including: Pain or soreness at the injection site. Allergic reaction to medicines given during the procedure. Bleeding. Infection at the injection site. Damage to nerves or blood vessels. What happens before the procedure? Staying hydrated Follow instructions from your health care provider about hydration, which may include: Up to 2 hours before the procedure - you may continue to drink clear liquids, such as water, clear fruit juice, black coffee, and plain tea. Eating and drinking Follow instructions from your health care provider about eating and drinking, which may include: 8 hours before the procedure - stop eating heavy meals or foods, such as meat, fried foods, or fatty foods. 6 hours before the procedure - stop eating light meals or foods, such as toast or cereal. 6 hours before the procedure - stop drinking milk or drinks that contain milk. 2 hours before the procedure - stop drinking clear liquids. Medicines Ask your health care provider about: Changing or stopping your regular medicines. This is especially important if you are taking diabetes medicines or blood thinners. Taking medicines such as aspirin and ibuprofen. These medicines can thin your blood. Do not take these medicines unless your health care provider tells you to take them. Taking over-the-counter medicines, vitamins, herbs, and supplements. General instructions Plan to have someone take you home from the hospital or clinic. If you will be going home right after the procedure, plan to have someone with you for 24 hours. Ask your health care provider what steps will be taken to help prevent infection. These may include: Removing hair at the procedure site. Washing skin with a germ-killing soap. Taking antibiotic medicine. What happens during the procedure?  An IV will be inserted into  one of your veins. You will be given one or more of the following: A medicine to help you relax  (sedative). A medicine to numb the area (local anesthetic). Your health care provider will insert a radiofrequency needle into the area to be treated. This is done with the help of a type of X-ray (fluoroscopy). A wire that carries the radio waves (electrode) will be put through the radiofrequency needle. An electrical pulse will be sent through the electrode to verify the correct nerve that is causing your pain. You will feel a tingling sensation, and you may have muscle twitching. The tissue around the needle tip will be heated by an electric current that comes from the radiofrequency machine. This will numb the nerves. The needle will be removed. A bandage (dressing) will be put on the insertion area. The procedure may vary among health care providers and hospitals. What happens after the procedure? Your blood pressure, heart rate, breathing rate, and blood oxygen level will be monitored until you leave the hospital or clinic. Return to your normal activities as told by your health care provider. Ask your health care provider what activities are safe for you. Do not drive for 24 hours if you were given a sedative during your procedure. Summary Radiofrequency lesioning is a procedure that is performed to relieve pain. The procedure is often used for back, neck, or arm pain. Radiofrequency lesioning involves the use of a machine that creates radio waves to make heat. Plan to have someone take you home from the hospital or clinic. Do not drive for 24 hours if you were given a sedative during your procedure. Return to your normal activities as told by your health care provider. Ask your health care provider what activities are safe for you. This information is not intended to replace advice given to you by your health care provider. Make sure you discuss any questions you have with your healthcare provider. Document Revised: 12/31/2017 Document Reviewed: 12/31/2017 Elsevier Patient Education   [place].

## 2020-12-05 ENCOUNTER — Ambulatory Visit (HOSPITAL_BASED_OUTPATIENT_CLINIC_OR_DEPARTMENT_OTHER)
Payer: No Typology Code available for payment source | Admitting: Student in an Organized Health Care Education/Training Program

## 2020-12-05 ENCOUNTER — Encounter: Payer: Self-pay | Admitting: Student in an Organized Health Care Education/Training Program

## 2020-12-05 ENCOUNTER — Ambulatory Visit
Admission: RE | Admit: 2020-12-05 | Discharge: 2020-12-05 | Disposition: A | Discharge status: Home / Self Care | Payer: No Typology Code available for payment source | Source: Ambulatory Visit | Attending: Student in an Organized Health Care Education/Training Program | Admitting: Student in an Organized Health Care Education/Training Program

## 2020-12-05 ENCOUNTER — Other Ambulatory Visit: Payer: Self-pay

## 2020-12-05 VITALS — BP 149/96 | HR 81 | Temp 97.2°F | Resp 16 | Ht 68.0 in | Wt 230.0 lb

## 2020-12-05 DIAGNOSIS — M47814 Spondylosis without myelopathy or radiculopathy, thoracic region: Secondary | ICD-10-CM | POA: Diagnosis present

## 2020-12-05 DIAGNOSIS — M47894 Other spondylosis, thoracic region: Secondary | ICD-10-CM | POA: Insufficient documentation

## 2020-12-05 DIAGNOSIS — M172 Bilateral post-traumatic osteoarthritis of knee: Secondary | ICD-10-CM | POA: Diagnosis present

## 2020-12-05 DIAGNOSIS — G894 Chronic pain syndrome: Secondary | ICD-10-CM

## 2020-12-05 DIAGNOSIS — G8929 Other chronic pain: Secondary | ICD-10-CM

## 2020-12-05 DIAGNOSIS — M25561 Pain in right knee: Secondary | ICD-10-CM | POA: Insufficient documentation

## 2020-12-05 DIAGNOSIS — M25562 Pain in left knee: Secondary | ICD-10-CM | POA: Insufficient documentation

## 2020-12-05 MED ORDER — DEXAMETHASONE SODIUM PHOSPHATE 10 MG/ML IJ SOLN
10.0000 mg | Freq: Once | INTRAMUSCULAR | Status: AC
  Administered 2020-12-05: 10 mg
  Filled 2020-12-05: qty 1

## 2020-12-05 MED ORDER — LIDOCAINE HCL 2 % IJ SOLN
20.0000 mL | Freq: Once | INTRAMUSCULAR | Status: AC
  Administered 2020-12-05: 100 mg

## 2020-12-05 MED ORDER — DIAZEPAM 5 MG PO TABS
ORAL_TABLET | ORAL | Status: AC
  Filled 2020-12-05: qty 1

## 2020-12-05 MED ORDER — ROPIVACAINE HCL 2 MG/ML IJ SOLN
9.0000 mL | Freq: Once | INTRAMUSCULAR | Status: AC
  Administered 2020-12-05: 20 mL via PERINEURAL

## 2020-12-05 MED ORDER — DIAZEPAM 5 MG PO TABS
5.0000 mg | ORAL_TABLET | Freq: Once | ORAL | Status: AC
  Administered 2020-12-05: 5 mg via ORAL

## 2020-12-05 NOTE — Progress Notes (Signed)
Safety precautions to be maintained throughout the outpatient stay will include: orient to surroundings, keep bed in low position, maintain call bell within reach at all times, provide assistance with transfer out of bed and ambulation.  

## 2020-12-05 NOTE — Progress Notes (Signed)
PROVIDER NOTE: Information contained herein reflects review and annotations entered in association with encounter. Interpretation of such information and data should be left to medically-trained personnel. Information provided to patient can be located elsewhere in the medical record under "Patient Instructions". Document created using STT-dictation technology, any transcriptional errors that may result from process are unintentional.    Patient: Evan Cherry  Service Category: Procedure  Provider: Gillis Santa, MD  DOB: [birth year]  DOS: 12/05/2020  Location: [facility]  MRN: [id number]  Setting: Ambulatory - outpatient  Referring Provider: Alfons Giovanni, MD  Type: Established Patient  Specialty: Interventional Pain Management  PCP: Trevon Giovanni, MD   Primary Reason for Visit: Interventional Pain Management Treatment. CC: Back Pain (Mid to low) and Knee Pain (bilat)  Procedure:          Anesthesia, Analgesia, Anxiolysis:  Type: Therapeutic Thoracic Medial Nerve Radiofrequency Ablation  #2  Region: Posterior   Level: T10-11, T11-12, T12-L1 and   Medial Branch Level(s) Laterality: Left-Sided Paraspinal  Type:  Minimal sedation with p.o. Valium Indication(s): Analgesia and Anxiety Route:  PO  Sedation: Meaningful verbal contact was maintained at all times during the procedure  Local Anesthetic: Lidocaine 1-2%  Position: Prone   Indications: 1. Thoracic spondylosis without myelopathy   2. Thoracic facet joint syndrome   3. Chronic pain syndrome   4. Chronic pain of both knees   5. Bilateral post-traumatic osteoarthritis of knee    Evan Cherry has been dealing with the above chronic pain for longer than three months and has either failed to respond, was unable to tolerate, or simply did not get enough benefit from other more conservative therapies including, but not limited to: 1. Over-the-counter medications 2. Anti-inflammatory medications 3. Muscle  relaxants 4. Membrane stabilizers 5. Opioids 6. Physical therapy and/or chiropractic manipulation 7. Modalities (Heat, ice, etc.) 8. Invasive techniques such as nerve blocks. Evan Cherry has attained more than 50% relief of the pain from a series of diagnostic injections conducted in separate occasions.  Pain Score: Pre-procedure: 5 /10 Post-procedure: 3 /10  Pre-op Assessment:  Evan Cherry is a 55 y.o. (year old), male patient, seen today for interventional treatment. He  has no past surgical history on file. Evan Cherry has a current medication list which includes the following prescription(s): acetaminophen, albuterol, albuterol, azelastine, benazepril-hydrochlorthiazide, fasenra, ciclesonide, cyanocobalamin, cyanocobalamin, doxycycline, epinephrine, hydrochlorothiazide, hydrocodone-acetaminophen, lidocaine, loratadine, naproxen sodium, prednisone, testosterone cypionate, tiotropium, tiotropium bromide monohydrate, tramadol, budesonide-formoterol, cholecalciferol, meloxicam, multiple vitamin, pravastatin, and tizanidine. His primarily concern today is the Back Pain (Mid to low) and Knee Pain (bilat)  Initial Vital Signs:  Pulse/HCG Rate: 81ECG Heart Rate: 76 Temp: (!) 97.2 F (36.2 C) Resp: 16 BP: 128/79 SpO2: 100 %  BMI: Estimated body mass index is 34.97 kg/m as calculated from the following:   Height as of this encounter: '5\' 8"'$  (1.727 m).   Weight as of this encounter: 230 lb (104.3 kg).  Risk Assessment: Allergies: Reviewed. He has No Known Allergies.  Allergy Precautions: None required Coagulopathies: Reviewed. None identified.  Blood-thinner therapy: None at this time Active Infection(s): Reviewed. None identified. Evan Cherry is afebrile  Site Confirmation: Evan Cherry was asked to confirm the procedure and laterality before marking the site Procedure checklist: Completed Consent: Before the procedure and under the influence of no sedative(s), amnesic(s), or anxiolytics, the patient  was informed of the treatment options, risks and possible complications. To fulfill our ethical and legal obligations, as recommended by the [employer]'s Code of Ethics, I have informed the patient of my clinical impression; the nature and purpose of the treatment or procedure; the risks, benefits, and possible complications of the intervention; the alternatives, including doing nothing; the risk(s) and benefit(s) of the alternative treatment(s) or procedure(s); and the risk(s) and benefit(s) of doing nothing. The patient was provided information about the general risks and possible complications associated with the procedure. These may include, but are not limited to: failure to achieve desired goals, infection, bleeding, organ or nerve damage, allergic reactions, paralysis, and death. In addition, the patient was informed of those risks and complications associated to Spine-related procedures, such as failure to decrease pain; infection (i.e.: Meningitis, epidural or intraspinal abscess); bleeding (i.e.: epidural hematoma, subarachnoid hemorrhage, or any other type of intraspinal or peri-dural bleeding); organ or nerve damage (i.e.: Any type of peripheral nerve, nerve root, or spinal cord injury) with subsequent damage to sensory, motor, and/or autonomic systems, resulting in permanent pain, numbness, and/or weakness of one or several areas of the body; allergic reactions; (i.e.: anaphylactic reaction); and/or death. Furthermore, the patient was informed of those risks and complications associated with the medications. These include, but are not limited to: allergic reactions (i.e.: anaphylactic or anaphylactoid reaction(s)); adrenal axis suppression; blood sugar elevation that in diabetics may result in ketoacidosis or comma; water retention that in patients with history of congestive heart failure may result in shortness of breath, pulmonary edema, and decompensation with resultant heart  failure; weight gain; swelling or edema; medication-induced neural toxicity; particulate matter embolism and blood vessel occlusion with resultant organ, and/or nervous system infarction; and/or aseptic necrosis of one or more joints. Finally, the patient was informed that Medicine is not an exact science; therefore, there is also the possibility of unforeseen or unpredictable risks and/or possible complications that may result in a catastrophic outcome. The patient indicated having understood very clearly. We have given the patient no guarantees and we have made no promises. Enough time was given to the patient to ask questions, all of which were answered to the patient's satisfaction. Mr. Manetta has indicated that he wanted to continue with the procedure. Attestation: I, the ordering provider, attest that I have discussed with the patient the benefits, risks, side-effects, alternatives, likelihood of achieving goals, and potential problems during recovery for the procedure that I have provided informed consent. Date  Time: 12/05/2020  9:42 AM  Pre-Procedure Preparation:  Monitoring: As per clinic protocol. Respiration, ETCO2, SpO2, BP, heart rate and rhythm monitor placed and checked for adequate function Safety Precautions: Patient was assessed for positional comfort and pressure points before starting the procedure. Time-out: I initiated and conducted the "Time-out" before starting the procedure, as per protocol. The patient was asked to participate by confirming the accuracy of the "Time Out" information. Verification of the correct person, site, and procedure were performed and confirmed by me, the nursing staff, and the patient. "Time-out" conducted as per Joint Commission's Universal Protocol (UP.01.01.01). Time: 1020  Description of Procedure:          Target Area: For thoracicFacet block(s), the target is the groove formed by the junction of the transverse process and superior articular process.   Approach: Paraspinal approach. Area Prepped: Entire Upper Back Area DuraPrep (Iodine Povacrylex [0.7% available iodine] and Isopropyl Alcohol, 74% w/w) Safety Precautions: Aspiration looking for blood return was conducted prior to all injections. At no point did we inject any substances, as a needle was being advanced. No attempts were made at seeking any  paresthesias. Safe injection practices and needle disposal techniques used. Medications properly checked for expiration dates. SDV (single dose vial) medications used. Description of the Procedure: Protocol guidelines were followed. The patient was placed in position over the procedure table. The target area was identified and the area prepped in the usual manner. The skin and muscle were infiltrated with local anesthetic. Appropriate amount of time allowed to pass for local anesthetics to take effect. Radiofrequency needles were introduced to the target area using fluoroscopic guidance. Using the NeuroTherm NT1100 Radiofrequency Generator, sensory stimulation using 50 Hz was used to locate & identify the nerve, making sure that the needle was positioned such that there was no sensory stimulation below 0.3 V or above 0.7 V. Stimulation using 2 Hz was used to evaluate the motor component. Care was taken not to lesion any nerves that demonstrated motor stimulation of the lower extremities at an output of less than 2.5 times that of the sensory threshold, or a maximum of 2.0 V. Once satisfactory placement of the needles was achieved, the numbing solution was slowly injected after negative aspiration. After waiting for at least 2 minutes, the ablation was performed at 80 degrees C for 60 seconds, using regular Radiofrequency settings. Once the procedure was completed, the needles were then removed and the area cleansed, making sure to leave some of the prepping solution back to take advantage of its long term bactericidal properties. Intra-operative Compliance:  Compliant        Vitals:   12/05/20 1025 12/05/20 1030 12/05/20 1035 12/05/20 1039  BP: (!) 144/93 (!) 143/96 (!) 149/101 (!) 149/96  Pulse:      Resp: '18 18 16 16  '$ Temp:      TempSrc:      SpO2: 98% 99% 99% 98%  Weight:      Height:        Start Time: 1021 hrs. End Time: 1038 hrs. Materials & Medications:  Needle(s) Type: Teflon-coated, curved tip, Radiofrequency needle(s) Gauge: 22G Length: 10cm Medication(s): Please see orders for medications and dosing details. 6 cc solution made of 5 cc of 0.2% ropivacaine, 1 cc of Decadron 10 mg/cc.  2 cc injected at each level on the left after sensorimotor testing, prior to lesioning. Imaging Guidance (Spinal):          Type of Imaging Technique: Fluoroscopy Guidance (Spinal) Indication(s): Assistance in needle guidance and placement for procedures requiring needle placement in or near specific anatomical locations not easily accessible without such assistance. Exposure Time: Please see nurses notes. Contrast: None used. Fluoroscopic Guidance: I was personally present during the use of fluoroscopy. "Tunnel Vision Technique" used to obtain the best possible view of the target area. Parallax error corrected before commencing the procedure. "Direction-depth-direction" technique used to introduce the needle under continuous pulsed fluoroscopy. Once target was reached, antero-posterior, oblique, and lateral fluoroscopic projection used confirm needle placement in all planes. Images permanently stored in EMR. Interpretation: No contrast injected. I personally interpreted the imaging intraoperatively. Adequate needle placement confirmed in multiple planes. Permanent images saved into the patient's record.    Post-operative Assessment:  Post-procedure Vital Signs:  Pulse/HCG Rate: 8178 Temp: (!) 97.2 F (36.2 C) Resp: 16 BP: (!) 149/96 SpO2: 98 %  EBL: None  Complications: No immediate post-treatment complications observed by team, or  reported by patient.  Note: The patient tolerated the entire procedure well. A repeat set of vitals were taken after the procedure and the patient was kept under observation following institutional policy, for this  type of procedure. Post-procedural neurological assessment was performed, showing return to baseline, prior to discharge. The patient was provided with post-procedure discharge instructions, including a section on how to identify potential problems. Should any problems arise concerning this procedure, the patient was given instructions to immediately contact us, at any time, without hesitation. In any case, we plan to contact the patient by telephone for a follow-up status report regarding this interventional procedure.  Comments:  No additional relevant information.  Plan of Care   Endorsing bilateral knee pain related to knee osteoarthritis, previous knee steroid injection was 12/12/2019 that provided greater than 80% pain relief for more than 6 months.  Patient would like to repeat intra-articular knee steroid injection given increased knee pain that is worse with weightbearing.  He will also follow-up in 2 weeks for his contralateral thoracic RFA.   Orders:  Orders Placed This Encounter  Procedures   KNEE INJECTION    Local Anesthetic & Steroid injection.    Standing Status:   Future    Standing Expiration Date:   03/07/2021    Scheduling Instructions:     Side: Bilateral     Sedation: None     Timeframe: As soon as schedule allows    Order Specific Question:   Where will this procedure be performed?    Answer:   [facility]   DG [facility] C-ARM 1-60 MIN NO REPORT    Intraoperative interpretation by procedural physician at [facility].    Standing Status:   Standing    Number of Occurrences:   1    Order Specific Question:   Reason for exam:    Answer:   Assistance in needle guidance and placement for procedures requiring needle placement in or near  specific anatomical locations not easily accessible without such assistance.   Medications ordered for procedure: Meds ordered this encounter  Medications   diazepam (VALIUM) tablet 5 mg   lidocaine (XYLOCAINE) 2 % (with pres) injection 400 mg   ropivacaine (PF) 2 mg/mL (0.2%) (NAROPIN) injection 9 mL   dexamethasone (DECADRON) injection 10 mg   dexamethasone (DECADRON) injection 10 mg   Medications administered: We administered diazepam, lidocaine, ropivacaine (PF) 2 mg/mL (0.2%), dexamethasone, and dexamethasone.  See the medical record for exact dosing, route, and time of administration.  Follow-up plan:   Return in about 2 weeks (around 12/19/2020) for 1) Right sided thoracic RFA- order is in place 2) 2 weeks for bilateral  knee injections.      B/L IA Knee steroid 12/12/19: Helpful,  T10, 11, 12 MBB on 12/19/2019 followed by RFA (previosly done with Dr Maryjean Ka in 2018), status post bilateral T10, T11, T12 thoracic facet medial branch nerve block 8/23 2021 which provided 75% pain relief for 3-1/2 weeks with gradual return of pain thereafter.  Right T10, T11, T12 RFA on 01/30/2020; left T10, T11, T12 RFA on 02/20/2020, 12/05/20   Recent Visits Date Type Provider Dept  11/29/20 Office Visit Gillis Santa, MD [facility]  Showing recent visits within past 90 days and meeting all other requirements Today's Visits Date Type Provider Dept  12/05/20 Procedure visit Gillis Santa, MD [facility]  Showing today's visits and meeting all other requirements Future Appointments Date Type Provider Dept  12/12/20 Appointment Gillis Santa, MD [facility]  12/19/20 Appointment Gillis Santa, MD [facility]  Showing future appointments within next 90 days and meeting all other requirements Disposition: Discharge home  Discharge (Date  Time):  12/05/2020; 1055 hrs.   Primary Care Physician: Dalon Giovanni, MD Location: [facility]  Facility Note by: Gillis Santa, MD Date: 12/05/2020; Time: 11:21 AM  Disclaimer:  Medicine is not an exact science. The only guarantee in medicine is that nothing is guaranteed. It is important to note that the decision to proceed with this intervention was based on the information collected from the patient. The Data and conclusions were drawn from the patient's questionnaire, the interview, and the physical examination. Because the information was provided in large part by the patient, it cannot be guaranteed that it has not been purposely or unconsciously manipulated. Every effort has been made to obtain as much relevant data as possible for this evaluation. It is important to note that the conclusions that lead to this procedure are derived in large part from the available data. Always take into account that the treatment will also be dependent on availability of resources and existing treatment guidelines, considered by other Pain Management Practitioners as being common knowledge and practice, at the time of the intervention. For Medico-Legal purposes, it is also important to point out that variation in procedural techniques and pharmacological choices are the acceptable norm. The indications, contraindications, technique, and results of the above procedure should only be interpreted and judged by a Board-Certified Interventional Pain Specialist with extensive familiarity and expertise in the same exact procedure and technique.

## 2020-12-05 NOTE — Patient Instructions (Signed)

## 2020-12-06 ENCOUNTER — Telehealth: Payer: Self-pay

## 2020-12-06 NOTE — Telephone Encounter (Signed)
LM for post procedure phone call.

## 2020-12-12 ENCOUNTER — Encounter: Payer: Self-pay | Admitting: Student in an Organized Health Care Education/Training Program

## 2020-12-12 ENCOUNTER — Ambulatory Visit
Payer: No Typology Code available for payment source | Attending: Student in an Organized Health Care Education/Training Program | Admitting: Student in an Organized Health Care Education/Training Program

## 2020-12-12 ENCOUNTER — Other Ambulatory Visit: Payer: Self-pay

## 2020-12-12 VITALS — BP 128/75 | HR 84 | Temp 98.1°F | Resp 15 | Ht 68.0 in | Wt 230.0 lb

## 2020-12-12 DIAGNOSIS — G8929 Other chronic pain: Secondary | ICD-10-CM | POA: Diagnosis not present

## 2020-12-12 DIAGNOSIS — M172 Bilateral post-traumatic osteoarthritis of knee: Secondary | ICD-10-CM | POA: Diagnosis not present

## 2020-12-12 DIAGNOSIS — M25562 Pain in left knee: Secondary | ICD-10-CM | POA: Insufficient documentation

## 2020-12-12 DIAGNOSIS — M25561 Pain in right knee: Secondary | ICD-10-CM | POA: Diagnosis present

## 2020-12-12 MED ORDER — METHYLPREDNISOLONE ACETATE 40 MG/ML IJ SUSP
40.0000 mg | Freq: Once | INTRAMUSCULAR | Status: AC
  Administered 2020-12-12: 40 mg via INTRA_ARTICULAR
  Filled 2020-12-12: qty 1

## 2020-12-12 MED ORDER — ROPIVACAINE HCL 2 MG/ML IJ SOLN
9.0000 mL | Freq: Once | INTRAMUSCULAR | Status: AC
  Administered 2020-12-12: 9 mL
  Filled 2020-12-12: qty 10

## 2020-12-12 MED ORDER — METHYLPREDNISOLONE ACETATE 40 MG/ML IJ SUSP
10.0000 mg | Freq: Once | INTRAMUSCULAR | Status: DC
  Filled 2020-12-12: qty 1

## 2020-12-12 NOTE — Progress Notes (Signed)
PROVIDER NOTE: Information contained herein reflects review and annotations entered in association with encounter. Interpretation of such information and data should be left to medically-trained personnel. Information provided to patient can be located elsewhere in the medical record under "Patient Instructions". Document created using STT-dictation technology, any transcriptional errors that may result from process are unintentional.    Patient: Evan Cherry  Service Category: Procedure  Provider: Gillis Santa, MD  DOB: 1965-05-08  DOS: 12/12/2020  Location: [facility]  MRN: [id number]  Setting: Ambulatory - outpatient  Referring Provider: Arlynn Giovanni, MD  Type: Established Patient  Specialty: Interventional Pain Management  PCP: Arlando Giovanni, MD   Primary Reason for Visit: Interventional Pain Management Treatment. CC: Knee Pain (bilat)  Procedure:          Anesthesia, Analgesia, Anxiolysis:  Type: Diagnostic Intra-Articular Local anesthetic and steroid Knee Injection #2  Region: Medial infrapatellar Knee Region Level: Knee Joint Laterality: Bilateral  Type: Local Anesthesia Indication(s): Analgesia         Local Anesthetic: Lidocaine 1-2% Route: Infiltration ([place]/IM) IV Access: Declined Sedation: Declined   Position: Sitting   Indications: 1. Chronic pain of both knees   2. Bilateral post-traumatic osteoarthritis of knee    Pain Score: Pre-procedure: 5 /10 Post-procedure: 5 /10   Pre-op Assessment:  Evan Cherry is a 55 y.o. (year old), male patient, seen today for interventional treatment. He  has no past surgical history on file. Evan Cherry has a current medication list which includes the following prescription(s): acetaminophen, albuterol, azelastine, benazepril-hydrochlorthiazide, fasenra, ciclesonide, cyanocobalamin, cyanocobalamin, doxycycline, epinephrine, hydrochlorothiazide, hydrocodone-acetaminophen, lidocaine, loratadine, naproxen sodium,  prednisone, testosterone cypionate, tiotropium, tiotropium bromide monohydrate, tramadol, albuterol, budesonide-formoterol, cholecalciferol, meloxicam, multiple vitamin, pravastatin, and tizanidine, and the following Facility-Administered Medications: methylprednisolone acetate. His primarily concern today is the Knee Pain (bilat)  Initial Vital Signs:  Pulse/HCG Rate: 84  Temp: 98.1 F (36.7 C) Resp: 15 BP: 128/75 SpO2: 97 %  BMI: Estimated body mass index is 34.97 kg/m as calculated from the following:   Height as of this encounter: '5\' 8"'$  (1.727 m).   Weight as of this encounter: 230 lb (104.3 kg).  Risk Assessment: Allergies: Reviewed. He has No Known Allergies.  Allergy Precautions: None required Coagulopathies: Reviewed. None identified.  Blood-thinner therapy: None at this time Active Infection(s): Reviewed. None identified. Evan Cherry is afebrile  Site Confirmation: Evan Cherry was asked to confirm the procedure and laterality before marking the site Procedure checklist: Completed Consent: Before the procedure and under the influence of no sedative(s), amnesic(s), or anxiolytics, the patient was informed of the treatment options, risks and possible complications. To fulfill our ethical and legal obligations, as recommended by the American Medical Association's Code of Ethics, I have informed the patient of my clinical impression; the nature and purpose of the treatment or procedure; the risks, benefits, and possible complications of the intervention; the alternatives, including doing nothing; the risk(s) and benefit(s) of the alternative treatment(s) or procedure(s); and the risk(s) and benefit(s) of doing nothing. The patient was provided information about the general risks and possible complications associated with the procedure. These may include, but are not limited to: failure to achieve desired goals, infection, bleeding, organ or nerve damage, allergic reactions, paralysis, and  death. In addition, the patient was informed of those risks and complications associated to the procedure, such as failure to decrease pain; infection; bleeding; organ or nerve damage with subsequent damage to sensory, motor, and/or autonomic systems, resulting in permanent pain, numbness,  and/or weakness of one or several areas of the body; allergic reactions; (i.e.: anaphylactic reaction); and/or death. Furthermore, the patient was informed of those risks and complications associated with the medications. These include, but are not limited to: allergic reactions (i.e.: anaphylactic or anaphylactoid reaction(s)); adrenal axis suppression; blood sugar elevation that in diabetics may result in ketoacidosis or comma; water retention that in patients with history of congestive heart failure may result in shortness of breath, pulmonary edema, and decompensation with resultant heart failure; weight gain; swelling or edema; medication-induced neural toxicity; particulate matter embolism and blood vessel occlusion with resultant organ, and/or nervous system infarction; and/or aseptic necrosis of one or more joints. Finally, the patient was informed that Medicine is not an exact science; therefore, there is also the possibility of unforeseen or unpredictable risks and/or possible complications that may result in a catastrophic outcome. The patient indicated having understood very clearly. We have given the patient no guarantees and we have made no promises. Enough time was given to the patient to ask questions, all of which were answered to the patient's satisfaction. Evan Cherry has indicated that he wanted to continue with the procedure. Attestation: I, the ordering provider, attest that I have discussed with the patient the benefits, risks, side-effects, alternatives, likelihood of achieving goals, and potential problems during recovery for the procedure that I have provided informed consent. Date  Time: 12/12/2020  10:26 AM  Pre-Procedure Preparation:  Monitoring: As per clinic protocol. Respiration, ETCO2, SpO2, BP, heart rate and rhythm monitor placed and checked for adequate function Safety Precautions: Patient was assessed for positional comfort and pressure points before starting the procedure. Time-out: I initiated and conducted the "Time-out" before starting the procedure, as per protocol. The patient was asked to participate by confirming the accuracy of the "Time Out" information. Verification of the correct person, site, and procedure were performed and confirmed by me, the nursing staff, and the patient. "Time-out" conducted as per Joint Commission's Universal Protocol (UP.01.01.01). Time: 1057  Description of Procedure:          Target Area: Knee Joint Approach: Just above the Medial tibial plateau, lateral to the infrapatellar tendon. Area Prepped: Entire knee area, from the mid-thigh to the mid-shin. DuraPrep (Iodine Povacrylex [0.7% available iodine] and Isopropyl Alcohol, 74% w/w) Safety Precautions: Aspiration looking for blood return was conducted prior to all injections. At no point did we inject any substances, as a needle was being advanced. No attempts were made at seeking any paresthesias. Safe injection practices and needle disposal techniques used. Medications properly checked for expiration dates. SDV (single dose vial) medications used. Description of the Procedure: Protocol guidelines were followed. The patient was placed in position over the fluoroscopy table. The target area was identified and the area prepped in the usual manner. Skin & deeper tissues infiltrated with local anesthetic. Appropriate amount of time allowed to pass for local anesthetics to take effect. The procedure needles were then advanced to the target area. Proper needle placement secured. Negative aspiration confirmed. Solution injected in intermittent fashion, asking for systemic symptoms every 0.5cc of  injectate. The needles were then removed and the area cleansed, making sure to leave some of the prepping solution back to take advantage of its long term bactericidal properties. Vitals:   12/12/20 1029  BP: 128/75  Pulse: 84  Resp: 15  Temp: 98.1 F (36.7 C)  TempSrc: Temporal  SpO2: 97%  Weight: 230 lb (104.3 kg)  Height: '5\' 8"'$  (1.727 m)    Start  Time: 1057 hrs. End Time: 1100 hrs. Materials:  Needle(s) Type: Regular needle Gauge: 25G Length: 1.5-in Medication(s): Please see orders for medications and dosing details. 5 cc solution made of 4 cc of 0.2% ropivacaine, 1 cc of methylprednisolone, 40 mg/cc.  5 cc injected into left intra-articular knee 5 cc solution made of 4 cc of 0.2% ropivacaine, 1 cc of methylprednisolone, 40 mg/cc.  5 cc injected into right intra-articular knee  Post-operative Assessment:  Post-procedure Vital Signs:  Pulse/HCG Rate: 84  Temp: 98.1 F (36.7 C) Resp: 15 BP: 128/75 SpO2: 97 %  EBL: None  Complications: No immediate post-treatment complications observed by team, or reported by patient.  Note: The patient tolerated the entire procedure well. A repeat set of vitals were taken after the procedure and the patient was kept under observation following institutional policy, for this type of procedure. Post-procedural neurological assessment was performed, showing return to baseline, prior to discharge. The patient was provided with post-procedure discharge instructions, including a section on how to identify potential problems. Should any problems arise concerning this procedure, the patient was given instructions to immediately contact us, at any time, without hesitation. In any case, we plan to contact the patient by telephone for a follow-up status report regarding this interventional procedure.  Comments:  No additional relevant information.  Plan of Care   Medications ordered for procedure: Meds ordered this encounter  Medications    ropivacaine (PF) 2 mg/mL (0.2%) (NAROPIN) injection 9 mL   methylPREDNISolone acetate (DEPO-MEDROL) injection 40 mg   methylPREDNISolone acetate (DEPO-MEDROL) injection 10 mg   Medications administered: We administered ropivacaine (PF) 2 mg/mL (0.2%) and methylPREDNISolone acetate.  See the medical record for exact dosing, route, and time of administration.  Follow-up plan:   Return for Keep sch. appt.     B/L IA Knee steroid 12/12/19, 12/13/19, plan for T10, 11, 12 MBB followed by RFA (previosly done with Dr Maryjean Ka in 2019)   Recent Visits Date Type Provider Dept  12/05/20 Procedure visit Gillis Santa, MD [facility]  11/29/20 Office Visit Gillis Santa, MD [facility]  Showing recent visits within past 90 days and meeting all other requirements Today's Visits Date Type Provider Dept  12/12/20 Procedure visit Gillis Santa, MD [facility]  Showing today's visits and meeting all other requirements Future Appointments Date Type Provider Dept  12/19/20 Appointment Gillis Santa, MD [facility]  Showing future appointments within next 90 days and meeting all other requirements Disposition: Discharge home  Discharge (Date  Time): 12/12/2020; 1110 hrs.   Primary Care Physician: Eriq Giovanni, MD Location: [facility] Note by: Gillis Santa, MD Date: 12/12/2020; Time: 2:04 PM  Disclaimer:  Medicine is not an exact science. The only guarantee in medicine is that nothing is guaranteed. It is important to note that the decision to proceed with this intervention was based on the information collected from the patient. The Data and conclusions were drawn from the patient's questionnaire, the interview, and the physical examination. Because the information was provided in large part by the patient, it cannot be guaranteed that it has not been purposely or unconsciously manipulated. Every effort has been made to obtain as  much relevant data as possible for this evaluation. It is important to note that the conclusions that lead to this procedure are derived in large part from the available data. Always take into account that the treatment will also be dependent on availability of resources and existing treatment  guidelines, considered by other Pain Management Practitioners as being common knowledge and practice, at the time of the intervention. For Medico-Legal purposes, it is also important to point out that variation in procedural techniques and pharmacological choices are the acceptable norm. The indications, contraindications, technique, and results of the above procedure should only be interpreted and judged by a Board-Certified Interventional Pain Specialist with extensive familiarity and expertise in the same exact procedure and technique.

## 2020-12-12 NOTE — Progress Notes (Signed)
Safety precautions to be maintained throughout the outpatient stay will include: orient to surroundings, keep bed in low position, maintain call bell within reach at all times, provide assistance with transfer out of bed and ambulation.  

## 2020-12-13 ENCOUNTER — Telehealth: Payer: Self-pay

## 2020-12-13 NOTE — Telephone Encounter (Signed)
Post procedure phone call. Patient states he is doing well.  

## 2020-12-19 ENCOUNTER — Ambulatory Visit (HOSPITAL_BASED_OUTPATIENT_CLINIC_OR_DEPARTMENT_OTHER)
Payer: No Typology Code available for payment source | Admitting: Student in an Organized Health Care Education/Training Program

## 2020-12-19 ENCOUNTER — Other Ambulatory Visit: Payer: Self-pay

## 2020-12-19 ENCOUNTER — Ambulatory Visit
Admission: RE | Admit: 2020-12-19 | Discharge: 2020-12-19 | Disposition: A | Discharge status: Home / Self Care | Payer: No Typology Code available for payment source | Source: Ambulatory Visit | Attending: Student in an Organized Health Care Education/Training Program | Admitting: Student in an Organized Health Care Education/Training Program

## 2020-12-19 ENCOUNTER — Encounter: Payer: Self-pay | Admitting: Student in an Organized Health Care Education/Training Program

## 2020-12-19 VITALS — BP 155/92 | HR 73 | Temp 97.2°F | Resp 18 | Ht 68.0 in | Wt 230.0 lb

## 2020-12-19 DIAGNOSIS — M47814 Spondylosis without myelopathy or radiculopathy, thoracic region: Secondary | ICD-10-CM | POA: Insufficient documentation

## 2020-12-19 DIAGNOSIS — G8929 Other chronic pain: Secondary | ICD-10-CM | POA: Diagnosis present

## 2020-12-19 DIAGNOSIS — M25562 Pain in left knee: Secondary | ICD-10-CM | POA: Diagnosis present

## 2020-12-19 DIAGNOSIS — M47894 Other spondylosis, thoracic region: Secondary | ICD-10-CM | POA: Insufficient documentation

## 2020-12-19 DIAGNOSIS — M25561 Pain in right knee: Secondary | ICD-10-CM | POA: Insufficient documentation

## 2020-12-19 DIAGNOSIS — G894 Chronic pain syndrome: Secondary | ICD-10-CM

## 2020-12-19 MED ORDER — LIDOCAINE HCL (PF) 2 % IJ SOLN
INTRAMUSCULAR | Status: AC
  Filled 2020-12-19: qty 20

## 2020-12-19 MED ORDER — DEXAMETHASONE SODIUM PHOSPHATE 10 MG/ML IJ SOLN
10.0000 mg | Freq: Once | INTRAMUSCULAR | Status: AC
  Administered 2020-12-19: 10 mg

## 2020-12-19 MED ORDER — DEXAMETHASONE SODIUM PHOSPHATE 10 MG/ML IJ SOLN
10.0000 mg | Freq: Once | INTRAMUSCULAR | Status: AC
  Administered 2020-12-19: 10 mg
  Filled 2020-12-19: qty 1

## 2020-12-19 MED ORDER — ROPIVACAINE HCL 2 MG/ML IJ SOLN
9.0000 mL | Freq: Once | INTRAMUSCULAR | Status: AC
  Administered 2020-12-19: 9 mL via PERINEURAL

## 2020-12-19 MED ORDER — DIAZEPAM 5 MG PO TABS
10.0000 mg | ORAL_TABLET | Freq: Once | ORAL | Status: AC
  Administered 2020-12-19: 5 mg via ORAL

## 2020-12-19 MED ORDER — DEXAMETHASONE SODIUM PHOSPHATE 10 MG/ML IJ SOLN
INTRAMUSCULAR | Status: AC
  Filled 2020-12-19: qty 1

## 2020-12-19 MED ORDER — ROPIVACAINE HCL 2 MG/ML IJ SOLN
INTRAMUSCULAR | Status: AC
  Filled 2020-12-19: qty 20

## 2020-12-19 MED ORDER — DIAZEPAM 5 MG PO TABS
ORAL_TABLET | ORAL | Status: AC
  Filled 2020-12-19: qty 1

## 2020-12-19 MED ORDER — LIDOCAINE HCL 2 % IJ SOLN
20.0000 mL | Freq: Once | INTRAMUSCULAR | Status: AC
  Administered 2020-12-19: 400 mg

## 2020-12-19 NOTE — Progress Notes (Signed)
PROVIDER NOTE: Information contained herein reflects review and annotations entered in association with encounter. Interpretation of such information and data should be left to medically-trained personnel. Information provided to patient can be located elsewhere in the medical record under "Patient Instructions". Document created using STT-dictation technology, any transcriptional errors that may result from process are unintentional.    Patient: Evan Cherry  Service Category: Procedure  Provider: Gillis Santa, MD  DOB: 09-09-1965  DOS: 12/19/2020  Location: [facility]  MRN: [id number]  Setting: Ambulatory - outpatient  Referring Provider: Nyjah Giovanni, MD  Type: Established Patient  Specialty: Interventional Pain Management  PCP: Delando Giovanni, MD   Primary Reason for Visit: Interventional Pain Management Treatment. CC: Back Pain (Right mid back pain )  Procedure:          Anesthesia, Analgesia, Anxiolysis:  Type: Therapeutic Thoracic Medial Nerve Radiofrequency Ablation  #2  Region: Posterior   Level: T10-11, T11-12, and T12-L1 Medial Branch Level(s) Laterality: Right-Sided Paraspinal  Type:  Minimal sedation with p.o. Valium Indication(s): Analgesia and Anxiety Route:  PO  Sedation: Meaningful verbal contact was maintained at all times during the procedure  Local Anesthetic: Lidocaine 1-2%  Position: Prone   Indications: 1. Thoracic spondylosis without myelopathy   2. Thoracic facet joint syndrome   3. Chronic pain of both knees   4. Chronic pain syndrome     Evan Cherry has been dealing with the above chronic pain for longer than three months and has either failed to respond, was unable to tolerate, or simply did not get enough benefit from other more conservative therapies including, but not limited to: 1. Over-the-counter medications 2. Anti-inflammatory medications 3. Muscle relaxants 4. Membrane stabilizers 5. Opioids 6. Physical therapy and/or  chiropractic manipulation 7. Modalities (Heat, ice, etc.) 8. Invasive techniques such as nerve blocks. Evan Cherry has attained more than 50% relief of the pain from a series of diagnostic injections conducted in separate occasions.  Pain Score: Pre-procedure: 5 /10 Post-procedure: 2 /10  Pre-op Assessment:  Evan Cherry is a 55 y.o. (year old), male patient, seen today for interventional treatment. He  has no past surgical history on file. Evan Cherry has a current medication list which includes the following prescription(s): acetaminophen, albuterol, albuterol, azelastine, benazepril, benazepril-hydrochlorthiazide, fasenra, ciclesonide, cyanocobalamin, cyanocobalamin, doxycycline, epinephrine, hydrochlorothiazide, hydrocodone-acetaminophen, lidocaine, loratadine, naproxen sodium, testosterone cypionate, tiotropium, tiotropium bromide monohydrate, tramadol, budesonide-formoterol, carboxymethylcellulose sodium, cholecalciferol, meloxicam, multiple vitamin, pravastatin, prednisone, and tizanidine. His primarily concern today is the Back Pain (Right mid back pain )  Initial Vital Signs:  Pulse/HCG Rate: 75  Temp: (!) 97.2 F (36.2 C) Resp: 16 BP: (!) 144/78 SpO2: 99 %  BMI: Estimated body mass index is 34.97 kg/m as calculated from the following:   Height as of this encounter: '5\' 8"'$  (1.727 m).   Weight as of this encounter: 230 lb (104.3 kg).  Risk Assessment: Allergies: Reviewed. He has No Known Allergies.  Allergy Precautions: None required Coagulopathies: Reviewed. None identified.  Blood-thinner therapy: None at this time Active Infection(s): Reviewed. None identified. Evan Cherry is afebrile  Site Confirmation: Evan Cherry was asked to confirm the procedure and laterality before marking the site Procedure checklist: Completed Consent: Before the procedure and under the influence of no sedative(s), amnesic(s), or anxiolytics, the patient was informed of the treatment options, risks and possible  complications. To fulfill our ethical and legal obligations, as recommended by the American Medical Association's Code of Ethics, I have informed the patient of my  clinical impression; the nature and purpose of the treatment or procedure; the risks, benefits, and possible complications of the intervention; the alternatives, including doing nothing; the risk(s) and benefit(s) of the alternative treatment(s) or procedure(s); and the risk(s) and benefit(s) of doing nothing. The patient was provided information about the general risks and possible complications associated with the procedure. These may include, but are not limited to: failure to achieve desired goals, infection, bleeding, organ or nerve damage, allergic reactions, paralysis, and death. In addition, the patient was informed of those risks and complications associated to Spine-related procedures, such as failure to decrease pain; infection (i.e.: Meningitis, epidural or intraspinal abscess); bleeding (i.e.: epidural hematoma, subarachnoid hemorrhage, or any other type of intraspinal or peri-dural bleeding); organ or nerve damage (i.e.: Any type of peripheral nerve, nerve root, or spinal cord injury) with subsequent damage to sensory, motor, and/or autonomic systems, resulting in permanent pain, numbness, and/or weakness of one or several areas of the body; allergic reactions; (i.e.: anaphylactic reaction); and/or death. Furthermore, the patient was informed of those risks and complications associated with the medications. These include, but are not limited to: allergic reactions (i.e.: anaphylactic or anaphylactoid reaction(s)); adrenal axis suppression; blood sugar elevation that in diabetics may result in ketoacidosis or comma; water retention that in patients with history of congestive heart failure may result in shortness of breath, pulmonary edema, and decompensation with resultant heart failure; weight gain; swelling or edema; medication-induced  neural toxicity; particulate matter embolism and blood vessel occlusion with resultant organ, and/or nervous system infarction; and/or aseptic necrosis of one or more joints. Finally, the patient was informed that Medicine is not an exact science; therefore, there is also the possibility of unforeseen or unpredictable risks and/or possible complications that may result in a catastrophic outcome. The patient indicated having understood very clearly. We have given the patient no guarantees and we have made no promises. Enough time was given to the patient to ask questions, all of which were answered to the patient's satisfaction. Mr. Overman has indicated that he wanted to continue with the procedure. Attestation: I, the ordering provider, attest that I have discussed with the patient the benefits, risks, side-effects, alternatives, likelihood of achieving goals, and potential problems during recovery for the procedure that I have provided informed consent. Date  Time: 12/19/2020  9:48 AM  Pre-Procedure Preparation:  Monitoring: As per clinic protocol. Respiration, ETCO2, SpO2, BP, heart rate and rhythm monitor placed and checked for adequate function Safety Precautions: Patient was assessed for positional comfort and pressure points before starting the procedure. Time-out: I initiated and conducted the "Time-out" before starting the procedure, as per protocol. The patient was asked to participate by confirming the accuracy of the "Time Out" information. Verification of the correct person, site, and procedure were performed and confirmed by me, the nursing staff, and the patient. "Time-out" conducted as per Joint Commission's Universal Protocol (UP.01.01.01). Time: 1022  Description of Procedure:          Target Area: For thoracicFacet block(s), the target is the groove formed by the junction of the transverse process and superior articular process.  Approach: Paraspinal approach. Area Prepped: Entire Upper  Back Area DuraPrep (Iodine Povacrylex [0.7% available iodine] and Isopropyl Alcohol, 74% w/w) Safety Precautions: Aspiration looking for blood return was conducted prior to all injections. At no point did we inject any substances, as a needle was being advanced. No attempts were made at seeking any paresthesias. Safe injection practices and needle disposal techniques used. Medications properly  checked for expiration dates. SDV (single dose vial) medications used. Description of the Procedure: Protocol guidelines were followed. The patient was placed in position over the procedure table. The target area was identified and the area prepped in the usual manner. The skin and muscle were infiltrated with local anesthetic. Appropriate amount of time allowed to pass for local anesthetics to take effect. Radiofrequency needles were introduced to the target area using fluoroscopic guidance. Using the NeuroTherm NT1100 Radiofrequency Generator, sensory stimulation using 50 Hz was used to locate & identify the nerve, making sure that the needle was positioned such that there was no sensory stimulation below 0.3 V or above 0.7 V. Stimulation using 2 Hz was used to evaluate the motor component. Care was taken not to lesion any nerves that demonstrated motor stimulation of the lower extremities at an output of less than 2.5 times that of the sensory threshold, or a maximum of 2.0 V. Once satisfactory placement of the needles was achieved, the numbing solution was slowly injected after negative aspiration. After waiting for at least 2 minutes, the ablation was performed at 80 degrees C for 60 seconds, using regular Radiofrequency settings. Once the procedure was completed, the needles were then removed and the area cleansed, making sure to leave some of the prepping solution back to take advantage of its long term bactericidal properties. Intra-operative Compliance: Compliant        Vitals:   12/19/20 1025 12/19/20 1030  12/19/20 1035 12/19/20 1042  BP: (!) 151/80 (!) 146/92 (!) 157/89 (!) 155/92  Pulse: 76 78 76 73  Resp: '16 15 16 18  '$ Temp:      TempSrc:      SpO2: 97% 98% 97% 98%  Weight:      Height:        Start Time: 1022 hrs. End Time: 1037 hrs. Materials & Medications:  Needle(s) Type: Teflon-coated, curved tip, Radiofrequency needle(s) Gauge: 22G Length: 10cm Medication(s): Please see orders for medications and dosing details. 6 cc solution made of 4 cc of 0.2% ropivacaine, 2 cc of Decadron 10 mg/cc.  2 cc injected at each level on the left after sensorimotor testing, prior to lesioning. Imaging Guidance (Spinal):          Type of Imaging Technique: Fluoroscopy Guidance (Spinal) Indication(s): Assistance in needle guidance and placement for procedures requiring needle placement in or near specific anatomical locations not easily accessible without such assistance. Exposure Time: Please see nurses notes. Contrast: None used. Fluoroscopic Guidance: I was personally present during the use of fluoroscopy. "Tunnel Vision Technique" used to obtain the best possible view of the target area. Parallax error corrected before commencing the procedure. "Direction-depth-direction" technique used to introduce the needle under continuous pulsed fluoroscopy. Once target was reached, antero-posterior, oblique, and lateral fluoroscopic projection used confirm needle placement in all planes. Images permanently stored in EMR. Interpretation: No contrast injected. I personally interpreted the imaging intraoperatively. Adequate needle placement confirmed in multiple planes. Permanent images saved into the patient's record.    Post-operative Assessment:  Post-procedure Vital Signs:  Pulse/HCG Rate: 73 (nsr)  Temp: (!) 97.2 F (36.2 C) Resp: 18 BP: (!) 155/92 SpO2: 98 %  EBL: None  Complications: No immediate post-treatment complications observed by team, or reported by patient.  Note: The patient tolerated  the entire procedure well. A repeat set of vitals were taken after the procedure and the patient was kept under observation following institutional policy, for this type of procedure. Post-procedural neurological assessment was performed, showing  return to baseline, prior to discharge. The patient was provided with post-procedure discharge instructions, including a section on how to identify potential problems. Should any problems arise concerning this procedure, the patient was given instructions to immediately contact us, at any time, without hesitation. In any case, we plan to contact the patient by telephone for a follow-up status report regarding this interventional procedure.  Comments:  No additional relevant information.  Plan of Care    Orders:  Orders Placed This Encounter  Procedures   DG PAIN CLINIC C-ARM 1-60 MIN NO REPORT    Intraoperative interpretation by procedural physician at [facility].    Standing Status:   Standing    Number of Occurrences:   1    Order Specific Question:   Reason for exam:    Answer:   Assistance in needle guidance and placement for procedures requiring needle placement in or near specific anatomical locations not easily accessible without such assistance.    Medications ordered for procedure: Meds ordered this encounter  Medications   lidocaine (XYLOCAINE) 2 % (with pres) injection 400 mg   dexamethasone (DECADRON) injection 10 mg   dexamethasone (DECADRON) injection 10 mg   ropivacaine (PF) 2 mg/mL (0.2%) (NAROPIN) injection 9 mL    Medications administered: We administered lidocaine, dexamethasone, dexamethasone, and ropivacaine (PF) 2 mg/mL (0.2%).  See the medical record for exact dosing, route, and time of administration.  Follow-up plan:   Return in about 6 weeks (around 01/30/2021) for Post Procedure Evaluation, virtual.      B/L IA Knee steroid 12/12/19: Helpful,  T10, 11, 12 MBB on 12/19/2019 followed by RFA (previosly done  with Dr Maryjean Ka in 2018), status post bilateral T10, T11, T12 thoracic facet medial branch nerve block 8/23 2021 which provided 75% pain relief for 3-1/2 weeks with gradual return of pain thereafter.  Right T10, T11, T12 RFA on 01/30/2020; left T10, T11, T12 RFA on 02/20/2020, 12/05/20,  T10, T11, T12 RFA 12/19/2020    Recent Visits Date Type Provider Dept  12/12/20 Procedure visit Gillis Santa, MD [facility]  12/05/20 Procedure visit Gillis Santa, MD [facility]  11/29/20 Office Visit Gillis Santa, MD [facility]  Showing recent visits within past 90 days and meeting all other requirements Today's Visits Date Type Provider Dept  12/19/20 Procedure visit Gillis Santa, MD [facility]  Showing today's visits and meeting all other requirements Future Appointments Date Type Provider Dept  01/30/21 Appointment Gillis Santa, MD [facility]  Showing future appointments within next 90 days and meeting all other requirements Disposition: Discharge home  Discharge (Date  Time): 12/19/2020; 1045 hrs.   Primary Care Physician: Donaldson Giovanni, MD Location: [facility] Note by: Gillis Santa, MD Date: 12/19/2020; Time: 11:05 AM  Disclaimer:  Medicine is not an exact science. The only guarantee in medicine is that nothing is guaranteed. It is important to note that the decision to proceed with this intervention was based on the information collected from the patient. The Data and conclusions were drawn from the patient's questionnaire, the interview, and the physical examination. Because the information was provided in large part by the patient, it cannot be guaranteed that it has not been purposely or unconsciously manipulated. Every effort has been made to obtain as much relevant data as possible for this evaluation. It is important to note that the conclusions that lead to this procedure are derived in large part from  the available data.  Always take into account that the treatment will also be dependent on availability of resources and existing treatment guidelines, considered by other Pain Management Practitioners as being common knowledge and practice, at the time of the intervention. For Medico-Legal purposes, it is also important to point out that variation in procedural techniques and pharmacological choices are the acceptable norm. The indications, contraindications, technique, and results of the above procedure should only be interpreted and judged by a Board-Certified Interventional Pain Specialist with extensive familiarity and expertise in the same exact procedure and technique.

## 2020-12-19 NOTE — Progress Notes (Signed)
Safety precautions to be maintained throughout the outpatient stay will include: orient to surroundings, keep bed in low position, maintain call bell within reach at all times, provide assistance with transfer out of bed and ambulation.  

## 2020-12-19 NOTE — Addendum Note (Signed)
Addended by: Landis Martins on: 12/19/2020 01:53 PM   Modules accepted: Orders

## 2020-12-20 ENCOUNTER — Telehealth: Payer: Self-pay

## 2020-12-20 NOTE — Telephone Encounter (Signed)
Post procedure phone call.  Patient states he is doing good today.

## 2021-01-30 ENCOUNTER — Other Ambulatory Visit: Payer: Self-pay

## 2021-01-30 ENCOUNTER — Ambulatory Visit
Payer: No Typology Code available for payment source | Attending: Student in an Organized Health Care Education/Training Program | Admitting: Student in an Organized Health Care Education/Training Program

## 2021-01-30 DIAGNOSIS — M25562 Pain in left knee: Secondary | ICD-10-CM

## 2021-01-30 DIAGNOSIS — M47816 Spondylosis without myelopathy or radiculopathy, lumbar region: Secondary | ICD-10-CM | POA: Diagnosis not present

## 2021-01-30 DIAGNOSIS — M47894 Other spondylosis, thoracic region: Secondary | ICD-10-CM | POA: Diagnosis not present

## 2021-01-30 DIAGNOSIS — M47814 Spondylosis without myelopathy or radiculopathy, thoracic region: Secondary | ICD-10-CM | POA: Diagnosis not present

## 2021-01-30 DIAGNOSIS — M25561 Pain in right knee: Secondary | ICD-10-CM | POA: Diagnosis not present

## 2021-01-30 DIAGNOSIS — G894 Chronic pain syndrome: Secondary | ICD-10-CM

## 2021-01-30 DIAGNOSIS — G8929 Other chronic pain: Secondary | ICD-10-CM

## 2021-01-30 NOTE — Progress Notes (Signed)
Patient: Evan Cherry  Service Category: E/M  Provider: Gillis Santa, MD  DOB: Jul 03, 1965  DOS: 01/30/2021  Location: Office  MRN: [id number]  Setting: Ambulatory outpatient  Referring Provider: Dugan Giovanni, MD  Type: Established Patient  Specialty: Interventional Pain Management  PCP: Vaughan Giovanni, MD  Location: Home  Delivery: TeleHealth     Virtual Encounter - Pain Management PROVIDER NOTE: Information contained herein reflects review and annotations entered in association with encounter. Interpretation of such information and data should be left to medically-trained personnel. Information provided to patient can be located elsewhere in the medical record under "Patient Instructions". Document created using STT-dictation technology, any transcriptional errors that may result from process are unintentional.    Contact & Pharmacy Preferred: [phone] Home: [phone] (home) Mobile: [phone] (mobile) E-mail: No e-mail address on record  [facility] [place], [place] [address] [place] [address] Phone: [phone] Fax: [place], [place] [address] [place] [address] Phone: [phone] Fax: [phone]   Pre-screening  Evan Cherry offered "in-person" vs "virtual" encounter. He indicated preferring virtual for this encounter.   Reason COVID-19*  Social distancing based on CDC and AMA recommendations.   I contacted Evan Cherry on 01/30/2021 via telephone.      I clearly identified myself as Gillis Santa, MD. I verified that I was speaking with the correct person using two identifiers (Name: Evan Cherry, and date of birth: June 26, 1965).  Consent I sought verbal advanced consent from Evan Cherry for virtual visit interactions. I informed Evan Cherry of possible security and privacy concerns, risks, and limitations associated  with providing "not-in-person" medical evaluation and management services. I also informed Evan Cherry of the availability of "in-person" appointments. Finally, I informed him that there would be a charge for the virtual visit and that he could be  personally, fully or partially, financially responsible for it. Evan Cherry expressed understanding and agreed to proceed.   Historic Elements   Evan Cherry is a 55 y.o. year old, male patient evaluated today after our last contact on 12/19/2020. Evan Cherry  has a past medical history of Asthma, Back pain, COPD (chronic obstructive pulmonary disease) ([place]), Decreased lung capacity, Hypertension, and Sleep apnea. He also  has no past surgical history on file. Evan Cherry has a current medication list which includes the following prescription(s): acetaminophen, albuterol, albuterol, azelastine, benazepril, benazepril-hydrochlorthiazide, fasenra, ciclesonide, cyanocobalamin, cyanocobalamin, cyclobenzaprine, doxycycline, epinephrine, hydrochlorothiazide, hydrocodone-acetaminophen, lidocaine, loratadine, naproxen sodium, prednisone, testosterone cypionate, tiotropium, tiotropium bromide monohydrate, tramadol, budesonide-formoterol, carboxymethylcellulose sodium, cholecalciferol, meloxicam, multiple vitamin, pravastatin, and tizanidine. He  reports that he has quit smoking. His smokeless tobacco use includes snuff. He reports that he does not drink alcohol and does not use drugs. Evan Cherry has No Known Allergies.   HPI  Today, he is being contacted for a post-procedure assessment.   Post-Procedure Evaluation  Procedure (12/19/2020):   Type: Therapeutic Thoracic Medial Nerve Radiofrequency Ablation  #2  Region: Posterior   Level: T10-11, T11-12, and T12-L1 Medial Branch Level(s)  Right: 12/19/20, Left 12/05/20   Anxiolysis: Please see nurses note.  Effectiveness during initial hour after procedure (Ultra-Short Term Relief): 100 %   Local anesthetic used: Long-acting  (4-6 hours) Effectiveness: Defined as any analgesic benefit obtained secondary to the administration of local anesthetics. This carries significant diagnostic value as to the etiological  location, or anatomical origin, of the pain. Duration of benefit is expected to coincide with the duration of the local anesthetic used.  Effectiveness during initial 4-6 hours after procedure (Short-Term Relief): 50 %   Long-term benefit: Defined as any relief past the pharmacologic duration of the local anesthetics.  Effectiveness past the initial 6 hours after procedure (Long-Term Relief): 50 %   Benefits, current: Defined as benefit present at the time of this evaluation.   Analgesia:  50%   Laboratory Chemistry Profile   Renal Lab Results  Component Value Date   BUN 15 12/09/2014   CREATININE 0.82 12/09/2014   GFRAA >60 12/09/2014   GFRNONAA >60 12/09/2014    Hepatic Lab Results  Component Value Date   AST 25 12/08/2014   ALT 34 12/08/2014   ALBUMIN 4.4 12/08/2014   ALKPHOS 72 12/08/2014   LIPASE 171 12/26/2011    Electrolytes Lab Results  Component Value Date   NA 142 12/09/2014   K 4.2 12/09/2014   CL 107 12/09/2014   CALCIUM 9.2 12/09/2014    Bone No results found for: VD25OH, VD125OH2TOT, VD3125OH2, VD2125OH2, 25OHVITD1, 25OHVITD2, 25OHVITD3, TESTOFREE, TESTOSTERONE  Inflammation (CRP: Acute Phase) (ESR: Chronic Phase) No results found for: CRP, ESRSEDRATE, LATICACIDVEN       Note: Above Lab results reviewed.   Assessment  The primary encounter diagnosis was Thoracic spondylosis without myelopathy. Diagnoses of Thoracic facet joint syndrome, Chronic pain of both knees, Lumbar spondylosis, and Chronic pain syndrome were also pertinent to this visit.  Plan of Care  Patient is endorsing moderate benefit after his bilateral thoracic RFA.  He states that he may have overexerted himself with increased activity and working in his yard on his tractor afterwards given that he was  feeling better.  We will continue to monitor his symptoms and can consider repeating RFA after February.  Patient in agreement with plan.  Follow-up plan:   No follow-ups on file.     B/L IA Knee steroid 12/12/19: Helpful,  T10, 11, 12 MBB on 12/19/2019 followed by RFA (previosly done with Dr Harkins in 2018), status post bilateral T10, T11, T12 thoracic facet medial branch nerve block 8/23 2021 which provided 75% pain relief for 3-1/2 weeks with gradual return of pain thereafter.  Right T10, T11, T12 RFA on 01/30/2020; left T10, T11, T12 RFA on 02/20/2020, 12/05/20,  T10, T11, T12 RFA 12/19/2020     Recent Visits Date Type Provider Dept  12/19/20 Procedure visit Lateef, Bilal, MD [facility]  12/12/20 Procedure visit Lateef, Bilal, MD [facility]  12/05/20 Procedure visit Lateef, Bilal, MD [facility]  11/29/20 Office Visit Lateef, Bilal, MD [facility]  Showing recent visits within past 90 days and meeting all other requirements Today's Visits Date Type Provider Dept  01/30/21 Telemedicine Lateef, Bilal, MD [facility]  Showing today's visits and meeting all other requirements Future Appointments No visits were found meeting these conditions. Showing future appointments within next 90 days and meeting all other requirements I discussed the assessment and treatment plan with the patient. The patient was provided an opportunity to ask questions and all were answered. The patient agreed with the plan and demonstrated an understanding of the instructions.  Patient advised to call back or seek an in-person evaluation if the symptoms or condition worsens.  Duration of encounter: 20minutes.  Note by: Bilal Lateef, MD Date: 01/30/2021; Time: 2:18 PM  

## 2021-07-16 ENCOUNTER — Encounter: Payer: Self-pay | Admitting: Student in an Organized Health Care Education/Training Program

## 2021-07-16 ENCOUNTER — Ambulatory Visit
Payer: No Typology Code available for payment source | Attending: Student in an Organized Health Care Education/Training Program | Admitting: Student in an Organized Health Care Education/Training Program

## 2021-07-16 ENCOUNTER — Other Ambulatory Visit: Payer: Self-pay

## 2021-07-16 VITALS — BP 138/74 | HR 95 | Temp 98.6°F | Resp 18 | Ht 67.0 in | Wt 236.0 lb

## 2021-07-16 DIAGNOSIS — M47894 Other spondylosis, thoracic region: Secondary | ICD-10-CM

## 2021-07-16 DIAGNOSIS — M25561 Pain in right knee: Secondary | ICD-10-CM | POA: Diagnosis present

## 2021-07-16 DIAGNOSIS — M47814 Spondylosis without myelopathy or radiculopathy, thoracic region: Secondary | ICD-10-CM

## 2021-07-16 DIAGNOSIS — M172 Bilateral post-traumatic osteoarthritis of knee: Secondary | ICD-10-CM | POA: Diagnosis present

## 2021-07-16 DIAGNOSIS — M25562 Pain in left knee: Secondary | ICD-10-CM | POA: Insufficient documentation

## 2021-07-16 DIAGNOSIS — G894 Chronic pain syndrome: Secondary | ICD-10-CM

## 2021-07-16 DIAGNOSIS — G8929 Other chronic pain: Secondary | ICD-10-CM | POA: Diagnosis present

## 2021-07-16 NOTE — Patient Instructions (Signed)

## 2021-07-16 NOTE — Progress Notes (Signed)
Safety precautions to be maintained throughout the outpatient stay will include: orient to surroundings, keep bed in low position, maintain call bell within reach at all times, provide assistance with transfer out of bed and ambulation.  

## 2021-07-16 NOTE — Progress Notes (Signed)
PROVIDER NOTE: Information contained herein reflects review and annotations entered in association with encounter. Interpretation of such information and data should be left to medically-trained personnel. Information provided to patient can be located elsewhere in the medical record under "Patient Instructions". Document created using STT-dictation technology, any transcriptional errors that may result from process are unintentional.  ?  ?Patient: Evan Cherry  Service Category: E/M  Provider: Gillis Santa, MD  ?DOB: Jul 29, 1965  DOS: 07/16/2021  Specialty: Interventional Pain Management  ?MRN: [id number]  Setting: Ambulatory outpatient  PCP: Evan Giovanni, MD  ?Type: Established Patient    Referring Provider: Akiem Giovanni, MD  ?Location: Office  Delivery: Face-to-face    ? ?HPI  ?[name], a 56 y.o. year old male, is here today because of his Chronic pain of both knees [M25.561, M25.562, G89.29]. [name] primary complain today is Back Pain, Knee Pain (bilateral), and Neck Pain ?Last encounter: My last encounter with him was on 01/30/2021 ?Pertinent problems: [name] has Bilateral post-traumatic osteoarthritis of knee; Chronic pain of both knees; Thoracic facet joint syndrome; Thoracic spondylosis without myelopathy; and Lumbar spondylosis on their pertinent problem list. ?Pain Assessment: Severity of Chronic pain is reported as a 6 /10. Location: Back Lower/radiates down both legs to knees. Onset: More than a month ago. Quality: Shooting, Burning. Timing: Constant. Modifying factor(s): meds, ice. ?Vitals:  height is '5\' 7"'  (1.702 m) and weight is 236 lb (107 kg). His temperature is 98.6 ?F (37 ?C). His blood pressure is 138/74 and his pulse is 95. His respiration is 18 and oxygen saturation is 99%.  ? ?Reason for encounter: Increased thoracic mid back pain secondary to thoracic spondylosis and thoracic facet joint syndrome as well as increased bilateral knee pain related to bilateral knee  osteoarthritis. ? ?[name] presents today with increased thoracic mid back pain related to thoracic spondylosis.  He is status post thoracic RFA T10-11, T11-12, T12-L1, the left side being done December 05, 2020 followed by the right side on December 19, 2020.  This provided him with approximately 85% pain relief for almost 6 months with gradual return of pain thereafter.  He would like to repeat thoracic RFA. ? ?He is also experiencing increased bilateral knee pain related to knee osteoarthritis.  We discussed repeating intra-articular knee steroid injections.  This was last done on 12/12/2020. ? ?ROS  ?Constitutional: Denies any fever or chills ?Gastrointestinal: No reported hemesis, hematochezia, vomiting, or acute GI distress ?Musculoskeletal:  Midthoracic back and bilateral knee pain ?Neurological: No reported episodes of acute onset apraxia, aphasia, dysarthria, agnosia, amnesia, paralysis, loss of coordination, or loss of consciousness ? ?Medication Review  ?Benralizumab, Carboxymethylcellulose Sodium, Cyanocobalamin, EPINEPHrine, HYDROcodone-acetaminophen, Multiple Vitamin, Tiotropium Bromide Monohydrate, acetaminophen, albuterol, azelastine, benazepril, benazepril-hydrochlorthiazide, budesonide-formoterol, cholecalciferol, ciclesonide, cyanocobalamin, cyclobenzaprine, doxycycline, hydrochlorothiazide, lidocaine, loratadine, meloxicam, naproxen sodium, pravastatin, predniSONE, testosterone cypionate, tiZANidine, tiotropium, and traMADol ? ?History Review  ?Allergy: [name] has No Known Allergies. ?Drug: [name]  reports no history of drug use. ?Alcohol:  reports no history of alcohol use. ?Tobacco:  reports that he has quit smoking. His smokeless tobacco use includes snuff. ?Social: [name]  reports that he has quit smoking. His smokeless tobacco use includes snuff. He reports that he does not drink alcohol and does not use drugs. ?Medical:  has a past medical history of Asthma, Back pain, COPD (chronic  obstructive pulmonary disease) ([place]), Decreased lung capacity, Hypertension, and Sleep apnea. ?Surgical: [name]  has no past surgical history on file. ?Family:  family history is not on file. He was adopted. ? ?Laboratory Chemistry Profile  ? ?Renal ?Lab Results  ?Component Value Date  ? BUN 15 12/09/2014  ? CREATININE 0.82 12/09/2014  ? GFRAA >60 12/09/2014  ? GFRNONAA >60 12/09/2014  ?  Hepatic ?Lab Results  ?Component Value Date  ? AST 25 12/08/2014  ? ALT 34 12/08/2014  ? ALBUMIN 4.4 12/08/2014  ? ALKPHOS 72 12/08/2014  ? LIPASE 171 12/26/2011  ?  ?Electrolytes ?Lab Results  ?Component Value Date  ? NA 142 12/09/2014  ? K 4.2 12/09/2014  ? CL 107 12/09/2014  ? CALCIUM 9.2 12/09/2014  ?  Bone ?No results found for: [place], [id number], [id number], [id number], 25OHVITD1, 25OHVITD2, 25OHVITD3, TESTOFREE, TESTOSTERONE  ?Inflammation (CRP: Acute Phase) (ESR: Chronic Phase) ?No results found for: CRP, ESRSEDRATE, LATICACIDVEN    ?  ? ?Note: Above Lab results reviewed. ? ?Physical Exam  ?General appearance: Well nourished, well developed, and well hydrated. In no apparent acute distress ?Mental status: Alert, oriented x 3 (person, place, & time)       ?Respiratory: No evidence of acute respiratory distress ?Eyes: PERLA ?Vitals: BP 138/74   Pulse 95   Temp 98.6 ?F (37 ?C)   Resp 18   Ht '5\' 7"'  (1.702 m)   Wt 236 lb (107 kg)   SpO2 99%   BMI 36.96 kg/m?  ?BMI: Estimated body mass index is 36.96 kg/m? as calculated from the following: ?  Height as of this encounter: '5\' 7"'  (1.702 m). ?  Weight as of this encounter: 236 lb (107 kg). ?Ideal: Ideal body weight: 66.1 kg (145 lb 11.6 oz) ?Adjusted ideal body weight: 82.5 kg (181 lb 13.4 oz) ?  ?Midthoracic back pain most pronounced along T10-T12, pain with thoracic extension ?Bilateral knee pain worse with weightbearing, arthropathic arthralgia ?5 out of 5 strength bilateral lower extremity: Plantar flexion, dorsiflexion, knee flexion, knee extension. ? ?Assessment   ? ?Status Diagnosis  ?Having a Flare-up ?Having a Flare-up ?Having a Flare-up 1. Chronic pain of both knees   ?2. Thoracic facet joint syndrome   ?3. Thoracic spondylosis without myelopathy   ?4. Bilateral post-traumatic osteoarthritis of knee   ?5. Chronic pain syndrome   ?  ? ? ?Plan of Care  ? ? ?1. Thoracic facet joint syndrome ?- Radiofrequency,Thoracic; Future ?- Radiofrequency,Thoracic; Future ? ?2. Thoracic spondylosis without myelopathy ?- Radiofrequency,Thoracic; Future ?- Radiofrequency,Thoracic; Future ? ?3. Chronic pain of both knees ?- KNEE INJECTION; Future ? ?4. Bilateral post-traumatic osteoarthritis of knee ?- KNEE INJECTION; Future ? ?5. Chronic pain syndrome ?- Radiofrequency,Thoracic; Future ?- Radiofrequency,Thoracic; Future ?- KNEE INJECTION; Future ? ? ?Orders:  ?Orders Placed This Encounter  ?Procedures  ? Radiofrequency,Thoracic  ?  Standing Status:   Future  ?  Standing Expiration Date:   01/16/2022  ?  Scheduling Instructions:  ?   LEFT T10-11, T11-12, T12-L1  RFA  ?   PO Valium  ?   Laterality: Left-Sided Paraspinal  ?  Order Specific Question:   Where will this procedure be performed?  ?  Answer:   [facility]  ? Radiofrequency,Thoracic  ?  Standing Status:   Future  ?  Standing Expiration Date:   01/16/2022  ?  Scheduling Instructions:  ?   RIGHT  T10-11, T11-12, T12-L1  RFA  ?   PO Valium  ?   Laterality: Left-Sided Paraspinal  ?   2 weeks after left  ?  Order Specific Question:   Where will this procedure be  performed?  ?  Answer:   [facility]  ? KNEE INJECTION  ?  Local Anesthetic & Steroid injection.  ?  Standing Status:   Future  ?  Standing Expiration Date:   10/16/2021  ?  Scheduling Instructions:  ?   Side: Bilateral  ?   Sedation: None  ?   Timeframe: As soon as schedule allows  ?  Order Specific Question:   Where will this procedure be performed?  ?  Answer:   [facility]  ? ?Follow-up plan:   ?Return in about 2 weeks (around 07/30/2021) for Left  T10-11, T11-12, T12-L1 + B/L Knee steroid (block 1 hour), in clinic (PO Valium).   ?  ?B/L IA Knee steroid 12/12/19: Helpful,  T10, 11, 12 MBB on 12/19/2019 followed by RFA (previosly done with Dr Maryjean Ka in 2018), status post bilateral T

## 2021-07-29 ENCOUNTER — Ambulatory Visit
Admission: RE | Admit: 2021-07-29 | Discharge: 2021-07-29 | Disposition: A | Discharge status: Home / Self Care | Payer: No Typology Code available for payment source | Source: Ambulatory Visit | Attending: Student in an Organized Health Care Education/Training Program | Admitting: Student in an Organized Health Care Education/Training Program

## 2021-07-29 ENCOUNTER — Encounter: Payer: Self-pay | Admitting: Student in an Organized Health Care Education/Training Program

## 2021-07-29 ENCOUNTER — Ambulatory Visit (HOSPITAL_BASED_OUTPATIENT_CLINIC_OR_DEPARTMENT_OTHER)
Payer: No Typology Code available for payment source | Admitting: Student in an Organized Health Care Education/Training Program

## 2021-07-29 VITALS — BP 136/101 | HR 79 | Temp 97.7°F | Resp 18 | Ht 67.0 in | Wt 236.0 lb

## 2021-07-29 DIAGNOSIS — G894 Chronic pain syndrome: Secondary | ICD-10-CM | POA: Diagnosis not present

## 2021-07-29 DIAGNOSIS — M47814 Spondylosis without myelopathy or radiculopathy, thoracic region: Secondary | ICD-10-CM | POA: Insufficient documentation

## 2021-07-29 DIAGNOSIS — M172 Bilateral post-traumatic osteoarthritis of knee: Secondary | ICD-10-CM | POA: Diagnosis not present

## 2021-07-29 DIAGNOSIS — G8929 Other chronic pain: Secondary | ICD-10-CM

## 2021-07-29 DIAGNOSIS — M47894 Other spondylosis, thoracic region: Secondary | ICD-10-CM | POA: Insufficient documentation

## 2021-07-29 DIAGNOSIS — M25562 Pain in left knee: Secondary | ICD-10-CM

## 2021-07-29 DIAGNOSIS — M25561 Pain in right knee: Secondary | ICD-10-CM

## 2021-07-29 MED ORDER — LIDOCAINE HCL 2 % IJ SOLN
INTRAMUSCULAR | Status: AC
  Filled 2021-07-29: qty 20

## 2021-07-29 MED ORDER — ROPIVACAINE HCL 2 MG/ML IJ SOLN
9.0000 mL | Freq: Once | INTRAMUSCULAR | Status: AC
  Administered 2021-07-29: 9 mL
  Filled 2021-07-29: qty 20

## 2021-07-29 MED ORDER — DEXAMETHASONE SODIUM PHOSPHATE 10 MG/ML IJ SOLN
10.0000 mg | Freq: Once | INTRAMUSCULAR | Status: AC
  Administered 2021-07-29: 10 mg
  Filled 2021-07-29: qty 1

## 2021-07-29 MED ORDER — ROPIVACAINE HCL 2 MG/ML IJ SOLN
9.0000 mL | Freq: Once | INTRAMUSCULAR | Status: AC
  Administered 2021-07-29: 9 mL

## 2021-07-29 MED ORDER — ROPIVACAINE HCL 2 MG/ML IJ SOLN
INTRAMUSCULAR | Status: AC
  Filled 2021-07-29: qty 20

## 2021-07-29 MED ORDER — METHYLPREDNISOLONE ACETATE 80 MG/ML IJ SUSP
INTRAMUSCULAR | Status: AC
  Filled 2021-07-29: qty 2

## 2021-07-29 MED ORDER — LIDOCAINE HCL 2 % IJ SOLN
20.0000 mL | Freq: Once | INTRAMUSCULAR | Status: AC
  Administered 2021-07-29: 400 mg
  Filled 2021-07-29: qty 20

## 2021-07-29 MED ORDER — DIAZEPAM 5 MG PO TABS
ORAL_TABLET | ORAL | Status: AC
  Filled 2021-07-29: qty 1

## 2021-07-29 MED ORDER — METHYLPREDNISOLONE ACETATE 80 MG/ML IJ SUSP
80.0000 mg | Freq: Once | INTRAMUSCULAR | Status: AC
  Administered 2021-07-29: 80 mg via INTRA_ARTICULAR

## 2021-07-29 MED ORDER — DEXAMETHASONE SODIUM PHOSPHATE 10 MG/ML IJ SOLN
10.0000 mg | Freq: Once | INTRAMUSCULAR | Status: DC
  Filled 2021-07-29: qty 1

## 2021-07-29 NOTE — Progress Notes (Signed)
PROVIDER NOTE: Information contained herein reflects review and annotations entered in association with encounter. Interpretation of such information and data should be left to medically-trained personnel. Information provided to patient can be located elsewhere in the medical record under "Patient Instructions". Document created using STT-dictation technology, any transcriptional errors that may result from process are unintentional.  ?  ?Patient: Evan Cherry  Service Category: Procedure  Provider: Gillis Santa, MD  ?DOB: 12-Sep-1965  DOS: 07/29/2021  Location: [facility]  ?MRN: [id number]  Setting: Ambulatory - outpatient  Referring Provider: Laakea Giovanni, MD  ?Type: Established Patient  Specialty: Interventional Pain Management  PCP: Juel Giovanni, MD  ? ?Primary Reason for Visit: Interventional Pain Management Treatment. ?CC: Back Pain (lower) ? ? ?Procedure:          Anesthesia, Analgesia, Anxiolysis:  ?Type: Therapeutic Thoracic Medial Nerve Radiofrequency Ablation  #3 (#2 done 12/05/20) ?Region: Posterior   ?Level: T10-11, T11-12, T12-L1 and   Medial Branch Level(s) ?Laterality: Left-Sided Paraspinal  Type:  Minimal sedation with p.o. Valium ?Indication(s): Analgesia and Anxiety ?Route:  PO ? ?Sedation: Meaningful verbal contact was maintained at all times during the procedure  ?Local Anesthetic: Lidocaine 1-2% ? ?Position: Prone  ? ?Indications: ?1. Thoracic facet joint syndrome   ?2. Thoracic spondylosis without myelopathy   ?3. Chronic pain of both knees   ?4. Bilateral post-traumatic osteoarthritis of knee   ?5. Chronic pain syndrome   ? ?Mr. Frerking has been dealing with the above chronic pain for longer than three months and has either failed to respond, was unable to tolerate, or simply did not get enough benefit from other more conservative therapies including, but not limited to: ?1. Over-the-counter medications ?2. Anti-inflammatory medications ?3. Muscle relaxants ?4.  Membrane stabilizers ?5. Opioids ?6. Physical therapy and/or chiropractic manipulation ?7. Modalities (Heat, ice, etc.) ?8. Invasive techniques such as nerve blocks. ?Mr. Ganus has attained more than 50% relief of the pain from a series of diagnostic injections conducted in separate occasions. ? ?Pain Score: ?Pre-procedure: 5 /10 ?Post-procedure: 5 /10 ? ?Pre-op Assessment:  ?Mr. Wanzer is a 56 y.o. (year old), male patient, seen today for interventional treatment. He  has no past surgical history on file. Mr. Schaum has a current medication list which includes the following prescription(s): acetaminophen, albuterol, albuterol, azelastine, benazepril, benazepril-hydrochlorthiazide, fasenra, budesonide-formoterol, carboxymethylcellulose sodium, cholecalciferol, ciclesonide, cyanocobalamin, cyanocobalamin, cyclobenzaprine, doxycycline, epinephrine, hydrochlorothiazide, hydrocodone-acetaminophen, lidocaine, loratadine, multiple vitamin, naproxen sodium, testosterone cypionate, tiotropium, tiotropium bromide monohydrate, tramadol, hydrocodone-acetaminophen, meloxicam, pravastatin, prednisone, and tizanidine. His primarily concern today is the Back Pain (lower) ? ? ?Initial Vital Signs:  ?Pulse/HCG Rate: 79ECG Heart Rate: 79 ?Temp: 97.7 ?F (36.5 ?C) ?Resp: 16 ?BP: 108/60 ?SpO2: 99 % ? ?BMI: Estimated body mass index is 36.96 kg/m? as calculated from the following: ?  Height as of this encounter: '5\' 7"'$  (1.702 m). ?  Weight as of this encounter: 236 lb (107 kg). ? ?Risk Assessment: ?Allergies: Reviewed. He has No Known Allergies.  ?Allergy Precautions: None required ?Coagulopathies: Reviewed. None identified.  ?Blood-thinner therapy: None at this time ?Active Infection(s): Reviewed. None identified. Mr. Dillenbeck is afebrile ? ?Site Confirmation: Mr. Ederer was asked to confirm the procedure and laterality before marking the site ?Procedure checklist: Completed ?Consent: Before the procedure and under the influence of no sedative(s),  amnesic(s), or anxiolytics, the patient was informed of the treatment options, risks and possible complications. To fulfill our ethical and legal obligations, as recommended by the American Medical Association's Code of Ethics,  I have informed the patient of my clinical impression; the nature and purpose of the treatment or procedure; the risks, benefits, and possible complications of the intervention; the alternatives, including doing nothing; the risk(s) and benefit(s) of the alternative treatment(s) or procedure(s); and the risk(s) and benefit(s) of doing nothing. ?The patient was provided information about the general risks and possible complications associated with the procedure. These may include, but are not limited to: failure to achieve desired goals, infection, bleeding, organ or nerve damage, allergic reactions, paralysis, and death. ?In addition, the patient was informed of those risks and complications associated to Spine-related procedures, such as failure to decrease pain; infection (i.e.: Meningitis, epidural or intraspinal abscess); bleeding (i.e.: epidural hematoma, subarachnoid hemorrhage, or any other type of intraspinal or peri-dural bleeding); organ or nerve damage (i.e.: Any type of peripheral nerve, nerve root, or spinal cord injury) with subsequent damage to sensory, motor, and/or autonomic systems, resulting in permanent pain, numbness, and/or weakness of one or several areas of the body; allergic reactions; (i.e.: anaphylactic reaction); and/or death. ?Furthermore, the patient was informed of those risks and complications associated with the medications. These include, but are not limited to: allergic reactions (i.e.: anaphylactic or anaphylactoid reaction(s)); adrenal axis suppression; blood sugar elevation that in diabetics may result in ketoacidosis or comma; water retention that in patients with history of congestive heart failure may result in shortness of breath, pulmonary edema, and  decompensation with resultant heart failure; weight gain; swelling or edema; medication-induced neural toxicity; particulate matter embolism and blood vessel occlusion with resultant organ, and/or nervous system infarction; and/or aseptic necrosis of one or more joints. ?Finally, the patient was informed that Medicine is not an exact science; therefore, there is also the possibility of unforeseen or unpredictable risks and/or possible complications that may result in a catastrophic outcome. The patient indicated having understood very clearly. We have given the patient no guarantees and we have made no promises. Enough time was given to the patient to ask questions, all of which were answered to the patient's satisfaction. Mr. Heyward has indicated that he wanted to continue with the procedure. ?Attestation: I, the ordering provider, attest that I have discussed with the patient the benefits, risks, side-effects, alternatives, likelihood of achieving goals, and potential problems during recovery for the procedure that I have provided informed consent. ?Date  Time: 07/29/2021  9:35 AM ? ?Pre-Procedure Preparation:  ?Monitoring: As per clinic protocol. Respiration, ETCO2, SpO2, BP, heart rate and rhythm monitor placed and checked for adequate function ?Safety Precautions: Patient was assessed for positional comfort and pressure points before starting the procedure. ?Time-out: I initiated and conducted the "Time-out" before starting the procedure, as per protocol. The patient was asked to participate by confirming the accuracy of the "Time Out" information. Verification of the correct person, site, and procedure were performed and confirmed by me, the nursing staff, and the patient. "Time-out" conducted as per Joint Commission's Universal Protocol (UP.01.01.01). ?Time: 1037 ? ?Description of Procedure:          ?Target Area: For thoracicFacet block(s), the target is the groove formed by the junction of the transverse  process and superior articular process.  ?Approach: Paraspinal approach. ?Area Prepped: Entire Upper Back Area ?DuraPrep (Iodine Povacrylex [0.7% available iodine] and Isopropyl Alcohol, 74% w/w) ?Safety Precauti

## 2021-07-29 NOTE — Patient Instructions (Signed)

## 2021-07-29 NOTE — Progress Notes (Signed)
Safety precautions to be maintained throughout the outpatient stay will include: orient to surroundings, keep bed in low position, maintain call bell within reach at all times, provide assistance with transfer out of bed and ambulation.  

## 2021-07-29 NOTE — Progress Notes (Signed)
PROVIDER NOTE: Information contained herein reflects review and annotations entered in association with encounter. Interpretation of such information and data should be left to medically-trained personnel. Information provided to patient can be located elsewhere in the medical record under "Patient Instructions". Document created using STT-dictation technology, any transcriptional errors that may result from process are unintentional.  ?  ?Patient: Evan Cherry  Service Category: Procedure  Provider: Gillis Santa, MD  ?DOB: 19-Jul-1965  DOS: 07/29/2021  Location: [facility]  ?MRN: [id number]  Setting: Ambulatory - outpatient  Referring Provider: Zaydon Giovanni, MD  ?Type: Established Patient  Specialty: Interventional Pain Management  PCP: Evan Giovanni, MD  ? ?Primary Reason for Visit: Interventional Pain Management Treatment. ?CC: Back Pain (lower) ? ? ?Procedure:          Anesthesia, Analgesia, Anxiolysis:  ?Type: Therapeutic Intra-Articular Local anesthetic and steroid Knee Injection #3  ?Region: Medial infrapatellar Knee Region ?Level: Knee Joint ?Laterality: Bilateral  Type: Local Anesthesia + 5 mg PO Valium ? ?Local Anesthetic: Lidocaine 1-2% ?Route: Infiltration ([place]/IM) ?IV Access: Declined ?Sedation: Declined  ? ?Position: Sitting  ? ?Indications: ?Bilateral Knee OA ? ?Pain Score: ?Pre-procedure: 5 /10 ?Post-procedure: 3/10  ? ?Pre-op Assessment:  ?Evan Cherry is a 56 y.o. (year old), male patient, seen today for interventional treatment. He  has no past surgical history on file. Evan Cherry has a current medication list which includes the following prescription(s): acetaminophen, albuterol, albuterol, azelastine, benazepril, benazepril-hydrochlorthiazide, fasenra, budesonide-formoterol, carboxymethylcellulose sodium, cholecalciferol, ciclesonide, cyanocobalamin, cyanocobalamin, cyclobenzaprine, doxycycline, epinephrine, hydrochlorothiazide, hydrocodone-acetaminophen, lidocaine,  loratadine, multiple vitamin, naproxen sodium, testosterone cypionate, tiotropium, tiotropium bromide monohydrate, tramadol, hydrocodone-acetaminophen, meloxicam, pravastatin, prednisone, and tizanidine, and the following Facility-Administered Medications: dexamethasone. His primarily concern today is the Back Pain (lower) ? ? ?Initial Vital Signs:  ?Pulse/HCG Rate: 79ECG Heart Rate: 79 ?Temp: 97.7 ?F (36.5 ?C) ?Resp: 16 ?BP: 108/60 ?SpO2: 99 % ? ?BMI: Estimated body mass index is 36.96 kg/m? as calculated from the following: ?  Height as of this encounter: '5\' 7"'$  (1.702 m). ?  Weight as of this encounter: 236 lb (107 kg). ? ?Risk Assessment: ?Allergies: Reviewed. He has No Known Allergies.  ?Allergy Precautions: None required ?Coagulopathies: Reviewed. None identified.  ?Blood-thinner therapy: None at this time ?Active Infection(s): Reviewed. None identified. Evan Cherry is afebrile ? ?Site Confirmation: Evan Cherry was asked to confirm the procedure and laterality before marking the site ?Procedure checklist: Completed ?Consent: Before the procedure and under the influence of no sedative(s), amnesic(s), or anxiolytics, the patient was informed of the treatment options, risks and possible complications. To fulfill our ethical and legal obligations, as recommended by the American Medical Association's Code of Ethics, I have informed the patient of my clinical impression; the nature and purpose of the treatment or procedure; the risks, benefits, and possible complications of the intervention; the alternatives, including doing nothing; the risk(s) and benefit(s) of the alternative treatment(s) or procedure(s); and the risk(s) and benefit(s) of doing nothing. ?The patient was provided information about the general risks and possible complications associated with the procedure. These may include, but are not limited to: failure to achieve desired goals, infection, bleeding, organ or nerve damage, allergic reactions, paralysis,  and death. ?In addition, the patient was informed of those risks and complications associated to the procedure, such as failure to decrease pain; infection; bleeding; organ or nerve damage with subsequent damage to sensory, motor, and/or autonomic systems, resulting in permanent pain, numbness, and/or weakness of one or several areas of the body;  allergic reactions; (i.e.: anaphylactic reaction); and/or death. ?Furthermore, the patient was informed of those risks and complications associated with the medications. These include, but are not limited to: allergic reactions (i.e.: anaphylactic or anaphylactoid reaction(s)); adrenal axis suppression; blood sugar elevation that in diabetics may result in ketoacidosis or comma; water retention that in patients with history of congestive heart failure may result in shortness of breath, pulmonary edema, and decompensation with resultant heart failure; weight gain; swelling or edema; medication-induced neural toxicity; particulate matter embolism and blood vessel occlusion with resultant organ, and/or nervous system infarction; and/or aseptic necrosis of one or more joints. ?Finally, the patient was informed that Medicine is not an exact science; therefore, there is also the possibility of unforeseen or unpredictable risks and/or possible complications that may result in a catastrophic outcome. The patient indicated having understood very clearly. We have given the patient no guarantees and we have made no promises. Enough time was given to the patient to ask questions, all of which were answered to the patient's satisfaction. Evan Cherry has indicated that he wanted to continue with the procedure. ?Attestation: I, the ordering provider, attest that I have discussed with the patient the benefits, risks, side-effects, alternatives, likelihood of achieving goals, and potential problems during recovery for the procedure that I have provided informed consent. ?Date  Time: 07/29/2021   9:35 AM ? ?Pre-Procedure Preparation:  ?Monitoring: As per clinic protocol. Respiration, ETCO2, SpO2, BP, heart rate and rhythm monitor placed and checked for adequate function ?Safety Precautions: Patient was assessed for positional comfort and pressure points before starting the procedure. ?Time-out: I initiated and conducted the "Time-out" before starting the procedure, as per protocol. The patient was asked to participate by confirming the accuracy of the "Time Out" information. Verification of the correct person, site, and procedure were performed and confirmed by me, the nursing staff, and the patient. "Time-out" conducted as per Joint Commission's Universal Protocol (UP.01.01.01). ?Time: 1037 ? ?Description of Procedure:          ?Target Area: Knee Joint ?Approach: Just above the Medial tibial plateau, lateral to the infrapatellar tendon. ?Area Prepped: Entire knee area, from the mid-thigh to the mid-shin. ?DuraPrep (Iodine Povacrylex [0.7% available iodine] and Isopropyl Alcohol, 74% w/w) ?Safety Precautions: Aspiration looking for blood return was conducted prior to all injections. At no point did we inject any substances, as a needle was being advanced. No attempts were made at seeking any paresthesias. Safe injection practices and needle disposal techniques used. Medications properly checked for expiration dates. SDV (single dose vial) medications used. ?Description of the Procedure: Protocol guidelines were followed. The patient was placed in position over the fluoroscopy table. The target area was identified and the area prepped in the usual manner. Skin & deeper tissues infiltrated with local anesthetic. Appropriate amount of time allowed to pass for local anesthetics to take effect. The procedure needles were then advanced to the target area. Proper needle placement secured. Negative aspiration confirmed. Solution injected in intermittent fashion, asking for systemic symptoms every 0.5cc of injectate.  The needles were then removed and the area cleansed, making sure to leave some of the prepping solution back to take advantage of its long term bactericidal properties. ?Vitals:  ? 07/29/21 1040 07/29/21 1045

## 2021-07-30 ENCOUNTER — Telehealth: Payer: Self-pay

## 2021-07-30 NOTE — Telephone Encounter (Signed)
Post procedure phone call.  LM 

## 2021-08-12 ENCOUNTER — Ambulatory Visit
Admission: RE | Admit: 2021-08-12 | Discharge: 2021-08-12 | Disposition: A | Discharge status: Home / Self Care | Payer: No Typology Code available for payment source | Source: Ambulatory Visit | Attending: Student in an Organized Health Care Education/Training Program | Admitting: Student in an Organized Health Care Education/Training Program

## 2021-08-12 ENCOUNTER — Encounter: Payer: Self-pay | Admitting: Student in an Organized Health Care Education/Training Program

## 2021-08-12 ENCOUNTER — Ambulatory Visit (HOSPITAL_BASED_OUTPATIENT_CLINIC_OR_DEPARTMENT_OTHER)
Payer: No Typology Code available for payment source | Admitting: Student in an Organized Health Care Education/Training Program

## 2021-08-12 VITALS — BP 128/78 | HR 75 | Temp 97.2°F | Resp 17 | Ht 68.0 in | Wt 236.0 lb

## 2021-08-12 DIAGNOSIS — M47814 Spondylosis without myelopathy or radiculopathy, thoracic region: Secondary | ICD-10-CM

## 2021-08-12 DIAGNOSIS — G894 Chronic pain syndrome: Secondary | ICD-10-CM | POA: Insufficient documentation

## 2021-08-12 DIAGNOSIS — M47894 Other spondylosis, thoracic region: Secondary | ICD-10-CM | POA: Diagnosis present

## 2021-08-12 MED ORDER — LIDOCAINE HCL 2 % IJ SOLN
20.0000 mL | Freq: Once | INTRAMUSCULAR | Status: AC
  Administered 2021-08-12: 400 mg
  Filled 2021-08-12: qty 40

## 2021-08-12 MED ORDER — DIAZEPAM 5 MG PO TABS
ORAL_TABLET | ORAL | Status: AC
  Filled 2021-08-12: qty 1

## 2021-08-12 MED ORDER — DEXAMETHASONE SODIUM PHOSPHATE 10 MG/ML IJ SOLN
10.0000 mg | Freq: Once | INTRAMUSCULAR | Status: AC
  Administered 2021-08-12: 10 mg
  Filled 2021-08-12: qty 1

## 2021-08-12 MED ORDER — ROPIVACAINE HCL 2 MG/ML IJ SOLN
9.0000 mL | Freq: Once | INTRAMUSCULAR | Status: AC
  Administered 2021-08-12: 9 mL via PERINEURAL
  Filled 2021-08-12: qty 20

## 2021-08-12 MED ORDER — MIDAZOLAM HCL 5 MG/5ML IJ SOLN
0.5000 mg | Freq: Once | INTRAMUSCULAR | Status: AC
  Administered 2021-08-12: 2 mg via INTRAVENOUS
  Filled 2021-08-12: qty 5

## 2021-08-12 NOTE — Patient Instructions (Signed)

## 2021-08-12 NOTE — Progress Notes (Signed)
PROVIDER NOTE: Information contained herein reflects review and annotations entered in association with encounter. Interpretation of such information and data should be left to medically-trained personnel. Information provided to patient can be located elsewhere in the medical record under "Patient Instructions". Document created using STT-dictation technology, any transcriptional errors that may result from process are unintentional.  ?  ?Patient: Evan Cherry  Service Category: Procedure  Provider: Gillis Santa, MD  ?DOB: [birth year]  DOS: 08/12/2021  Location: [facility]  ?MRN: [id number]  Setting: Ambulatory - outpatient  Referring Provider: Sanad Giovanni, MD  ?Type: Established Patient  Specialty: Interventional Pain Management  PCP: Shafter Giovanni, MD  ? ?Primary Reason for Visit: Interventional Pain Management Treatment. ?CC: Back Pain (lower) ? ? ?Procedure:          Anesthesia, Analgesia, Anxiolysis:  ?Type: Therapeutic Thoracic Medial Nerve Radiofrequency Ablation  #3 (#2 done 12/19/20)  ?Region: Posterior   ?Level: T10-11, T11-12, and T12-L1 Medial Branch Level(s) ?Laterality: Right-Sided Paraspinal  Type:  Minimal sedation with IV versed 2 mg x1 ?Indication(s): Analgesia and Anxiety ? ? ?Sedation: Meaningful verbal contact was maintained at all times during the procedure  ?Local Anesthetic: Lidocaine 1-2% ? ?Position: Prone  ? ?Indications: ?1. Thoracic facet joint syndrome   ?2. Thoracic spondylosis without myelopathy   ?3. Chronic pain syndrome   ? ? ? ?Mr. Maden has been dealing with the above chronic pain for longer than three months and has either failed to respond, was unable to tolerate, or simply did not get enough benefit from other more conservative therapies including, but not limited to: ?1. Over-the-counter medications ?2. Anti-inflammatory medications ?3. Muscle relaxants ?4. Membrane stabilizers ?5. Opioids ?6. Physical therapy and/or chiropractic manipulation ?7.  Modalities (Heat, ice, etc.) ?8. Invasive techniques such as nerve blocks. ?Mr. Habibi has attained more than 50% relief of the pain from a series of diagnostic injections conducted in separate occasions. ? ?Pain Score: ?Pre-procedure: 5 /10 ?Post-procedure: 3  (moving)/10 ? ?Pre-op Assessment:  ?Mr. Jungwirth is a 56 y.o. (year old), male patient, seen today for interventional treatment. He  has no past surgical history on file. Mr. Oesterle has a current medication list which includes the following prescription(s): acetaminophen, albuterol, albuterol, azelastine, benazepril, benazepril-hydrochlorthiazide, fasenra, budesonide-formoterol, carboxymethylcellulose sodium, cholecalciferol, ciclesonide, cyanocobalamin, cyanocobalamin, cyclobenzaprine, epinephrine, hydrochlorothiazide, hydrocodone-acetaminophen, lidocaine, loratadine, multiple vitamin, naproxen sodium, testosterone cypionate, tiotropium, tiotropium bromide monohydrate, tramadol, doxycycline, hydrocodone-acetaminophen, meloxicam, pravastatin, prednisone, and tizanidine. His primarily concern today is the Back Pain (lower) ? ? ?Initial Vital Signs:  ?Pulse/HCG Rate: 75ECG Heart Rate: 82 ?Temp: (!) 97.2 ?F (36.2 ?C) ?Resp: 18 ?BP: 134/79 ?SpO2: 100 % ? ?BMI: Estimated body mass index is 35.88 kg/m? as calculated from the following: ?  Height as of this encounter: '5\' 8"'$  (1.727 m). ?  Weight as of this encounter: 236 lb (107 kg). ? ?Risk Assessment: ?Allergies: Reviewed. He has No Known Allergies.  ?Allergy Precautions: None required ?Coagulopathies: Reviewed. None identified.  ?Blood-thinner therapy: None at this time ?Active Infection(s): Reviewed. None identified. Mr. Tadesse is afebrile ? ?Site Confirmation: Mr. Nazir was asked to confirm the procedure and laterality before marking the site ?Procedure checklist: Completed ?Consent: Before the procedure and under the influence of no sedative(s), amnesic(s), or anxiolytics, the patient was informed of the treatment  options, risks and possible complications. To fulfill our ethical and legal obligations, as recommended by the American Medical Association's Code of Ethics, I have informed the patient of my clinical impression; the nature and  purpose of the treatment or procedure; the risks, benefits, and possible complications of the intervention; the alternatives, including doing nothing; the risk(s) and benefit(s) of the alternative treatment(s) or procedure(s); and the risk(s) and benefit(s) of doing nothing. ?The patient was provided information about the general risks and possible complications associated with the procedure. These may include, but are not limited to: failure to achieve desired goals, infection, bleeding, organ or nerve damage, allergic reactions, paralysis, and death. ?In addition, the patient was informed of those risks and complications associated to Spine-related procedures, such as failure to decrease pain; infection (i.e.: Meningitis, epidural or intraspinal abscess); bleeding (i.e.: epidural hematoma, subarachnoid hemorrhage, or any other type of intraspinal or peri-dural bleeding); organ or nerve damage (i.e.: Any type of peripheral nerve, nerve root, or spinal cord injury) with subsequent damage to sensory, motor, and/or autonomic systems, resulting in permanent pain, numbness, and/or weakness of one or several areas of the body; allergic reactions; (i.e.: anaphylactic reaction); and/or death. ?Furthermore, the patient was informed of those risks and complications associated with the medications. These include, but are not limited to: allergic reactions (i.e.: anaphylactic or anaphylactoid reaction(s)); adrenal axis suppression; blood sugar elevation that in diabetics may result in ketoacidosis or comma; water retention that in patients with history of congestive heart failure may result in shortness of breath, pulmonary edema, and decompensation with resultant heart failure; weight gain; swelling or  edema; medication-induced neural toxicity; particulate matter embolism and blood vessel occlusion with resultant organ, and/or nervous system infarction; and/or aseptic necrosis of one or more joints. ?Finally, the patient was informed that Medicine is not an exact science; therefore, there is also the possibility of unforeseen or unpredictable risks and/or possible complications that may result in a catastrophic outcome. The patient indicated having understood very clearly. We have given the patient no guarantees and we have made no promises. Enough time was given to the patient to ask questions, all of which were answered to the patient's satisfaction. Mr. Lamping has indicated that he wanted to continue with the procedure. ?Attestation: I, the ordering provider, attest that I have discussed with the patient the benefits, risks, side-effects, alternatives, likelihood of achieving goals, and potential problems during recovery for the procedure that I have provided informed consent. ?Date  Time: 08/12/2021  9:44 AM ? ?Pre-Procedure Preparation:  ?Monitoring: As per clinic protocol. Respiration, ETCO2, SpO2, BP, heart rate and rhythm monitor placed and checked for adequate function ?Safety Precautions: Patient was assessed for positional comfort and pressure points before starting the procedure. ?Time-out: I initiated and conducted the "Time-out" before starting the procedure, as per protocol. The patient was asked to participate by confirming the accuracy of the "Time Out" information. Verification of the correct person, site, and procedure were performed and confirmed by me, the nursing staff, and the patient. "Time-out" conducted as per Joint Commission's Universal Protocol (UP.01.01.01). ?Time: 9983 ? ?Description of Procedure:          ?Target Area: For thoracic Facet block(s), the target is the groove formed by the junction of the transverse process and superior articular process.  ?Approach: Paraspinal  approach. ?Area Prepped: Entire Upper Back Area ?DuraPrep (Iodine Povacrylex [0.7% available iodine] and Isopropyl Alcohol, 74% w/w) ?Safety Precautions: Aspiration looking for blood return was conducted prior to all in

## 2021-08-12 NOTE — Progress Notes (Signed)
Safety precautions to be maintained throughout the outpatient stay will include: orient to surroundings, keep bed in low position, maintain call bell within reach at all times, provide assistance with transfer out of bed and ambulation.  

## 2021-08-13 ENCOUNTER — Telehealth: Payer: Self-pay | Admitting: *Deleted

## 2021-08-13 NOTE — Telephone Encounter (Signed)
No problems post procedure. 

## 2021-09-16 ENCOUNTER — Encounter: Payer: Self-pay | Admitting: Student in an Organized Health Care Education/Training Program

## 2021-09-16 ENCOUNTER — Ambulatory Visit
Payer: No Typology Code available for payment source | Attending: Student in an Organized Health Care Education/Training Program | Admitting: Student in an Organized Health Care Education/Training Program

## 2021-09-16 DIAGNOSIS — M47816 Spondylosis without myelopathy or radiculopathy, lumbar region: Secondary | ICD-10-CM | POA: Diagnosis not present

## 2021-09-16 DIAGNOSIS — M47894 Other spondylosis, thoracic region: Secondary | ICD-10-CM

## 2021-09-16 DIAGNOSIS — M47814 Spondylosis without myelopathy or radiculopathy, thoracic region: Secondary | ICD-10-CM | POA: Diagnosis not present

## 2021-09-16 DIAGNOSIS — M5134 Other intervertebral disc degeneration, thoracic region: Secondary | ICD-10-CM

## 2021-09-16 DIAGNOSIS — G894 Chronic pain syndrome: Secondary | ICD-10-CM

## 2021-09-16 NOTE — Progress Notes (Signed)
Patient: Evan Cherry  Service Category: E/M  Provider: Gillis Santa, MD  DOB: February 02, 1966  DOS: 09/16/2021  Location: Office  MRN: [id number]  Setting: Ambulatory outpatient  Referring Provider: Giavanni Giovanni, MD  Type: Established Patient  Specialty: Interventional Pain Management  PCP: Gavon Giovanni, MD  Location: Remote location  Delivery: TeleHealth     Virtual Encounter - Pain Management PROVIDER NOTE: Information contained herein reflects review and annotations entered in association with encounter. Interpretation of such information and data should be left to medically-trained personnel. Information provided to patient can be located elsewhere in the medical record under "Patient Instructions". Document created using STT-dictation technology, any transcriptional errors that may result from process are unintentional.    Contact & Pharmacy Preferred: [phone] Home: [phone] (home) Mobile: [phone] (mobile) E-mail: [email or web address]  [id number] - [place] [address], [place] AT [place] [address] [place] [address] Phone: [phone] Fax: [phone]   Pre-screening  Evan Cherry offered "in-person" vs "virtual" encounter. He indicated preferring virtual for this encounter.   Reason COVID-19*  Social distancing based on CDC and AMA recommendations.   I contacted Evan Cherry on 09/16/2021 via telephone.      I clearly identified myself as Gillis Santa, MD. I verified that I was speaking with the correct person using two identifiers (Name: Evan Cherry, and date of birth: Evan Cherry).  Consent I sought verbal advanced consent from Evan Cherry for virtual visit interactions. I informed Evan Cherry of possible security and privacy concerns, risks, and limitations associated with providing "not-in-person" medical evaluation and management services. I also informed Evan Cherry of the availability of  "in-person" appointments. Finally, I informed him that there would be a charge for the virtual visit and that he could be  personally, fully or partially, financially responsible for it. Evan Cherry expressed understanding and agreed to proceed.   Historic Elements   Evan Cherry is a 56 y.o. year old, male patient evaluated today after our last contact on 08/12/2021. Evan Cherry  has a past medical history of Asthma, Back pain, COPD (chronic obstructive pulmonary disease) ([facility]), Decreased lung capacity, Hypertension, and Sleep apnea. He also  has no past surgical history on file. Evan Cherry has a current medication list which includes the following prescription(s): acetaminophen, albuterol, albuterol, azelastine, benazepril, benazepril-hydrochlorthiazide, fasenra, budesonide-formoterol, cholecalciferol, ciclesonide, cyanocobalamin, cyanocobalamin, cyclobenzaprine, epinephrine, hydrochlorothiazide, lidocaine, loratadine, multiple vitamin, naproxen sodium, testosterone cypionate, tiotropium, tiotropium bromide monohydrate, tramadol, carboxymethylcellulose sodium, doxycycline, hydrocodone-acetaminophen, hydrocodone-acetaminophen, meloxicam, pravastatin, prednisone, and tizanidine. He  reports that he has quit smoking. His smokeless tobacco use includes snuff. He reports that he does not drink alcohol and does not use drugs. Evan Cherry has No Known Allergies.   HPI  Today, he is being contacted for a post-procedure assessment.   Post-procedure evaluation  Right T10, T11, T12 RFA 08/12/2021; left T10, T11, T12 RFA 07/29/2021    Effectiveness:  Initial hour after procedure: 75 %  Subsequent 4-6 hours post-procedure: 75 %  Analgesia past initial 6 hours: 50 %  Ongoing improvement:  Analgesic:  50% Function: Evan Cherry reports improvement in function ROM: Evan Cherry reports improvement in ROM   Laboratory Chemistry Profile   Renal Lab Results  Component Value Date   BUN 15 12/09/2014   CREATININE 0.82  12/09/2014   GFRAA >60 12/09/2014   GFRNONAA >60 12/09/2014    Hepatic Lab Results  Component Value Date  AST 25 12/08/2014   ALT 34 12/08/2014   ALBUMIN 4.4 12/08/2014   ALKPHOS 72 12/08/2014   LIPASE 171 12/26/2011    Electrolytes Lab Results  Component Value Date   NA 142 12/09/2014   K 4.2 12/09/2014   CL 107 12/09/2014   CALCIUM 9.2 12/09/2014    Bone No results found for: VD25OH, VD125OH2TOT, IF0277AJ2, IN8676HM0, 25OHVITD1, 25OHVITD2, 25OHVITD3, TESTOFREE, TESTOSTERONE  Inflammation (CRP: Acute Phase) (ESR: Chronic Phase) No results found for: CRP, ESRSEDRATE, LATICACIDVEN       Note: Above Lab results reviewed.   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 Diagnoses of Thoracic spondylosis without myelopathy, Lumbar spondylosis, Lumbar facet arthropathy, Chronic pain syndrome, and Other intervertebral disc degeneration, thoracic region were also pertinent to this visit.  Plan of Care  Unfortunately, no significant or long-term benefit with his previous thoracic radiofrequency ablation.  His last couple of ablations have had less than desirable results with less than expected decrease in his pain after procedure.  Patient is also endorsing lower back pain that is worse with lumbar extension.  At this time, I recommend that we pursue additional work-up by updating thoracic and lumbar MRI.  I will review these results with the patient and discuss updated treatment plan with him afterwards.    Orders:  Orders Placed This Encounter  Procedures   MR LUMBAR SPINE WO CONTRAST    Patient presents with axial pain with possible radicular component. Please assist [place] in identifying specific level(s) and laterality of any additional findings such as: 1. Facet (Zygapophyseal) joint DJD (Hypertrophy, space narrowing, subchondral sclerosis, and/or osteophyte formation) 2. DDD and/or IVDD (Loss of disc height, desiccation, gas patterns, osteophytes,  endplate sclerosis, or "Black disc disease") 3. Pars defects 4. Spondylolisthesis, spondylosis, and/or spondyloarthropathies (include Degree/Grade of displacement in mm) (stability) 5. Vertebral body Fractures (acute/chronic) (state percentage of collapse) 6. Demineralization (osteopenia/osteoporotic) 7. Bone pathology 8. Foraminal narrowing  9. Surgical changes 10. Central, Lateral Recess, and/or Foraminal Stenosis (include AP diameter of stenosis in mm) 11. Surgical changes (hardware type, status, and presence of fibrosis) 12. Modic Type Changes (MRI only) 13. IVDD (Disc bulge, protrusion, herniation, extrusion) (Level, laterality, extent)    Standing Status:   Future    Standing Expiration Date:   10/17/2021    Scheduling Instructions:     Imaging must be done as soon as possible. Inform patient that order will expire within 30 days and I will not renew it.    Order Specific Question:   What is the patient's sedation requirement?    Answer:   No Sedation    Order Specific Question:   Does the patient have a pacemaker or implanted devices?    Answer:   No    Order Specific Question:   Preferred imaging location?    Answer:   [facility] (table limit-350lbs)    Order Specific Question:   Call Results- Best Contact Number?    Answer:   [phone] ([facility])    Order Specific Question:   Radiology Contrast Protocol - do NOT remove file path    Answer:   \\[email or web address]   MR THORACIC SPINE WO CONTRAST    Patient presents with axial pain with possible radicular component. Please assist [place] in identifying specific level(s) and laterality of any additional findings such as: 1. Facet (Zygapophyseal) joint DJD (Hypertrophy, space narrowing, subchondral sclerosis, and/or osteophyte formation) 2. DDD and/or IVDD (Loss of disc height, desiccation, gas patterns, osteophytes, endplate  sclerosis, or "Black disc disease") 3. Pars defects 4.  Spondylolisthesis, spondylosis, and/or spondyloarthropathies (include Degree/Grade of displacement in mm) (stability) 5. Vertebral body Fractures (acute/chronic) (state percentage of collapse) 6. Demineralization (osteopenia/osteoporotic) 7. Bone pathology 8. Foraminal narrowing  9. Surgical changes 10. Central, Lateral Recess, and/or Foraminal Stenosis (include AP diameter of stenosis in mm) 11. Surgical changes (hardware type, status, and presence of fibrosis) 12. Modic Type Changes (MRI only) 13. IVDD (Disc bulge, protrusion, herniation, extrusion) (Level, laterality, extent)    Standing Status:   Future    Standing Expiration Date:   10/17/2021    Scheduling Instructions:     Imaging must be done as soon as possible. Inform patient that order will expire within 30 days and I will not renew it.    Order Specific Question:   What is the patient's sedation requirement?    Answer:   No Sedation    Order Specific Question:   Does the patient have a pacemaker or implanted devices?    Answer:   No    Order Specific Question:   Preferred imaging location?    Answer:   [facility] (table limit-350lbs)    Order Specific Question:   Call Results- Best Contact Number?    Answer:   [phone] ([facility])    Order Specific Question:   Radiology Contrast Protocol - do NOT remove file path    Answer:   \\[email or web address]   Follow-up plan:   Return for will call patient after MRI.     B/L IA Knee steroid 12/12/19, 07/29/21: Helpful,  T10, 11, 12 MBB on 12/19/2019 followed by RFA (previosly done with Dr Maryjean Ka in 2018), status post bilateral T10, T11, T12 thoracic facet medial branch nerve block 8/23/ 2021 which provided 75% pain relief for 3-1/2 weeks with gradual return of pain thereafter.  Right T10, T11, T12 RFA on 01/30/2020; left T10, T11, T12 RFA on 02/20/2020, 12/05/20, 07/29/20    Recent Visits Date Type Provider Dept  08/12/21 Procedure visit Gillis Santa, MD [facility]  07/29/21 Procedure visit Gillis Santa, MD [facility]  07/16/21 Office Visit Gillis Santa, MD [facility]  Showing recent visits within past 90 days and meeting all other requirements Today's Visits Date Type Provider Dept  09/16/21 Office Visit Gillis Santa, MD [facility]  Showing today's visits and meeting all other requirements Future Appointments No visits were found meeting these conditions. Showing future appointments within next 90 days and meeting all other requirements  I discussed the assessment and treatment plan with the patient. The patient was provided an opportunity to ask questions and all were answered. The patient agreed with the plan and demonstrated an understanding of the instructions.  Patient advised to call back or seek an in-person evaluation if the symptoms or condition worsens.  Duration of encounter: 30 minutes.  Note by: Gillis Santa, MD Date: 09/16/2021; Time: 2:03 PM

## 2021-09-30 ENCOUNTER — Ambulatory Visit
Admission: RE | Admit: 2021-09-30 | Discharge: 2021-09-30 | Disposition: A | Discharge status: Home / Self Care | Payer: No Typology Code available for payment source | Source: Ambulatory Visit | Attending: Student in an Organized Health Care Education/Training Program | Admitting: Student in an Organized Health Care Education/Training Program

## 2021-09-30 DIAGNOSIS — M5134 Other intervertebral disc degeneration, thoracic region: Secondary | ICD-10-CM | POA: Insufficient documentation

## 2021-09-30 DIAGNOSIS — M47894 Other spondylosis, thoracic region: Secondary | ICD-10-CM

## 2021-09-30 DIAGNOSIS — G894 Chronic pain syndrome: Secondary | ICD-10-CM | POA: Diagnosis not present

## 2021-09-30 DIAGNOSIS — M47816 Spondylosis without myelopathy or radiculopathy, lumbar region: Secondary | ICD-10-CM | POA: Insufficient documentation

## 2021-09-30 DIAGNOSIS — M47814 Spondylosis without myelopathy or radiculopathy, thoracic region: Secondary | ICD-10-CM

## 2021-10-02 ENCOUNTER — Encounter: Payer: Self-pay | Admitting: Student in an Organized Health Care Education/Training Program

## 2021-10-02 ENCOUNTER — Ambulatory Visit
Payer: No Typology Code available for payment source | Attending: Student in an Organized Health Care Education/Training Program | Admitting: Student in an Organized Health Care Education/Training Program

## 2021-10-02 DIAGNOSIS — M47816 Spondylosis without myelopathy or radiculopathy, lumbar region: Secondary | ICD-10-CM | POA: Diagnosis not present

## 2021-10-02 DIAGNOSIS — M47814 Spondylosis without myelopathy or radiculopathy, thoracic region: Secondary | ICD-10-CM

## 2021-10-02 DIAGNOSIS — G894 Chronic pain syndrome: Secondary | ICD-10-CM | POA: Diagnosis not present

## 2021-10-02 DIAGNOSIS — M47894 Other spondylosis, thoracic region: Secondary | ICD-10-CM | POA: Diagnosis not present

## 2021-10-02 NOTE — Progress Notes (Signed)
Patient: Evan Cherry  Service Category: E/M  Provider: Gillis Santa, MD  DOB: 1966-02-19  DOS: 10/02/2021  Location: Office  MRN: [id number]  Setting: Ambulatory outpatient  Referring Provider: Tallin Giovanni, MD  Type: Established Patient  Specialty: Interventional Pain Management  PCP: Dovber Giovanni, MD  Location: Remote location  Delivery: TeleHealth     Virtual Encounter - Pain Management PROVIDER NOTE: Information contained herein reflects review and annotations entered in association with encounter. Interpretation of such information and data should be left to medically-trained personnel. Information provided to patient can be located elsewhere in the medical record under "Patient Instructions". Document created using STT-dictation technology, any transcriptional errors that may result from process are unintentional.    Contact & Pharmacy Preferred: [phone] Home: [phone] (home) Mobile: [phone] (mobile) E-mail: [email or web address]  [id number] - [place] [address], [place] AT [place] [address] [place] [address] Phone: [phone] Fax: [phone]   Pre-screening  Mr. Waybright offered "in-person" vs "virtual" encounter. He indicated preferring virtual for this encounter.   Reason COVID-19*  Social distancing based on CDC and AMA recommendations.   I contacted Evan Cherry on 10/02/2021 via telephone.      I clearly identified myself as Gillis Santa, MD. I verified that I was speaking with the correct person using two identifiers (Name: JARRAH BABICH, and date of birth: 02/03/1966).  Consent I sought verbal advanced consent from Evan Cherry for virtual visit interactions. I informed Mr. Marti of possible security and privacy concerns, risks, and limitations associated with providing "not-in-person" medical evaluation and management services. I also informed Mr. Lukin of the availability of  "in-person" appointments. Finally, I informed him that there would be a charge for the virtual visit and that he could be  personally, fully or partially, financially responsible for it. Mr. Quintela expressed understanding and agreed to proceed.   Historic Elements   Mr. SHAUNAK KREIS is a 56 y.o. year old, male patient evaluated today after our last contact on 09/16/2021. Mr. Auxier  has a past medical history of Asthma, Back pain, COPD (chronic obstructive pulmonary disease) ([place]), Decreased lung capacity, Hypertension, and Sleep apnea. He also  has no past surgical history on file. Mr. Latouche has a current medication list which includes the following prescription(s): acetaminophen, albuterol, albuterol, azelastine, benazepril, benazepril-hydrochlorthiazide, fasenra, budesonide-formoterol, cholecalciferol, ciclesonide, cyanocobalamin, cyanocobalamin, cyclobenzaprine, epinephrine, hydrochlorothiazide, lidocaine, loratadine, multiple vitamin, naproxen sodium, testosterone cypionate, tiotropium, tiotropium bromide monohydrate, tramadol, carboxymethylcellulose sodium, doxycycline, hydrocodone-acetaminophen, hydrocodone-acetaminophen, meloxicam, pravastatin, prednisone, and tizanidine. He  reports that he has quit smoking. His smokeless tobacco use includes snuff. He reports that he does not drink alcohol and does not use drugs. Mr. Goyer has No Known Allergies.   HPI  Today, he is being contacted for  review Thoracic and Lumbar MRI below   Laboratory Chemistry Profile   Renal Lab Results  Component Value Date   BUN 15 12/09/2014   CREATININE 0.82 12/09/2014   GFRAA >60 12/09/2014   GFRNONAA >60 12/09/2014    Hepatic Lab Results  Component Value Date   AST 25 12/08/2014   ALT 34 12/08/2014   ALBUMIN 4.4 12/08/2014   ALKPHOS 72 12/08/2014   LIPASE 171 12/26/2011    Electrolytes Lab Results  Component Value Date   NA 142 12/09/2014   K 4.2 12/09/2014   CL 107 12/09/2014   CALCIUM 9.2 12/09/2014     Bone No results  found for: VD25OH, [id number], [id number], [id number], 25OHVITD1, 25OHVITD2, 25OHVITD3, TESTOFREE, TESTOSTERONE  Inflammation (CRP: Acute Phase) (ESR: Chronic Phase) No results found for: CRP, ESRSEDRATE, LATICACIDVEN       Note: Above Lab results reviewed.  Imaging  MR LUMBAR SPINE WO CONTRAST CLINICAL DATA:  Osteoarthritis, thoracic Mid-back pain; Low back pain, symptoms persist with > 6 wks treatment Lumbar radiculopathy, symptoms persist with > 6 wks treatment  EXAM: MRI THORACIC AND LUMBAR SPINE WITHOUT CONTRAST  TECHNIQUE: Multiplanar and multiecho pulse sequences of the thoracic and lumbar spine were obtained without intravenous contrast.  COMPARISON:  None Available.  FINDINGS: MRI THORACIC SPINE FINDINGS  Alignment:  No significant listhesis.  Vertebrae: Vertebral body heights are maintained. There is minor degenerative endplate irregularity. No marrow edema. No suspicious osseous lesion.  Cord:  No abnormal signal.  Paraspinal and other soft tissues: Unremarkable.  Disc levels: No significant disc space narrowing. No disc herniation. No significant canal or foraminal stenosis.  MRI LUMBAR SPINE FINDINGS  Segmentation:  Standard.  Alignment:  No significant listhesis.  Vertebrae: Vertebral body heights are maintained. There is no marrow edema. No suspicious osseous lesion.  Conus medullaris and cauda equina: Conus extends to the L1-L2 level. Conus and cauda equina appear normal.  Paraspinal and other soft tissues: Unremarkable.  Disc levels: No significant disc space narrowing. No disc herniation. No significant canal or foraminal stenosis. Minor facet arthropathy at L5-S1.  IMPRESSION: No disc herniation or significant canal or foraminal narrowing.  Electronically Signed   By: Macy Mis M.D.   On: 10/01/2021 12:10 MR THORACIC SPINE WO CONTRAST CLINICAL DATA:  Osteoarthritis, thoracic Mid-back pain; Low back pain, symptoms  persist with > 6 wks treatment Lumbar radiculopathy, symptoms persist with > 6 wks treatment  EXAM: MRI THORACIC AND LUMBAR SPINE WITHOUT CONTRAST  TECHNIQUE: Multiplanar and multiecho pulse sequences of the thoracic and lumbar spine were obtained without intravenous contrast.  COMPARISON:  None Available.  FINDINGS: MRI THORACIC SPINE FINDINGS  Alignment:  No significant listhesis.  Vertebrae: Vertebral body heights are maintained. There is minor degenerative endplate irregularity. No marrow edema. No suspicious osseous lesion.  Cord:  No abnormal signal.  Paraspinal and other soft tissues: Unremarkable.  Disc levels: No significant disc space narrowing. No disc herniation. No significant canal or foraminal stenosis.  MRI LUMBAR SPINE FINDINGS  Segmentation:  Standard.  Alignment:  No significant listhesis.  Vertebrae: Vertebral body heights are maintained. There is no marrow edema. No suspicious osseous lesion.  Conus medullaris and cauda equina: Conus extends to the L1-L2 level. Conus and cauda equina appear normal.  Paraspinal and other soft tissues: Unremarkable.  Disc levels: No significant disc space narrowing. No disc herniation. No significant canal or foraminal stenosis. Minor facet arthropathy at L5-S1.  IMPRESSION: No disc herniation or significant canal or foraminal narrowing.  Electronically Signed   By: Macy Mis M.D.   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 Diagnoses of Thoracic spondylosis without myelopathy, Lumbar spondylosis, Lumbar facet arthropathy, and Chronic pain syndrome were also pertinent to this visit.  Plan of Care  Problem-specific:  No problem-specific Assessment & Plan notes found for this encounter.  Mr. WOFFORD STRATTON has a current medication list which includes the following long-term medication(s): albuterol, albuterol, azelastine, benazepril,  benazepril-hydrochlorthiazide, budesonide-formoterol, ciclesonide, hydrochlorothiazide, loratadine, testosterone cypionate, tiotropium, tiotropium bromide monohydrate, and pravastatin.   Review thoracic and lumbar MRI with patient.  Discussed trigger point injections in his thoracic  region for his persistent mid back pain.  Orders:  Orders Placed This Encounter  Procedures   TRIGGER POINT INJECTION    Standing Status:   Future    Standing Expiration Date:   01/02/2022    Scheduling Instructions:     Trigger point injections, thoracic    Order Specific Question:   Where will this procedure be performed?    Answer:   [facility]   Follow-up plan:   Return in about 5 days (around 10/07/2021) for thoracic TPI , in clinic NS.     B/L IA Knee steroid 12/12/19, 07/29/21: Helpful,  T10, 11, 12 MBB on 12/19/2019 followed by RFA (previosly done with Dr Maryjean Ka in 2018), status post bilateral T10, T11, T12 thoracic facet medial branch nerve block 8/23/ 2021 which provided 75% pain relief for 3-1/2 weeks with gradual return of pain thereafter.  Right T10, T11, T12 RFA on 01/30/2020; left T10, T11, T12 RFA on 02/20/2020, 12/05/20, 07/29/20     Recent Visits Date Type Provider Dept  09/16/21 Office Visit Gillis Santa, MD [facility]  08/12/21 Procedure visit Gillis Santa, MD [facility]  07/29/21 Procedure visit Gillis Santa, MD [facility]  07/16/21 Office Visit Gillis Santa, MD [facility]  Showing recent visits within past 90 days and meeting all other requirements Today's Visits Date Type Provider Dept  10/02/21 Office Visit Gillis Santa, MD [facility]  Showing today's visits and meeting all other requirements Future Appointments No visits were found meeting these conditions. Showing future appointments within next 90 days and meeting all other requirements  I discussed the assessment and treatment plan with the patient. The patient was  provided an opportunity to ask questions and all were answered. The patient agreed with the plan and demonstrated an understanding of the instructions.  Patient advised to call back or seek an in-person evaluation if the symptoms or condition worsens.  Duration of encounter: 14mnutes.  Note by: BGillis Santa MD Date: 10/02/2021; Time: 11:41 AM

## 2021-10-07 ENCOUNTER — Ambulatory Visit
Payer: No Typology Code available for payment source | Attending: Student in an Organized Health Care Education/Training Program | Admitting: Student in an Organized Health Care Education/Training Program

## 2021-10-07 ENCOUNTER — Encounter: Payer: Self-pay | Admitting: Student in an Organized Health Care Education/Training Program

## 2021-10-07 DIAGNOSIS — G894 Chronic pain syndrome: Secondary | ICD-10-CM

## 2021-10-07 DIAGNOSIS — M47894 Other spondylosis, thoracic region: Secondary | ICD-10-CM | POA: Diagnosis present

## 2021-10-07 DIAGNOSIS — M47814 Spondylosis without myelopathy or radiculopathy, thoracic region: Secondary | ICD-10-CM | POA: Diagnosis present

## 2021-10-07 MED ORDER — ROPIVACAINE HCL 2 MG/ML IJ SOLN
INTRAMUSCULAR | Status: AC
  Filled 2021-10-07: qty 20

## 2021-10-07 MED ORDER — ROPIVACAINE HCL 2 MG/ML IJ SOLN
9.0000 mL | Freq: Once | INTRAMUSCULAR | Status: AC
  Administered 2021-10-07: 20 mL

## 2021-10-07 MED ORDER — DEXAMETHASONE SODIUM PHOSPHATE 10 MG/ML IJ SOLN
10.0000 mg | Freq: Once | INTRAMUSCULAR | Status: AC
  Administered 2021-10-07: 10 mg

## 2021-10-07 MED ORDER — DEXAMETHASONE SODIUM PHOSPHATE 10 MG/ML IJ SOLN
INTRAMUSCULAR | Status: AC
  Filled 2021-10-07: qty 1

## 2021-10-07 NOTE — Patient Instructions (Signed)

## 2021-10-07 NOTE — Progress Notes (Signed)
PROVIDER NOTE: Interpretation of information contained herein should be left to medically-trained personnel. Specific patient instructions are provided elsewhere under "Patient Instructions" section of medical record. This document was created in part using STT-dictation technology, any transcriptional errors that may result from this process are unintentional.  Patient: Evan Cherry Type: Established DOB: 05/09/1965 MRN: [id number] PCP: Acey Giovanni, MD  Service: Procedure DOS: 10/07/2021 Setting: Ambulatory Location: Ambulatory outpatient facility Delivery: Face-to-face Provider: Gillis Santa, MD Specialty: Interventional Pain Management Specialty designation: 09 Location: Outpatient facility Ref. Prov.: Gillis Santa, MD    Primary Reason for Visit: Interventional Pain Management Treatment. CC: Back Pain  Thoracic and lumbar trigger point injections, 3+ muscle groups: Latissimus dorsi, erector spinae, iliopsoas  1. Thoracic facet joint syndrome   2. Thoracic spondylosis without myelopathy   3. Chronic pain syndrome    NAS-11 Pain score:   Pre-procedure: 4 /10   Post-procedure: 4 /10     Pre-op H&P Assessment:  Mr. Michon is a 56 y.o. (year old), male patient, seen today for interventional treatment. He  has no past surgical history on file. Mr. Figg has a current medication list which includes the following prescription(s): acetaminophen, albuterol, albuterol, azelastine, benazepril, benazepril-hydrochlorthiazide, fasenra, budesonide-formoterol, cholecalciferol, ciclesonide, cyanocobalamin, cyanocobalamin, cyclobenzaprine, epinephrine, hydrochlorothiazide, hydrocodone-acetaminophen, lidocaine, meloxicam, naproxen sodium, testosterone cypionate, tiotropium, tiotropium bromide monohydrate, tramadol, carboxymethylcellulose sodium, doxycycline, hydrocodone-acetaminophen, loratadine, multiple vitamin, pravastatin, prednisone, and tizanidine. His primarily concern today is the Back  Pain  Initial Vital Signs:  Pulse/HCG Rate: 71  Temp: 98 F (36.7 C) Resp: 16 BP: 127/73 SpO2: 97 %  BMI: Estimated body mass index is 34.97 kg/m as calculated from the following:   Height as of this encounter: '5\' 8"'$  (1.727 m).   Weight as of this encounter: 230 lb (104.3 kg).  Risk Assessment: Allergies: Reviewed. He has No Known Allergies.  Allergy Precautions: None required Coagulopathies: Reviewed. None identified.  Blood-thinner therapy: None at this time Active Infection(s): Reviewed. None identified. Mr. Martensen is afebrile  Site Confirmation: Mr. Maceachern was asked to confirm the procedure and laterality before marking the site Procedure checklist: Completed Consent: Before the procedure and under the influence of no sedative(s), amnesic(s), or anxiolytics, the patient was informed of the treatment options, risks and possible complications. To fulfill our ethical and legal obligations, as recommended by the American Medical Association's Code of Ethics, I have informed the patient of my clinical impression; the nature and purpose of the treatment or procedure; the risks, benefits, and possible complications of the intervention; the alternatives, including doing nothing; the risk(s) and benefit(s) of the alternative treatment(s) or procedure(s); and the risk(s) and benefit(s) of doing nothing. The patient was provided information about the general risks and possible complications associated with the procedure. These may include, but are not limited to: failure to achieve desired goals, infection, bleeding, organ or nerve damage, allergic reactions, paralysis, and death. In addition, the patient was informed of those risks and complications associated to the procedure, such as failure to decrease pain; infection; bleeding; organ or nerve damage with subsequent damage to sensory, motor, and/or autonomic systems, resulting in permanent pain, numbness, and/or weakness of one or several areas of  the body; allergic reactions; (i.e.: anaphylactic reaction); and/or death. Furthermore, the patient was informed of those risks and complications associated with the medications. These include, but are not limited to: allergic reactions (i.e.: anaphylactic or anaphylactoid reaction(s)); adrenal axis suppression; blood sugar elevation that in diabetics may result in ketoacidosis or comma; water retention that in  patients with history of congestive heart failure may result in shortness of breath, pulmonary edema, and decompensation with resultant heart failure; weight gain; swelling or edema; medication-induced neural toxicity; particulate matter embolism and blood vessel occlusion with resultant organ, and/or nervous system infarction; and/or aseptic necrosis of one or more joints. Finally, the patient was informed that Medicine is not an exact science; therefore, there is also the possibility of unforeseen or unpredictable risks and/or possible complications that may result in a catastrophic outcome. The patient indicated having understood very clearly. We have given the patient no guarantees and we have made no promises. Enough time was given to the patient to ask questions, all of which were answered to the patient's satisfaction. Mr. Hagood has indicated that he wanted to continue with the procedure. Attestation: I, the ordering provider, attest that I have discussed with the patient the benefits, risks, side-effects, alternatives, likelihood of achieving goals, and potential problems during recovery for the procedure that I have provided informed consent. Date  Time: 10/07/2021 10:33 AM  Pre-Procedure Preparation:  Monitoring: As per clinic protocol. Respiration, ETCO2, SpO2, BP, heart rate and rhythm monitor placed and checked for adequate function Safety Precautions: Patient was assessed for positional comfort and pressure points before starting the procedure. Time-out: I initiated and conducted the  "Time-out" before starting the procedure, as per protocol. The patient was asked to participate by confirming the accuracy of the "Time Out" information. Verification of the correct person, site, and procedure were performed and confirmed by me, the nursing staff, and the patient. "Time-out" conducted as per Joint Commission's Universal Protocol (UP.01.01.01). Time: 1115  Description of Procedure:          Area Prepped: Entire thoracolumbar DuraPrep (Iodine Povacrylex [0.7% available iodine] and Isopropyl Alcohol, 74% w/w) Safety Precautions: Aspiration looking for blood return was conducted prior to all injections. At no point did we inject any substances, as a needle was being advanced. No attempts were made at seeking any paresthesias. Safe injection practices and needle disposal techniques used. Medications properly checked for expiration dates. SDV (single dose vial) medications used. Description of the Procedure: Protocol guidelines were followed. The patient was placed in position over the fluoroscopy table. The target area was identified and the area prepped in the usual manner. Skin & deeper tissues infiltrated with local anesthetic. Appropriate amount of time allowed to pass for local anesthetics to take effect. The procedure needles were then advanced to the target area. Proper needle placement secured. Negative aspiration confirmed. Solution injected in intermittent fashion, asking for systemic symptoms every 0.5cc of injectate. The needles were then removed and the area cleansed, making sure to leave some of the prepping solution back to take advantage of its long term bactericidal properties.  Vitals:   10/07/21 1055  BP: 127/73  Pulse: 71  Resp: 16  Temp: 98 F (36.7 C)  TempSrc: Temporal  SpO2: 97%  Weight: 230 lb (104.3 kg)  Height: '5\' 8"'$  (1.727 m)    Start Time: 1115 hrs. End Time: 1120 hrs. Materials:   Medication(s): Please see orders for medications and dosing  details.  Approximately 18 trigger points were injected with 1.5 inch needle and 0.5 to 1 cc of 0.2% ropivacaine was injected in each trigger point with needling performed.  Post-operative Assessment:  Post-procedure Vital Signs:  Pulse/HCG Rate: 71  Temp: 98 F (36.7 C) Resp: 16 BP: 127/73 SpO2: 97 %  EBL: None  Complications: No immediate post-treatment complications observed by team, or reported by  patient.  Note: The patient tolerated the entire procedure well. A repeat set of vitals were taken after the procedure and the patient was kept under observation following institutional policy, for this type of procedure. Post-procedural neurological assessment was performed, showing return to baseline, prior to discharge. The patient was provided with post-procedure discharge instructions, including a section on how to identify potential problems. Should any problems arise concerning this procedure, the patient was given instructions to immediately contact us, at any time, without hesitation. In any case, we plan to contact the patient by telephone for a follow-up status report regarding this interventional procedure.  Comments:  No additional relevant information.  Plan of Care  Orders:  No orders of the defined types were placed in this encounter.   Medications ordered for procedure: Meds ordered this encounter  Medications   ropivacaine (PF) 2 mg/mL (0.2%) (NAROPIN) injection 9 mL   dexamethasone (DECADRON) injection 10 mg   Medications administered: We administered ropivacaine (PF) 2 mg/mL (0.2%) and dexamethasone.  See the medical record for exact dosing, route, and time of administration.  Follow-up plan:   Return in about 4 weeks (around 11/04/2021) for Post Procedure Evaluation, virtual.       B/L IA Knee steroid 12/12/19, 07/29/21: Helpful,  T10, 11, 12 MBB on 12/19/2019 followed by RFA (previosly done with Dr Maryjean Ka in 2018), status post bilateral T10, T11, T12 thoracic  facet medial branch nerve block 8/23/ 2021 which provided 75% pain relief for 3-1/2 weeks with gradual return of pain thereafter.  Right T10, T11, T12 RFA on 01/30/2020; left T10, T11, T12 RFA on 02/20/2020, 12/05/20, 07/29/20      Recent Visits Date Type Provider Dept  10/02/21 Office Visit Gillis Santa, MD [facility]  09/16/21 Office Visit Gillis Santa, MD [facility]  08/12/21 Procedure visit Gillis Santa, MD [facility]  07/29/21 Procedure visit Gillis Santa, MD [facility]  07/16/21 Office Visit Gillis Santa, MD [facility]  Showing recent visits within past 90 days and meeting all other requirements Today's Visits Date Type Provider Dept  10/07/21 Procedure visit Gillis Santa, MD [facility]  Showing today's visits and meeting all other requirements Future Appointments Date Type Provider Dept  11/12/21 Appointment Gillis Santa, MD [facility]  Showing future appointments within next 90 days and meeting all other requirements  Disposition: Discharge home  Discharge (Date  Time): 10/07/2021; 1125 hrs.   Primary Care Physician: Aizen Giovanni, MD Location: [facility] Note by: Gillis Santa, MD Date: 10/07/2021; Time: 11:33 AM  Disclaimer:  Medicine is not an exact science. The only guarantee in medicine is that nothing is guaranteed. It is important to note that the decision to proceed with this intervention was based on the information collected from the patient. The Data and conclusions were drawn from the patient's questionnaire, the interview, and the physical examination. Because the information was provided in large part by the patient, it cannot be guaranteed that it has not been purposely or unconsciously manipulated. Every effort has been made to obtain as much relevant data as possible for this evaluation. It is important to note that the conclusions that lead to this  procedure are derived in large part from the available data. Always take into account that the treatment will also be dependent on availability of resources and existing treatment guidelines, considered by other Pain Management Practitioners as being common knowledge and practice, at the time of the intervention.  For Medico-Legal purposes, it is also important to point out that variation in procedural techniques and pharmacological choices are the acceptable norm. The indications, contraindications, technique, and results of the above procedure should only be interpreted and judged by a Board-Certified Interventional Pain Specialist with extensive familiarity and expertise in the same exact procedure and technique.

## 2021-10-07 NOTE — Progress Notes (Signed)
Safety precautions to be maintained throughout the outpatient stay will include: orient to surroundings, keep bed in low position, maintain call bell within reach at all times, provide assistance with transfer out of bed and ambulation.  

## 2021-10-08 ENCOUNTER — Telehealth: Payer: Self-pay

## 2021-10-08 NOTE — Telephone Encounter (Signed)
Post procedure phone call.  Patient states he is doing off and on.  The pain was better then it wasn't.  It is feeling some better this morning.  I instructed him to document on his pain diary and to call us back for any increase in symptoms.

## 2021-11-11 ENCOUNTER — Encounter: Payer: Self-pay | Admitting: Student in an Organized Health Care Education/Training Program

## 2021-11-12 ENCOUNTER — Encounter: Payer: Self-pay | Admitting: Student in an Organized Health Care Education/Training Program

## 2021-11-12 ENCOUNTER — Ambulatory Visit
Payer: No Typology Code available for payment source | Attending: Student in an Organized Health Care Education/Training Program | Admitting: Student in an Organized Health Care Education/Training Program

## 2021-11-12 DIAGNOSIS — G894 Chronic pain syndrome: Secondary | ICD-10-CM | POA: Diagnosis not present

## 2021-11-12 DIAGNOSIS — M47814 Spondylosis without myelopathy or radiculopathy, thoracic region: Secondary | ICD-10-CM

## 2021-11-12 DIAGNOSIS — M47816 Spondylosis without myelopathy or radiculopathy, lumbar region: Secondary | ICD-10-CM

## 2021-11-12 DIAGNOSIS — M47894 Other spondylosis, thoracic region: Secondary | ICD-10-CM | POA: Diagnosis not present

## 2021-11-12 NOTE — Progress Notes (Signed)
Patient: Evan Cherry  Service Category: E/M  Provider: Bilal Lateef, MD  DOB: 07/04/1965  DOS: 11/12/2021  Location: Office  MRN: [id number]  Setting: Ambulatory outpatient  Referring Provider: Lombardo, Jessica L, MD  Type: Established Patient  Specialty: Interventional Pain Management  PCP: Lombardo, Jessica L, MD  Location: Remote location  Delivery: TeleHealth     Virtual Encounter - Pain Management PROVIDER NOTE: Information contained herein reflects review and annotations entered in association with encounter. Interpretation of such information and data should be left to medically-trained personnel. Information provided to patient can be located elsewhere in the medical record under "Patient Instructions". Document created using STT-dictation technology, any transcriptional errors that may result from process are unintentional.    Contact & Pharmacy Preferred: [phone] Home: [phone] (home) Mobile: [phone] (mobile) E-mail: [email or web address]  [id number] - [place], [place] - [address] AT [address] OF [address] & [address] [place]  [address] Phone: [phone] Fax: [phone]   Pre-screening  Evan Cherry offered "in-person" vs "virtual" encounter. He indicated preferring virtual for this encounter.   Reason COVID-19*  Social distancing based on CDC and AMA recommendations.   I contacted Evan Cherry on 11/12/2021 via telephone.      I clearly identified myself as Bilal Lateef, MD. I verified that I was speaking with the correct person using two identifiers (Name: Evan Cherry, and date of birth: 11/01/1965).  Consent I sought verbal advanced consent from Evan Cherry for virtual visit interactions. I informed Evan Cherry of possible security and privacy concerns, risks, and limitations associated with providing "not-in-person" medical evaluation and management services. I also informed Evan Cherry of the availability of  "in-person" appointments. Finally, I informed him that there would be a charge for the virtual visit and that he could be  personally, fully or partially, financially responsible for it. Evan Cherry expressed understanding and agreed to proceed.   Historic Elements   Evan Cherry is a 56 y.o. year old, male patient evaluated today after our last contact on 10/07/2021. Evan Cherry  has a past medical history of Asthma, Back pain, COPD (chronic obstructive pulmonary disease) (HCC), Decreased lung capacity, Hypertension, and Sleep apnea. He also  has no past surgical history on file. Evan Cherry has a current medication list which includes the following prescription(s): acetaminophen, albuterol, albuterol, azelastine, benazepril, benazepril-hydrochlorthiazide, fasenra, budesonide-formoterol, cholecalciferol, ciclesonide, cyanocobalamin, cyanocobalamin, cyclobenzaprine, doxycycline, epinephrine, hydrochlorothiazide, hydrocodone-acetaminophen, lidocaine, meloxicam, naproxen sodium, testosterone cypionate, tiotropium, tiotropium bromide monohydrate, tramadol, carboxymethylcellulose sodium, hydrocodone-acetaminophen, loratadine, multiple vitamin, pravastatin, prednisone, and tizanidine. He  reports that he has quit smoking. His smokeless tobacco use includes snuff. He reports that he does not drink alcohol and does not use drugs. Evan Cherry has No Known Allergies.   HPI  Today, he is being contacted for a post-procedure assessment.   Post-procedure evaluation   Thoracic and lumbar trigger point injections, 3+ muscle groups: Latissimus dorsi, erector spinae, iliopsoas  Effectiveness:  Initial hour after procedure: 75 %  Subsequent 4-6 hours post-procedure: 75 %  Analgesia past initial 6 hours: 50 % (1 week)  Ongoing improvement:  Analgesic:  back to baseline    Laboratory Chemistry Profile   Renal Lab Results  Component Value Date   BUN 15 12/09/2014   CREATININE 0.82 12/09/2014   GFRAA >60 12/09/2014    GFRNONAA >60 12/09/2014    Hepatic Lab Results  Component Value Date   AST 25 12/08/2014   ALT 34   12/08/2014   ALBUMIN 4.4 12/08/2014   ALKPHOS 72 12/08/2014   LIPASE 171 12/26/2011    Electrolytes Lab Results  Component Value Date   NA 142 12/09/2014   K 4.2 12/09/2014   CL 107 12/09/2014   CALCIUM 9.2 12/09/2014    Bone No results found for: "VD25OH", "VD125OH2TOT", "OT1572IO0", "BT5974BU3", "25OHVITD1", "25OHVITD2", "25OHVITD3", "TESTOFREE", "TESTOSTERONE"  Inflammation (CRP: Acute Phase) (ESR: Chronic Phase) No results found for: "CRP", "ESRSEDRATE", "LATICACIDVEN"       Note: Above Lab results reviewed.  Imaging  MR LUMBAR SPINE WO CONTRAST CLINICAL DATA:  Osteoarthritis, thoracic Mid-back pain; Low back pain, symptoms persist with > 6 wks treatment Lumbar radiculopathy, symptoms persist with > 6 wks treatment  EXAM: MRI THORACIC AND LUMBAR SPINE WITHOUT CONTRAST  TECHNIQUE: Multiplanar and multiecho pulse sequences of the thoracic and lumbar spine were obtained without intravenous contrast.  COMPARISON:  None Available.  FINDINGS: MRI THORACIC SPINE FINDINGS  Alignment:  No significant listhesis.  Vertebrae: Vertebral body heights are maintained. There is minor degenerative endplate irregularity. No marrow edema. No suspicious osseous lesion.  Cord:  No abnormal signal.  Paraspinal and other soft tissues: Unremarkable.  Disc levels: No significant disc space narrowing. No disc herniation. No significant canal or foraminal stenosis.  MRI LUMBAR SPINE FINDINGS  Segmentation:  Standard.  Alignment:  No significant listhesis.  Vertebrae: Vertebral body heights are maintained. There is no marrow edema. No suspicious osseous lesion.  Conus medullaris and cauda equina: Conus extends to the L1-L2 level. Conus and cauda equina appear normal.  Paraspinal and other soft tissues: Unremarkable.  Disc levels: No significant disc space narrowing. No  disc herniation. No significant canal or foraminal stenosis. Minor facet arthropathy at L5-S1.  IMPRESSION: No disc herniation or significant canal or foraminal narrowing.  Electronically Signed   By: Macy Mis M.D.   On: 10/01/2021 12:10 MR THORACIC SPINE WO CONTRAST CLINICAL DATA:  Osteoarthritis, thoracic Mid-back pain; Low back pain, symptoms persist with > 6 wks treatment Lumbar radiculopathy, symptoms persist with > 6 wks treatment  EXAM: MRI THORACIC AND LUMBAR SPINE WITHOUT CONTRAST  TECHNIQUE: Multiplanar and multiecho pulse sequences of the thoracic and lumbar spine were obtained without intravenous contrast.  COMPARISON:  None Available.  FINDINGS: MRI THORACIC SPINE FINDINGS  Alignment:  No significant listhesis.  Vertebrae: Vertebral body heights are maintained. There is minor degenerative endplate irregularity. No marrow edema. No suspicious osseous lesion.  Cord:  No abnormal signal.  Paraspinal and other soft tissues: Unremarkable.  Disc levels: No significant disc space narrowing. No disc herniation. No significant canal or foraminal stenosis.  MRI LUMBAR SPINE FINDINGS  Segmentation:  Standard.  Alignment:  No significant listhesis.  Vertebrae: Vertebral body heights are maintained. There is no marrow edema. No suspicious osseous lesion.  Conus medullaris and cauda equina: Conus extends to the L1-L2 level. Conus and cauda equina appear normal.  Paraspinal and other soft tissues: Unremarkable.  Disc levels: No significant disc space narrowing. No disc herniation. No significant canal or foraminal stenosis. Minor facet arthropathy at L5-S1.  IMPRESSION: No disc herniation or significant canal or foraminal narrowing.  Electronically Signed   By: Macy Mis M.D.   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 Diagnoses of Thoracic spondylosis without myelopathy, Lumbar  spondylosis, Lumbar facet arthropathy, and Chronic pain syndrome were also pertinent to this visit.  Plan of Care  Unfortunately, Mr. Mcgarvey continues to have persistent mid back pain  that has been refractory to thoracic medial branch RFA and thoracic trigger point injections.  His repeat thoracic MRI is largely unremarkable and does not show any thoracic disc herniations, facet disease, neuroforaminal stenosis.  Patient could benefit from a second opinion with neurology or orthopedics.  He states that he will reach out to his primary.  He can follow-up with me as needed.   Follow-up plan:   Return if symptoms worsen or fail to improve.     B/L IA Knee steroid 12/12/19, 07/29/21: Helpful,  T10, 11, 12 MBB on 12/19/2019 followed by RFA (previosly done with Dr Maryjean Ka in 2018), status post bilateral T10, T11, T12 thoracic facet medial branch nerve block 8/23/ 2021 which provided 75% pain relief for 3-1/2 weeks with gradual return of pain thereafter.  Right T10, T11, T12 RFA on 01/30/2020; left T10, T11, T12 RFA on 02/20/2020, 12/05/20, 07/29/20       Recent Visits Date Type Provider Dept  10/07/21 Procedure visit Gillis Santa, MD [facility]  10/02/21 Office Visit Gillis Santa, MD [facility]  09/16/21 Office Visit Gillis Santa, MD [facility]  Showing recent visits within past 90 days and meeting all other requirements Today's Visits Date Type Provider Dept  11/12/21 Office Visit Gillis Santa, MD [facility]  Showing today's visits and meeting all other requirements Future Appointments No visits were found meeting these conditions. Showing future appointments within next 90 days and meeting all other requirements  I discussed the assessment and treatment plan with the patient. The patient was provided an opportunity to ask questions and all were answered. The patient agreed with the plan and demonstrated an understanding of the instructions.  Patient  advised to call back or seek an in-person evaluation if the symptoms or condition worsens.  Duration of encounter: 53mnutes.  Note by: BGillis Santa MD Date: 11/12/2021; Time: 3:29 PM

## 2022-04-14 ENCOUNTER — Ambulatory Visit
Payer: No Typology Code available for payment source | Attending: Student in an Organized Health Care Education/Training Program | Admitting: Student in an Organized Health Care Education/Training Program

## 2022-04-14 DIAGNOSIS — M47894 Other spondylosis, thoracic region: Secondary | ICD-10-CM | POA: Diagnosis present

## 2022-04-14 NOTE — Progress Notes (Signed)
Safety precautions to be maintained throughout the outpatient stay will include: orient to surroundings, keep bed in low position, maintain call bell within reach at all times, provide assistance with transfer out of bed and ambulation.  

## 2022-04-14 NOTE — Progress Notes (Signed)
We do not have updated MRI records from the [place].  I have informed my staff to expedite getting those imaging studies along with results.  As soon as we do, I will have Evan Cherry scheduled to review studies and to discuss treatment plan.  No charge for visit today.

## 2022-04-24 ENCOUNTER — Encounter: Payer: Self-pay | Admitting: Student in an Organized Health Care Education/Training Program

## 2022-04-24 ENCOUNTER — Ambulatory Visit
Payer: No Typology Code available for payment source | Attending: Student in an Organized Health Care Education/Training Program | Admitting: Student in an Organized Health Care Education/Training Program

## 2022-04-24 VITALS — BP 154/74 | HR 85 | Temp 98.2°F | Resp 16 | Ht 68.0 in | Wt 236.0 lb

## 2022-04-24 DIAGNOSIS — M5126 Other intervertebral disc displacement, lumbar region: Secondary | ICD-10-CM | POA: Insufficient documentation

## 2022-04-24 DIAGNOSIS — M5020 Other cervical disc displacement, unspecified cervical region: Secondary | ICD-10-CM | POA: Insufficient documentation

## 2022-04-24 DIAGNOSIS — M5412 Radiculopathy, cervical region: Secondary | ICD-10-CM | POA: Diagnosis present

## 2022-04-24 DIAGNOSIS — S46011S Strain of muscle(s) and tendon(s) of the rotator cuff of right shoulder, sequela: Secondary | ICD-10-CM

## 2022-04-24 DIAGNOSIS — G5681 Other specified mononeuropathies of right upper limb: Secondary | ICD-10-CM | POA: Diagnosis present

## 2022-04-24 DIAGNOSIS — S46011A Strain of muscle(s) and tendon(s) of the rotator cuff of right shoulder, initial encounter: Secondary | ICD-10-CM | POA: Insufficient documentation

## 2022-04-24 DIAGNOSIS — M25511 Pain in right shoulder: Secondary | ICD-10-CM | POA: Insufficient documentation

## 2022-04-24 DIAGNOSIS — M50222 Other cervical disc displacement at C5-C6 level: Secondary | ICD-10-CM

## 2022-04-24 DIAGNOSIS — M5416 Radiculopathy, lumbar region: Secondary | ICD-10-CM | POA: Insufficient documentation

## 2022-04-24 DIAGNOSIS — G8929 Other chronic pain: Secondary | ICD-10-CM | POA: Insufficient documentation

## 2022-04-24 NOTE — Progress Notes (Signed)
Safety precautions to be maintained throughout the outpatient stay will include: orient to surroundings, keep bed in low position, maintain call bell within reach at all times, provide assistance with transfer out of bed and ambulation.  

## 2022-04-24 NOTE — Progress Notes (Signed)
PROVIDER NOTE: Information contained herein reflects review and annotations entered in association with encounter. Interpretation of such information and data should be left to medically-trained personnel. Information provided to patient can be located elsewhere in the medical record under "Patient Instructions". Document created using STT-dictation technology, any transcriptional errors that may result from process are unintentional.    Patient: Evan Cherry  Service Category: E/M  Provider: Gillis Santa, MD  DOB: December 11, 1965  DOS: 04/24/2022  Referring Provider: Coleston Giovanni, MD  MRN: [id number]  Specialty: Interventional Pain Management  PCP: Kalup Giovanni, MD  Type: Established Patient  Setting: Ambulatory outpatient    Location: Office  Delivery: Face-to-face     HPI  [name], a 56 y.o. year old male, is here today because of his Lumbar radicular pain [M54.16]. [name] primary complain today is Back Pain (Thoracic bilateral more on the left ) and Neck Pain (Midline ) Last encounter: My last encounter with him was on 04/14/2022. Pertinent problems: [name] has Bilateral post-traumatic osteoarthritis of knee; Chronic pain of both knees; Thoracic facet joint syndrome; Thoracic spondylosis without myelopathy; and Lumbar facet arthropathy on their pertinent problem list. Pain Assessment: Severity of Chronic pain is reported as a 4 /10. Location: Back (neck) Mid, Left, Right/back pain down left leg. Onset: More than a month ago. Quality: Discomfort, Constant, Burning, Tingling, Cramping, Headache. Timing: Constant. Modifying factor(s): pain meds ice/heat. Vitals:  height is _0  (1.727 m) and weight is 236 lb (107 kg). His temporal temperature is 98.2 F (36.8 C). His blood pressure is 154/74 (abnormal) and his pulse is 85. His respiration is 16 and oxygen saturation is 98%.  BMI: Estimated body mass index is 35.88 kg/m as calculated from the following:   Height as of this  encounter: _1  (1.727 m).   Weight as of this encounter: 236 lb (107 kg).  Reason for encounter: evaluation for possible interventional PM therapy/treatment.   Evan Cherry presents today to discuss his MRI results which we obtained from the [place].  His primary pain is his lower back pain with radiation into his left lateral leg stopping proximal to his left knee.  His recent MRI shows left anterior disc extrusion into the ventral paraspinal soft tissues at L3-L4.  He also has mild bilateral facet arthropathy at L4-L5.  We discussed a left L3-L4 epidural steroid injection, interlaminar approach to help address this.  Risk and benefits reviewed and patient would like to proceed.  Patient also has cervical spine pain and associated headaches.  His cervical MRI shows a disc bulge with superimposed disc extrusion at C5-C6 with inferior migration of the disc contents.  This indents the anterior thecal sac without significant spinal stenosis.  There is also mild facet arthrosis with ligamentum flavum buckling in this area.  For this we have discussed a cervical epidural steroid injection.  Patient also has severe right shoulder pain and difficulty with shoulder abduction and shoulder internal rotation.  His shoulder MRI shows right supraspinatus tendinosis with full thickness tearing of the anterior fibers as well as subscapularis and infraspinatus tendinosis with interstitial tearing of the infraspinatus.  I have offered him a right suprascapular nerve block and possible RFA.  Risk and benefits of this procedure were reviewed and patient would like to proceed.  ROS  Constitutional: Denies any fever or chills Gastrointestinal: No reported hemesis, hematochezia, vomiting, or acute GI distress Musculoskeletal:  Low back pain with radiation into left leg, cervical spine pain, headaches,  right shoulder pain Neurological: No reported episodes of acute onset apraxia, aphasia, dysarthria, agnosia, amnesia, paralysis,  loss of coordination, or loss of consciousness  Medication Review  Benralizumab, Carboxymethylcellulose Sodium, Cyanocobalamin, Dupilumab, EPINEPHrine, HYDROcodone-acetaminophen, Multiple Vitamin, Tiotropium Bromide Monohydrate, acetaminophen, albuterol, azelastine, benazepril, benazepril-hydrochlorthiazide, budesonide-formoterol, cholecalciferol, ciclesonide, cyanocobalamin, cyclobenzaprine, diclofenac Sodium, doxycycline, guaiFENesin, hydrochlorothiazide, lidocaine, loratadine, meloxicam, morphine, naproxen, naproxen sodium, pravastatin, predniSONE, testosterone cypionate, tiZANidine, tiotropium, and traMADol  History Review  Allergy: [name] has No Known Allergies. Drug: [name]  reports no history of drug use. Alcohol:  reports no history of alcohol use. Tobacco:  reports that he has quit smoking. His smokeless tobacco use includes snuff. Social: [name]  reports that he has quit smoking. His smokeless tobacco use includes snuff. He reports that he does not drink alcohol and does not use drugs. Medical:  has a past medical history of Asthma, Back pain, COPD (chronic obstructive pulmonary disease) ([place]), Decreased lung capacity, Hypertension, and Sleep apnea. Surgical: [name]  has no past surgical history on file. Family: family history is not on file. He was adopted.  Laboratory Chemistry Profile   Renal Lab Results  Component Value Date   BUN 15 12/09/2014   CREATININE 0.82 12/09/2014   GFRAA >60 12/09/2014   GFRNONAA >60 12/09/2014    Hepatic Lab Results  Component Value Date   AST 25 12/08/2014   ALT 34 12/08/2014   ALBUMIN 4.4 12/08/2014   ALKPHOS 72 12/08/2014   LIPASE 171 12/26/2011    Electrolytes Lab Results  Component Value Date   NA 142 12/09/2014   K 4.2 12/09/2014   CL 107 12/09/2014   CALCIUM 9.2 12/09/2014    Bone No results found for: "VD25OH", "VD125OH2TOT", "GY6948NI6", "EV0350KX3", "25OHVITD1", "25OHVITD2", "25OHVITD3", "TESTOFREE", "TESTOSTERONE"   Inflammation (CRP: Acute Phase) (ESR: Chronic Phase) No results found for: "CRP", "ESRSEDRATE", "LATICACIDVEN"       Note: Above Lab results reviewed.  Recent Imaging Review  MR LUMBAR SPINE WO CONTRAST CLINICAL DATA:  Osteoarthritis, thoracic Mid-back pain; Low back pain, symptoms persist with > 6 wks treatment Lumbar radiculopathy, symptoms persist with > 6 wks treatment  EXAM: MRI THORACIC AND LUMBAR SPINE WITHOUT CONTRAST  TECHNIQUE: Multiplanar and multiecho pulse sequences of the thoracic and lumbar spine were obtained without intravenous contrast.  COMPARISON:  None Available.  FINDINGS: MRI THORACIC SPINE FINDINGS  Alignment:  No significant listhesis.  Vertebrae: Vertebral body heights are maintained. There is minor degenerative endplate irregularity. No marrow edema. No suspicious osseous lesion.  Cord:  No abnormal signal.  Paraspinal and other soft tissues: Unremarkable.  Disc levels: No significant disc space narrowing. No disc herniation. No significant canal or foraminal stenosis.  MRI LUMBAR SPINE FINDINGS  Segmentation:  Standard.  Alignment:  No significant listhesis.  Vertebrae: Vertebral body heights are maintained. There is no marrow edema. No suspicious osseous lesion.  Conus medullaris and cauda equina: Conus extends to the L1-L2 level. Conus and cauda equina appear normal.  Paraspinal and other soft tissues: Unremarkable.  Disc levels: No significant disc space narrowing. No disc herniation. No significant canal or foraminal stenosis. Minor facet arthropathy at L5-S1.  IMPRESSION: No disc herniation or significant canal or foraminal narrowing.  Electronically Signed   By: Macy Mis M.D.   On: 10/01/2021 12:10 MR THORACIC SPINE WO CONTRAST CLINICAL DATA:  Osteoarthritis, thoracic Mid-back pain; Low back pain, symptoms persist with > 6 wks treatment Lumbar radiculopathy, symptoms persist with > 6 wks treatment  EXAM: MRI  THORACIC AND  LUMBAR SPINE WITHOUT CONTRAST  TECHNIQUE: Multiplanar and multiecho pulse sequences of the thoracic and lumbar spine were obtained without intravenous contrast.  COMPARISON:  None Available.  FINDINGS: MRI THORACIC SPINE FINDINGS  Alignment:  No significant listhesis.  Vertebrae: Vertebral body heights are maintained. There is minor degenerative endplate irregularity. No marrow edema. No suspicious osseous lesion.  Cord:  No abnormal signal.  Paraspinal and other soft tissues: Unremarkable.  Disc levels: No significant disc space narrowing. No disc herniation. No significant canal or foraminal stenosis.  MRI LUMBAR SPINE FINDINGS  Segmentation:  Standard.  Alignment:  No significant listhesis.  Vertebrae: Vertebral body heights are maintained. There is no marrow edema. No suspicious osseous lesion.  Conus medullaris and cauda equina: Conus extends to the L1-L2 level. Conus and cauda equina appear normal.  Paraspinal and other soft tissues: Unremarkable.  Disc levels: No significant disc space narrowing. No disc herniation. No significant canal or foraminal stenosis. Minor facet arthropathy at L5-S1.  IMPRESSION: No disc herniation or significant canal or foraminal narrowing.  Electronically Signed   By: Macy Mis M.D.   On: 10/01/2021 12:10 Note: Reviewed        Physical Exam  General appearance: Well nourished, well developed, and well hydrated. In no apparent acute distress Mental status: Alert, oriented x 3 (person, place, & time)       Respiratory: No evidence of acute respiratory distress Eyes: PERLA Vitals: BP (!) 154/74 (BP Location: Right Arm, Patient Position: Sitting, Cuff Size: Large)   Pulse 85   Temp 98.2 F (36.8 C) (Temporal)   Resp 16   Ht _0  (1.727 m)   Wt 236 lb (107 kg)   SpO2 98%   BMI 35.88 kg/m  BMI: Estimated body mass index is 35.88 kg/m as calculated from the following:   Height as of this encounter: 5'  8" (1.727 m).   Weight as of this encounter: 236 lb (107 kg). Ideal: Ideal body weight: 68.4 kg (150 lb 12.7 oz) Adjusted ideal body weight: 83.9 kg (184 lb 14 oz)  Cervicalgia  Right shoulder pain, worse with shoulder abduction and external rotation  Low back pain with radiation into left lateral thigh, proximal to knee  4+ out of 5 strength bilateral upper extremity: Shoulder abduction, elbow flexion, elbow extension, thumb extension.  5 out of 5 strength bilateral lower extremity: Plantar flexion, dorsiflexion, knee flexion, knee extension.   Assessment   Diagnosis Status  1. Lumbar radicular pain (left L3/4)   2. 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 Cervical radicular pain   4. 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 Rupture of right supraspinatus tendon, sequela   6. Neuropathy of right suprascapular nerve   7. 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 Lumbar radicular pain (left L3/4) - Lumbar Epidural Injection; Future  2. 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 Cervical radicular pain - Cervical Epidural Injection; Future  4. 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 Rupture of right supraspinatus tendon, sequela - SUPRASCAPULAR NERVE BLOCK; Future  6. Neuropathy of right suprascapular nerve -  SUPRASCAPULAR NERVE BLOCK; Future  7. Chronic right shoulder pain - SUPRASCAPULAR NERVE BLOCK; Future  Orders:  Orders Placed This Encounter  Procedures   Lumbar Epidural Injection    Standing Status:   Future    Standing Expiration Date:   07/24/2022     Scheduling Instructions:     Procedure: Interlaminar Lumbar Epidural Steroid injection (LESI)            Laterality: LEFT L3/4     Sedation: IV Versed     Timeframe: ASAA    Order Specific Question:   Where will this procedure be performed?    Answer:   [facility]   Cervical Epidural Injection    Sedation: IV Versed Purpose: Diagnostic/Therapeutic Indication(s): Radiculitis and cervicalgia associater with cervical degenerative disc disease.    Standing Status:   Future    Standing Expiration Date:   07/24/2022    Scheduling Instructions:     Procedure: Cervical Epidural Steroid Injection/Block     Level(s): C7-T1     Laterality: TBD     Timeframe: As soon as schedule allows    Order Specific Question:   Where will this procedure be performed?    Answer:   [facility]    Comments:   [facility]   SUPRASCAPULAR NERVE BLOCK    For shoulder pain.    Standing Status:   Future    Standing Expiration Date:   07/24/2022    Scheduling Instructions:     Laterality: RIGHT     Level(s): Suprascapular notch     Sedation: IV Versed     Scheduling Timeframe: As permitted by the schedule    Order Specific Question:   Where will this procedure be performed?    Answer:   [facility]   Follow-up plan:   Return in about 11 days (around 05/05/2022) for Left L3/4 ESI, in clinic IV Versed.     B/L IA Knee steroid 12/12/19, 07/29/21: Helpful,  T10, 11, 12 MBB on 12/19/2019 followed by RFA (previosly done with Dr Maryjean Ka in 2018), status post bilateral T10, T11, T12 thoracic facet medial branch nerve block 8/23/ 2021 which provided 75% pain relief for 3-1/2 weeks with gradual return of pain thereafter.  Right T10, T11, T12 RFA on 01/30/2020; left T10, T11, T12 RFA on 02/20/2020, 12/05/20, 07/29/20     Recent Visits Date Type Provider Dept  04/14/22 Office Visit Gillis Santa, MD [facility]  Showing recent visits within past 90 days and meeting all other requirements Today's  Visits Date Type Provider Dept  04/24/22 Office Visit Gillis Santa, MD [facility]  Showing today's visits and meeting all other requirements Future Appointments Date Type Provider Dept  05/05/22 Appointment Gillis Santa, MD [facility]  05/26/22 Appointment Gillis Santa, MD [facility]  Showing future appointments within next 90 days and meeting all other requirements  I discussed the assessment and treatment plan with the patient. The patient was provided an opportunity to ask questions and all were answered. The patient agreed with the plan and demonstrated an understanding of the instructions.  Patient advised to call back or seek an in-person evaluation if the symptoms or condition worsens.  Duration of encounter: 72mnutes.  Total time on encounter, as per AMA guidelines included both the face-to-face and non-face-to-face time personally spent by the physician and/or other qualified health care professional(s) on the day of the encounter (includes time in activities that require the physician or other qualified health  care professional and does not include time in activities normally performed by clinical staff). Physician's time may include the following activities when performed: Preparing to see the patient (e.g., pre-charting review of records, searching for previously ordered imaging, lab work, and nerve conduction tests) Review of prior analgesic pharmacotherapies. Reviewing PMP Interpreting ordered tests (e.g., lab work, imaging, nerve conduction tests) Performing post-procedure evaluations, including interpretation of diagnostic procedures Obtaining and/or reviewing separately obtained history Performing a medically appropriate examination and/or evaluation Counseling and educating the patient/family/caregiver Ordering medications, tests, or procedures Referring and communicating with other health care professionals (when not separately  reported) Documenting clinical information in the electronic or other health record Independently interpreting results (not separately reported) and communicating results to the patient/ family/caregiver Care coordination (not separately reported)  Note by: Gillis Santa, MD Date: 04/24/2022; Time: 11:00 AM

## 2022-04-24 NOTE — Patient Instructions (Addendum)
We will start with LEFT L3/4 ESI with IV Versed in 1-2 weeks  2. Afterwards (2-3 weeks after your lumbar ESI) we will do C-ESI and RIGHT suprascapular nerve block with IV Versed  Please give patient 2 procedure appts, separated by 2-3 weeks   ______________________________________________________________________  Preparing for your procedure  During your procedure appointment there will be: No Prescription Refills. No disability issues to discussed. No medication changes or discussions.  Instructions: Food intake: Avoid eating anything solid for at least 8 hours prior to your procedure. Clear liquid intake: You may take clear liquids such as water up to 2 hours prior to your procedure. (No carbonated drinks. No soda.) Transportation: Unless otherwise stated by your physician, bring a driver. Morning Medicines: Except for blood thinners, take all of your other morning medications with a sip of water. Make sure to take your heart and blood pressure medicines. If your blood pressure's lower number is above 100, the case will be rescheduled. Blood thinners: If you take a blood thinner, but were not instructed to stop it, call our office [phone] and ask to talk to a nurse. Not stopping a blood thinner prior to certain procedures could lead to serious complications. Diabetics on insulin: Notify the staff so that you can be scheduled 1st case in the morning. If your diabetes requires high dose insulin, take only  of your normal insulin dose the morning of the procedure and notify the staff that you have done so. Preventing infections: Shower with an antibacterial soap the morning of your procedure.  Build-up your immune system: Take 1000 mg of Vitamin C with every meal (3 times a day) the day prior to your procedure. Antibiotics: Inform the nursing staff if you are taking any antibiotics or if you have any conditions that may require antibiotics prior to procedures. (Example: recent joint  implants)   Pregnancy: If you are pregnant make sure to notify the nursing staff. Not doing so may result in injury to the fetus, including death.  Sickness: If you have a cold, fever, or any active infections, call and cancel or reschedule your procedure. Receiving steroids while having an infection may result in complications. Arrival: You must be in the facility at least 30 minutes prior to your scheduled procedure. Tardiness: Your scheduled time is also the cutoff time. If you do not arrive at least 15 minutes prior to your procedure, you will be rescheduled.  Children: Do not bring any children with you. Make arrangements to keep them home. Dress appropriately: There is always a possibility that your clothing may get soiled. Avoid long dresses. Valuables: Do not bring any jewelry or valuables.  Reasons to call and reschedule or cancel your procedure: (Following these recommendations will minimize the risk of a serious complication.) Surgeries: Avoid having procedures within 2 weeks of any surgery. (Avoid for 2 weeks before or after any surgery). Flu Shots: Avoid having procedures within 2 weeks of a flu shots or . (Avoid for 2 weeks before or after immunizations). Barium: Avoid having a procedure within 7-10 days after having had a radiological study involving the use of radiological contrast. (Myelograms, Barium swallow or enema study). Heart attacks: Avoid any elective procedures or surgeries for the initial 6 months after a "Myocardial Infarction" (Heart Attack). Blood thinners: It is imperative that you stop these medications before procedures. Let us know if you if you take any blood thinner.  Infection: Avoid procedures during or within two weeks of an infection (including chest colds  or gastrointestinal problems). Symptoms associated with infections include: Localized redness, fever, chills, night sweats or profuse sweating, burning sensation when voiding, cough, congestion, stuffiness,  runny nose, sore throat, diarrhea, nausea, vomiting, cold or Flu symptoms, recent or current infections. It is specially important if the infection is over the area that we intend to treat. Heart and lung problems: Symptoms that may suggest an active cardiopulmonary problem include: cough, chest pain, breathing difficulties or shortness of breath, dizziness, ankle swelling, uncontrolled high or unusually low blood pressure, and/or palpitations. If you are experiencing any of these symptoms, cancel your procedure and contact your primary care physician for an evaluation.  Remember:  Regular Business hours are:  Monday to Thursday 8:00 AM to 4:00 PM  Provider's Schedule: Milinda Pointer, MD:  Procedure days: Tuesday and Thursday 7:30 AM to 4:00 PM  Gillis Santa, MD:  Procedure days: Monday and Wednesday 7:30 AM to 4:00 PM  ______________________________________________________________________

## 2022-05-05 ENCOUNTER — Ambulatory Visit
Admission: RE | Admit: 2022-05-05 | Discharge: 2022-05-05 | Disposition: A | Discharge status: Home / Self Care | Payer: No Typology Code available for payment source | Source: Ambulatory Visit | Attending: Student in an Organized Health Care Education/Training Program | Admitting: Student in an Organized Health Care Education/Training Program

## 2022-05-05 ENCOUNTER — Ambulatory Visit
Payer: No Typology Code available for payment source | Attending: Student in an Organized Health Care Education/Training Program | Admitting: Student in an Organized Health Care Education/Training Program

## 2022-05-05 ENCOUNTER — Encounter: Payer: Self-pay | Admitting: Student in an Organized Health Care Education/Training Program

## 2022-05-05 VITALS — BP 126/77 | HR 76 | Temp 98.2°F | Resp 16 | Ht 68.0 in | Wt 236.0 lb

## 2022-05-05 DIAGNOSIS — M5416 Radiculopathy, lumbar region: Secondary | ICD-10-CM

## 2022-05-05 DIAGNOSIS — M5126 Other intervertebral disc displacement, lumbar region: Secondary | ICD-10-CM

## 2022-05-05 MED ORDER — LIDOCAINE HCL 2 % IJ SOLN
20.0000 mL | Freq: Once | INTRAMUSCULAR | Status: AC
  Administered 2022-05-05: 100 mg

## 2022-05-05 MED ORDER — LACTATED RINGERS IV SOLN
Freq: Once | INTRAVENOUS | Status: AC
Start: 2022-05-05 — End: 2022-05-05

## 2022-05-05 MED ORDER — SODIUM CHLORIDE (PF) 0.9 % IJ SOLN
INTRAMUSCULAR | Status: AC
  Filled 2022-05-05: qty 10

## 2022-05-05 MED ORDER — MIDAZOLAM HCL 5 MG/5ML IJ SOLN
0.5000 mg | Freq: Once | INTRAMUSCULAR | Status: AC
  Administered 2022-05-05: 2 mg via INTRAVENOUS

## 2022-05-05 MED ORDER — IOHEXOL 180 MG/ML  SOLN
INTRAMUSCULAR | Status: AC
  Filled 2022-05-05: qty 20

## 2022-05-05 MED ORDER — IOHEXOL 180 MG/ML  SOLN
10.0000 mL | Freq: Once | INTRAMUSCULAR | Status: AC
  Administered 2022-05-05: 10 mL via EPIDURAL

## 2022-05-05 MED ORDER — SODIUM CHLORIDE 0.9% FLUSH
2.0000 mL | Freq: Once | INTRAVENOUS | Status: AC
  Administered 2022-05-05: 2 mL

## 2022-05-05 MED ORDER — ROPIVACAINE HCL 2 MG/ML IJ SOLN
2.0000 mL | Freq: Once | INTRAMUSCULAR | Status: AC
  Administered 2022-05-05: 2 mL via EPIDURAL

## 2022-05-05 MED ORDER — DEXAMETHASONE SODIUM PHOSPHATE 10 MG/ML IJ SOLN
INTRAMUSCULAR | Status: AC
  Filled 2022-05-05: qty 1

## 2022-05-05 MED ORDER — LIDOCAINE HCL (PF) 2 % IJ SOLN
INTRAMUSCULAR | Status: AC
  Filled 2022-05-05: qty 10

## 2022-05-05 MED ORDER — MIDAZOLAM HCL 2 MG/2ML IJ SOLN
INTRAMUSCULAR | Status: AC
  Filled 2022-05-05: qty 2

## 2022-05-05 MED ORDER — ROPIVACAINE HCL 2 MG/ML IJ SOLN
INTRAMUSCULAR | Status: AC
  Filled 2022-05-05: qty 20

## 2022-05-05 MED ORDER — DEXAMETHASONE SODIUM PHOSPHATE 10 MG/ML IJ SOLN
10.0000 mg | Freq: Once | INTRAMUSCULAR | Status: AC
Start: 2022-05-05 — End: 2022-05-05
  Administered 2022-05-05: 10 mg

## 2022-05-05 NOTE — Patient Instructions (Signed)
Pain Management Discharge Instructions  General Discharge Instructions :  If you need to reach your doctor call: Monday-Friday 8:00 am - 4:00 pm at [phone] or toll free [phone].  After clinic hours [phone] to have operator reach doctor.  Bring all of your medication bottles to all your appointments in the pain clinic.  To cancel or reschedule your appointment with Pain Management please remember to call 24 hours in advance to avoid a fee.  Refer to the educational materials which you have been given on: General Risks, I had my Procedure. Discharge Instructions, Post Sedation.  Post Procedure Instructions:  The drugs you were given will stay in your system until tomorrow, so for the next 24 hours you should not drive, make any legal decisions or drink any alcoholic beverages.  You may eat anything you prefer, but it is better to start with liquids then soups and crackers, and gradually work up to solid foods.  Please notify your doctor immediately if you have any unusual bleeding, trouble breathing or pain that is not related to your normal pain.  Depending on the type of procedure that was done, some parts of your body may feel week and/or numb.  This usually clears up by tonight or the next day.  Walk with the use of an assistive device or accompanied by an adult for the 24 hours.  You may use ice on the affected area for the first 24 hours.  Put ice in a Ziploc bag and cover with a towel and place against area 15 minutes on 15 minutes off.  You may switch to heat after 24 hours.Epidural Steroid Injection Patient Information  Description: The epidural space surrounds the nerves as they exit the spinal cord.  In some patients, the nerves can be compressed and inflamed by a bulging disc or a tight spinal canal (spinal stenosis).  By injecting steroids into the epidural space, we can bring irritated nerves into direct contact with a potentially helpful medication.  These  steroids act directly on the irritated nerves and can reduce swelling and inflammation which often leads to decreased pain.  Epidural steroids may be injected anywhere along the spine and from the neck to the low back depending upon the location of your pain.   After numbing the skin with local anesthetic (like Novocaine), a small needle is passed into the epidural space slowly.  You may experience a sensation of pressure while this is being done.  The entire block usually last less than 10 minutes.  Conditions which may be treated by epidural steroids:  Low back and leg pain Neck and arm pain Spinal stenosis Post-laminectomy syndrome Herpes zoster (shingles) pain Pain from compression fractures  Preparation for the injection:  Do not eat any solid food or dairy products within 8 hours of your appointment.  You may drink clear liquids up to 3 hours before appointment.  Clear liquids include water, black coffee, juice or soda.  No milk or cream please. You may take your regular medication, including pain medications, with a sip of water before your appointment  Diabetics should hold regular insulin (if taken separately) and take 1/2 normal NPH dos the morning of the procedure.  Carry some sugar containing items with you to your appointment. A driver must accompany you and be prepared to drive you home after your procedure.  Bring all your current medications with your. An IV may be inserted and sedation may be given at the discretion of the physician.     A blood pressure cuff, EKG and other monitors will often be applied during the procedure.  Some patients may need to have extra oxygen administered for a short period. You will be asked to provide medical information, including your allergies, prior to the procedure.  We must know immediately if you are taking blood thinners (like Coumadin/Warfarin)  Or if you are allergic to IV iodine contrast (dye). We must know if you could possible be  pregnant.  Possible side-effects: Bleeding from needle site Infection (rare, may require surgery) Nerve injury (rare) Numbness & tingling (temporary) Difficulty urinating (rare, temporary) Spinal headache ( a headache worse with upright posture) Light -headedness (temporary) Pain at injection site (several days) Decreased blood pressure (temporary) Weakness in arm/leg (temporary) Pressure sensation in back/neck (temporary)  Call if you experience: Fever/chills associated with headache or increased back/neck pain. Headache worsened by an upright position. New onset weakness or numbness of an extremity below the injection site Hives or difficulty breathing (go to the emergency room) Inflammation or drainage at the infection site Severe back/neck pain Any new symptoms which are concerning to you  Please note:  Although the local anesthetic injected can often make your back or neck feel good for several hours after the injection, the pain will likely return.  It takes 3-7 days for steroids to work in the epidural space.  You may not notice any pain relief for at least that one week.  If effective, we will often do a series of three injections spaced 3-6 weeks apart to maximally decrease your pain.  After the initial series, we generally will wait several months before considering a repeat injection of the same type.  If you have any questions, please call [phone] [facility] 

## 2022-05-05 NOTE — Progress Notes (Signed)
PROVIDER NOTE: Interpretation of information contained herein should be left to medically-trained personnel. Specific patient instructions are provided elsewhere under "Patient Instructions" section of medical record. This document was created in part using STT-dictation technology, any transcriptional errors that may result from this process are unintentional.  Patient: Evan Cherry Type: Established DOB: 1965-10-31 MRN: [id number] PCP: Gehrig Giovanni, MD  Service: Procedure DOS: 05/05/2022 Setting: Ambulatory Location: Ambulatory outpatient facility Delivery: Face-to-face Provider: Gillis Santa, MD Specialty: Interventional Pain Management Specialty designation: 09 Location: Outpatient facility Ref. Prov.: Umar Giovanni, MD    Primary Reason for Visit: Interventional Pain Management Treatment. CC: Back Pain (Mid and lower)   Procedure:           Type: Lumbar epidural steroid injection (LESI) (interlaminar) #1    Laterality: Left   Level:  L4-5 Level.  Imaging: Fluoroscopic guidance         Anesthesia: Local anesthesia (1-2% Lidocaine) Anxiolysis: IV Versed         DOS: 05/05/2022  Performed by: Gillis Santa, MD  Purpose: Diagnostic/Therapeutic Indications: Lumbar radicular pain of intraspinal etiology of more than 4 weeks that has failed to respond to conservative therapy and is severe enough to impact quality of life or function. 1. Lumbar radicular pain (left L3/4)   2. 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  Prep solution: DuraPrep (Iodine Povacrylex [0.7% available iodine] and Isopropyl Alcohol, 74% w/w) Prep Area: Entire Posterior Lumbar Region from lower scapular tip down to mid buttocks area and from flank to flank. Materials:  Tray: Epidural tray Needle(s):   Type: Epidural needle (Tuohy) Gauge (G):  22 Length: Regular (3.5-in) Qty: 1  Pre-op H&P Assessment:  [name] is a 57 y.o. (year old), male patient, seen today for interventional treatment. He  has no past surgical history on file. [name] has a current medication list which includes the following prescription(s): acetaminophen, albuterol, albuterol, azelastine, benazepril, benazepril-hydrochlorthiazide, budesonide-formoterol, ciclesonide, cyanocobalamin, cyclobenzaprine, diclofenac sodium, dupilumab, epinephrine, guaifenesin, hydrochlorothiazide, lidocaine, morphine, naproxen, testosterone cypionate, tiotropium bromide monohydrate, tramadol, fasenra, carboxymethylcellulose sodium, cholecalciferol, cyanocobalamin, doxycycline, hydrocodone-acetaminophen, hydrocodone-acetaminophen, loratadine, meloxicam, multiple vitamin, naproxen sodium, pravastatin, prednisone, tiotropium, and tizanidine, and the following Facility-Administered Medications: lactated ringers and midazolam. His primarily concern today is the Back Pain (Mid and lower)  Initial Vital Signs:  Pulse/HCG Rate: 76ECG Heart Rate: 76 Temp: 98.2 F (36.8 C) Resp: 18 BP: 136/70 SpO2: 100 %  BMI: Estimated body mass index is 35.88 kg/m as calculated from the following:   Height as of this encounter: '5\' 8"'$  (1.727 m).   Weight as of this encounter: 236 lb (107 kg).  Risk Assessment: Allergies: Reviewed. He has No Known Allergies.  Allergy Precautions: None required Coagulopathies: Reviewed. None identified.  Blood-thinner therapy: None at this time Active Infection(s): Reviewed. None identified. [name] is afebrile  Site Confirmation: [name] was asked to confirm the procedure and laterality before marking the site Procedure checklist: Completed Consent: Before the procedure and under the influence of no sedative(s), amnesic(s), or anxiolytics, the patient was informed of the treatment options, risks and possible complications.  To fulfill our ethical and legal obligations, as recommended by the American Medical Association's Code of Ethics, I have informed the patient of my clinical impression; the nature and purpose  of the treatment or procedure; the risks, benefits, and possible complications of the intervention; the alternatives, including doing nothing; the risk(s) and benefit(s) of the alternative treatment(s) or procedure(s); and the risk(s) and benefit(s) of doing nothing. The patient was provided information about the general risks and possible complications associated with the procedure. These may include, but are not limited to: failure to achieve desired goals, infection, bleeding, organ or nerve damage, allergic reactions, paralysis, and death. In addition, the patient was informed of those risks and complications associated to Spine-related procedures, such as failure to decrease pain; infection (i.e.: Meningitis, epidural or intraspinal abscess); bleeding (i.e.: epidural hematoma, subarachnoid hemorrhage, or any other type of intraspinal or peri-dural bleeding); organ or nerve damage (i.e.: Any type of peripheral nerve, nerve root, or spinal cord injury) with subsequent damage to sensory, motor, and/or autonomic systems, resulting in permanent pain, numbness, and/or weakness of one or several areas of the body; allergic reactions; (i.e.: anaphylactic reaction); and/or death. Furthermore, the patient was informed of those risks and complications associated with the medications. These include, but are not limited to: allergic reactions (i.e.: anaphylactic or anaphylactoid reaction(s)); adrenal axis suppression; blood sugar elevation that in diabetics may result in ketoacidosis or comma; water retention that in patients with history of congestive heart failure may result in shortness of breath, pulmonary edema, and decompensation with resultant heart failure; weight gain; swelling or edema; medication-induced neural toxicity;  particulate matter embolism and blood vessel occlusion with resultant organ, and/or nervous system infarction; and/or aseptic necrosis of one or more joints. Finally, the patient was informed that Medicine is not an exact science; therefore, there is also the possibility of unforeseen or unpredictable risks and/or possible complications that may result in a catastrophic outcome. The patient indicated having understood very clearly. We have given the patient no guarantees and we have made no promises. Enough time was given to the patient to ask questions, all of which were answered to the patient's satisfaction. Mr. Clairmont has indicated that he wanted to continue with the procedure. Attestation: I, the ordering provider, attest that I have discussed with the patient the benefits, risks, side-effects, alternatives, likelihood of achieving goals, and potential problems during recovery for the procedure that I have provided informed consent. Date  Time: 05/05/2022  9:59 AM  Pre-Procedure Preparation:  Monitoring: As per clinic protocol. Respiration, ETCO2, SpO2, BP, heart rate and rhythm monitor placed and checked for adequate function Safety Precautions: Patient was assessed for positional comfort and pressure points before starting the procedure. Time-out: I initiated and conducted the "Time-out" before starting the procedure, as per protocol. The patient was asked to participate by confirming the accuracy of the "Time Out" information. Verification of the correct person, site, and procedure were performed and confirmed by me, the nursing staff, and the patient. "Time-out" conducted as per Joint Commission's Universal Protocol (UP.01.01.01). Time: 1036  Description/Narrative of Procedure:          Target: Epidural space via interlaminar opening, initially targeting the lower laminar border of the superior vertebral body. Region: Lumbar Approach: Percutaneous paravertebral  Rationale (medical necessity):  procedure needed and proper for the diagnosis and/or treatment of the patient's medical symptoms and needs. Procedural Technique Safety Precautions: Aspiration looking for blood return was conducted prior to all injections. At no point did we inject any substances, as a needle was being advanced. No attempts were made at seeking any paresthesias. Safe injection practices and needle disposal techniques used. Medications properly checked for expiration dates. SDV (  single dose vial) medications used. Description of the Procedure: Protocol guidelines were followed. The procedure needle was introduced through the skin, ipsilateral to the reported pain, and advanced to the target area. Bone was contacted and the needle walked caudad, until the lamina was cleared. The epidural space was identified using "loss-of-resistance technique" with 2-3 ml of PF-NaCl (0.9% NSS), in a 5cc LOR glass syringe.  6 cc solution made of 3 cc of preservative-free saline, 2 cc of 0.2% ropivacaine, 1 cc of Decadron 10 mg/cc.   Vitals:   05/05/22 1041 05/05/22 1046 05/05/22 1049 05/05/22 1058  BP: 122/64 136/85 136/65 126/77  Pulse:      Resp: '16 18 16 16  '$ Temp:      TempSrc:      SpO2: 100% 96% 99% 96%  Weight:      Height:        Start Time: 1036 hrs. End Time: 1049 hrs.  Imaging Guidance (Spinal):          Type of Imaging Technique: Fluoroscopy Guidance (Spinal) Indication(s): Assistance in needle guidance and placement for procedures requiring needle placement in or near specific anatomical locations not easily accessible without such assistance. Exposure Time: Please see nurses notes. Contrast: Before injecting any contrast, we confirmed that the patient did not have an allergy to iodine, shellfish, or radiological contrast. Once satisfactory needle placement was completed at the desired level, radiological contrast was injected. Contrast injected under live fluoroscopy. No contrast complications. See chart for  type and volume of contrast used. Fluoroscopic Guidance: I was personally present during the use of fluoroscopy. "Tunnel Vision Technique" used to obtain the best possible view of the target area. Parallax error corrected before commencing the procedure. "Direction-depth-direction" technique used to introduce the needle under continuous pulsed fluoroscopy. Once target was reached, antero-posterior, oblique, and lateral fluoroscopic projection used confirm needle placement in all planes. Images permanently stored in EMR. Interpretation: I personally interpreted the imaging intraoperatively. Adequate needle placement confirmed in multiple planes. Appropriate spread of contrast into desired area was observed. No evidence of afferent or efferent intravascular uptake. No intrathecal or subarachnoid spread observed. Permanent images saved into the patient's record.  Antibiotic Prophylaxis:   Anti-infectives (From admission, onward)    None      Indication(s): None identified  Post-operative Assessment:  Post-procedure Vital Signs:  Pulse/HCG Rate: 7679 Temp: 98.2 F (36.8 C) Resp: 16 BP: 126/77 SpO2: 96 %  EBL: None  Complications: No immediate post-treatment complications observed by team, or reported by patient.  Note: The patient tolerated the entire procedure well. A repeat set of vitals were taken after the procedure and the patient was kept under observation following institutional policy, for this type of procedure. Post-procedural neurological assessment was performed, showing return to baseline, prior to discharge. The patient was provided with post-procedure discharge instructions, including a section on how to identify potential problems. Should any problems arise concerning this procedure, the patient was given instructions to immediately contact us, at any time, without hesitation. In any case, we plan to contact the patient by telephone for a follow-up status report regarding this  interventional procedure.  Comments:  No additional relevant information.  Plan of Care  Orders:  Orders Placed This Encounter  Procedures   DG PAIN CLINIC C-ARM 1-60 MIN NO REPORT    Intraoperative interpretation by procedural physician at [place].    Standing Status:   Standing    Number of Occurrences:   1    Order  Specific Question:   Reason for exam:    Answer:   Assistance in needle guidance and placement for procedures requiring needle placement in or near specific anatomical locations not easily accessible without such assistance.    Medications ordered for procedure: Meds ordered this encounter  Medications   iohexol (OMNIPAQUE) 180 MG/ML injection 10 mL    Must be Myelogram-compatible. If not available, you may substitute with a water-soluble, non-ionic, hypoallergenic, myelogram-compatible radiological contrast medium.   lidocaine (XYLOCAINE) 2 % (with pres) injection 400 mg   sodium chloride flush (NS) 0.9 % injection 2 mL   ropivacaine (PF) 2 mg/mL (0.2%) (NAROPIN) injection 2 mL   dexamethasone (DECADRON) injection 10 mg   lactated ringers infusion   midazolam (VERSED) 5 MG/5ML injection 0.5-2 mg    Make sure Flumazenil is available in the pyxis when using this medication. If oversedation occurs, administer 0.2 mg IV over 15 sec. If after 45 sec no response, administer 0.2 mg again over 1 min; may repeat at 1 min intervals; not to exceed 4 doses (1 mg)   Medications administered: We administered iohexol, lidocaine, sodium chloride flush, ropivacaine (PF) 2 mg/mL (0.2%), and dexamethasone.  See the medical record for exact dosing, route, and time of administration.  Follow-up plan:   Return in about 4 weeks (around 06/02/2022) for Post Procedure Evaluation, in person.       B/L IA Knee steroid 12/12/19, 07/29/21: Helpful,  T10, 11, 12 MBB on 12/19/2019 followed by RFA (previosly done with Dr Maryjean Ka in 2018), status post bilateral T10, T11, T12 thoracic facet  medial branch nerve block 8/23/ 2021 which provided 75% pain relief for 3-1/2 weeks with gradual return of pain thereafter.  Right T10, T11, T12 RFA on 01/30/2020; left T10, T11, T12 RFA on 02/20/2020, 12/05/20, 07/29/20, Left L4/5 ESI 05/05/22      Recent Visits Date Type Provider Dept  04/24/22 Office Visit Gillis Santa, MD [facility]  04/14/22 Office Visit Gillis Santa, MD [facility]  Showing recent visits within past 90 days and meeting all other requirements Today's Visits Date Type Provider Dept  05/05/22 Procedure visit Gillis Santa, MD [facility]  Showing today's visits and meeting all other requirements Future Appointments Date Type Provider Dept  05/26/22 Appointment Gillis Santa, MD [facility]  Showing future appointments within next 90 days and meeting all other requirements  Disposition: Discharge home  Discharge (Date  Time): 05/05/2022; 1059 hrs.   Primary Care Physician: Khalen Giovanni, MD Location: [facility] Note by: Gillis Santa, MD Date: 05/05/2022; Time: 11:02 AM  Disclaimer:  Medicine is not an exact science. The only guarantee in medicine is that nothing is guaranteed. It is important to note that the decision to proceed with this intervention was based on the information collected from the patient. The Data and conclusions were drawn from the patient's questionnaire, the interview, and the physical examination. Because the information was provided in large part by the patient, it cannot be guaranteed that it has not been purposely or unconsciously manipulated. Every effort has been made to obtain as much relevant data as possible for this evaluation. It is important to note that the conclusions that lead to this procedure are derived in large part from the available data. Always take into account that the treatment will also be dependent on availability of resources and existing treatment  guidelines, considered by other Pain Management Practitioners as being common knowledge and practice, at the  time of the intervention. For Medico-Legal purposes, it is also important to point out that variation in procedural techniques and pharmacological choices are the acceptable norm. The indications, contraindications, technique, and results of the above procedure should only be interpreted and judged by a Board-Certified Interventional Pain Specialist with extensive familiarity and expertise in the same exact procedure and technique.

## 2022-05-05 NOTE — Progress Notes (Signed)
Safety precautions to be maintained throughout the outpatient stay will include: orient to surroundings, keep bed in low position, maintain call bell within reach at all times, provide assistance with transfer out of bed and ambulation.  

## 2022-05-06 ENCOUNTER — Telehealth: Payer: Self-pay

## 2022-05-06 NOTE — Telephone Encounter (Signed)
Post procedure follow up..  Patient states he is doing good 

## 2022-05-26 ENCOUNTER — Ambulatory Visit
Payer: No Typology Code available for payment source | Attending: Student in an Organized Health Care Education/Training Program | Admitting: Student in an Organized Health Care Education/Training Program

## 2022-05-26 ENCOUNTER — Ambulatory Visit
Admission: RE | Admit: 2022-05-26 | Discharge: 2022-05-26 | Disposition: A | Discharge status: Home / Self Care | Payer: No Typology Code available for payment source | Source: Ambulatory Visit | Attending: Student in an Organized Health Care Education/Training Program | Admitting: Student in an Organized Health Care Education/Training Program

## 2022-05-26 ENCOUNTER — Encounter: Payer: Self-pay | Admitting: Student in an Organized Health Care Education/Training Program

## 2022-05-26 VITALS — BP 130/60 | HR 78 | Temp 97.2°F | Resp 16 | Ht 68.0 in | Wt 236.0 lb

## 2022-05-26 DIAGNOSIS — M5412 Radiculopathy, cervical region: Secondary | ICD-10-CM | POA: Diagnosis not present

## 2022-05-26 DIAGNOSIS — G5681 Other specified mononeuropathies of right upper limb: Secondary | ICD-10-CM | POA: Diagnosis not present

## 2022-05-26 DIAGNOSIS — M5020 Other cervical disc displacement, unspecified cervical region: Secondary | ICD-10-CM | POA: Diagnosis not present

## 2022-05-26 DIAGNOSIS — S46011S Strain of muscle(s) and tendon(s) of the rotator cuff of right shoulder, sequela: Secondary | ICD-10-CM | POA: Diagnosis not present

## 2022-05-26 MED ORDER — IOHEXOL 180 MG/ML  SOLN
10.0000 mL | Freq: Once | INTRAMUSCULAR | Status: AC
  Administered 2022-05-26: 10 mL via EPIDURAL
  Filled 2022-05-26: qty 20

## 2022-05-26 MED ORDER — ROPIVACAINE HCL 2 MG/ML IJ SOLN
2.0000 mL | Freq: Once | INTRAMUSCULAR | Status: AC
  Administered 2022-05-26: 2 mL via EPIDURAL
  Filled 2022-05-26: qty 20

## 2022-05-26 MED ORDER — SODIUM CHLORIDE 0.9% FLUSH
1.0000 mL | Freq: Once | INTRAVENOUS | Status: AC
  Administered 2022-05-26: 1 mL

## 2022-05-26 MED ORDER — LIDOCAINE HCL 2 % IJ SOLN
20.0000 mL | Freq: Once | INTRAMUSCULAR | Status: AC
  Administered 2022-05-26: 100 mg
  Filled 2022-05-26: qty 40

## 2022-05-26 MED ORDER — ROPIVACAINE HCL 2 MG/ML IJ SOLN
1.0000 mL | Freq: Once | INTRAMUSCULAR | Status: AC
  Administered 2022-05-26: 1 mL via EPIDURAL
  Filled 2022-05-26: qty 20

## 2022-05-26 MED ORDER — MIDAZOLAM HCL 2 MG/2ML IJ SOLN
0.5000 mg | Freq: Once | INTRAMUSCULAR | Status: AC
  Administered 2022-05-26: 2 mg via INTRAVENOUS
  Filled 2022-05-26: qty 2

## 2022-05-26 MED ORDER — METHYLPREDNISOLONE ACETATE 40 MG/ML IJ SUSP
40.0000 mg | Freq: Once | INTRAMUSCULAR | Status: AC
  Administered 2022-05-26: 40 mg via INTRA_ARTICULAR
  Filled 2022-05-26: qty 1

## 2022-05-26 MED ORDER — DEXAMETHASONE SODIUM PHOSPHATE 10 MG/ML IJ SOLN
10.0000 mg | Freq: Once | INTRAMUSCULAR | Status: AC
  Administered 2022-05-26: 10 mg
  Filled 2022-05-26: qty 1

## 2022-05-26 MED ORDER — LACTATED RINGERS IV SOLN: Freq: Once | INTRAVENOUS | Status: DC

## 2022-05-26 NOTE — Patient Instructions (Signed)

## 2022-05-26 NOTE — Progress Notes (Signed)
PROVIDER NOTE: Interpretation of information contained herein should be left to medically-trained personnel. Specific patient instructions are provided elsewhere under "Patient Instructions" section of medical record. This document was created in part using STT-dictation technology, any transcriptional errors that may result from this process are unintentional.  Patient: Evan Cherry Type: Established DOB: 07-Jan-1966 MRN: [id number] PCP: Cambren Giovanni, MD  Service: Procedure DOS: 05/26/2022 Setting: Ambulatory Location: Ambulatory outpatient facility Delivery: Face-to-face Provider: Gillis Santa, MD Specialty: Interventional Pain Management Specialty designation: 09 Location: Outpatient facility Ref. Prov.: Olegario Giovanni, MD    Primary Reason for Visit: Interventional Pain Management Treatment. CC: Neck Pain, Knee Pain (bilat), Back Pain, and Shoulder Pain (Right)  Procedure:           Type: Suprascapular nerve block (SSNB) #1  Laterality:  Right Level: Superior to scapular spine, lateral to supraspinatus fossa (Suprascapular notch).  Imaging: Fluoroscopic guidance         Anesthesia: Local anesthesia (1-2% Lidocaine) Sedation: Minimal Sedation                       DOS: 05/26/2022  Performed by: Gillis Santa, MD  Purpose: Diagnostic/Therapeutic Indications: Shoulder pain, severe enough to impact quality of life and/or function. 1. Cervical radicular pain   2. 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 Rupture of right supraspinatus tendon, sequela   4. Neuropathy of right suprascapular nerve    NAS-11 score:   Pre-procedure: 5 /10   Post-procedure: 0-No pain/10     Target: Suprascapular nerve Location: midway between the medial border of the scapula and the acromion as it runs through the suprascapular notch. Region: Suprascapular, posterior shoulder  Approach: Percutaneous  Neuroanatomy: The suprascapular nerve is the lateral branch of the superior trunk of the  brachial plexus. It receives nerve fibers that originate in the nerve roots C5 and C6 (and sometimes C4). It is a mixed nerve, meaning that it provides both sensory and motor supply for the suprascapular region. Function: The main function of this nerve is to provide motor innervation for two muscles, the supraspinatus and infraspinatus muscles. They are part of the rotator cuff muscles. In addition, the suprascapular nerve provides a sensory supply to the joints of the scapula (glenohumeral and acromioclavicular joints). Rationale (medical necessity): procedure needed and proper for the diagnosis and/or treatment of the patient's medical symptoms and needs.  Position / Prep / Materials:  Position: Prone Materials:  Tray: Block Needle(s):  Type: Spinal  Gauge (G): 22  Length: 3.5 in.  Qty: 1 Prep solution: DuraPrep (Iodine Povacrylex [0.7% available iodine] and Isopropyl Alcohol, 74% w/w) Prep Area: Entire posterior shoulder area. From upper spine to shoulder proper (upper arm), and from lateral neck to lower tip of shoulder blade.   Pre-op H&P Assessment:  Evan Cherry is a 57 y.o. (year old), male patient, seen today for interventional treatment. He  has no past surgical history on file. Evan Cherry has a current medication list which includes the following prescription(s): acetaminophen, albuterol, albuterol, azelastine, benazepril, benazepril-hydrochlorthiazide, budesonide-formoterol, ciclesonide, cyanocobalamin, cyclobenzaprine, diclofenac sodium, dupilumab, epinephrine, guaifenesin, hydrochlorothiazide, lidocaine, loratadine, morphine, naproxen, naproxen sodium, pravastatin, prednisone, testosterone cypionate, tiotropium bromide monohydrate, tramadol, fasenra, carboxymethylcellulose sodium, cholecalciferol, cyanocobalamin, doxycycline, hydrocodone-acetaminophen, hydrocodone-acetaminophen, meloxicam, multiple vitamin, tiotropium, and tizanidine, and the following Facility-Administered Medications:  lactated ringers. His primarily concern today is the Neck Pain, Knee Pain (bilat), Back Pain, and Shoulder Pain (Right)  Initial Vital Signs:  Pulse/HCG Rate: 78ECG Heart Rate: 76 Temp: (!) 97.2  F (36.2 C) Resp: 18 BP: 126/66 SpO2: 98 %  BMI: Estimated body mass index is 35.88 kg/m as calculated from the following:   Height as of this encounter: '5\' 8"'$  (1.727 m).   Weight as of this encounter: 236 lb (107 kg).  Risk Assessment: Allergies: Reviewed. He has No Known Allergies.  Allergy Precautions: None required Coagulopathies: Reviewed. None identified.  Blood-thinner therapy: None at this time Active Infection(s): Reviewed. None identified. Evan Cherry is afebrile  Site Confirmation: Evan Cherry was asked to confirm the procedure and laterality before marking the site Procedure checklist: Completed Consent: Before the procedure and under the influence of no sedative(s), amnesic(s), or anxiolytics, the patient was informed of the treatment options, risks and possible complications. To fulfill our ethical and legal obligations, as recommended by the American Medical Association's Code of Ethics, I have informed the patient of my clinical impression; the nature and purpose of the treatment or procedure; the risks, benefits, and possible complications of the intervention; the alternatives, including doing nothing; the risk(s) and benefit(s) of the alternative treatment(s) or procedure(s); and the risk(s) and benefit(s) of doing nothing. The patient was provided information about the general risks and possible complications associated with the procedure. These may include, but are not limited to: failure to achieve desired goals, infection, bleeding, organ or nerve damage, allergic reactions, paralysis, and death. In addition, the patient was informed of those risks and complications associated to the procedure, such as failure to decrease pain; infection; bleeding; organ or nerve damage with  subsequent damage to sensory, motor, and/or autonomic systems, resulting in permanent pain, numbness, and/or weakness of one or several areas of the body; allergic reactions; (i.e.: anaphylactic reaction); and/or death. Furthermore, the patient was informed of those risks and complications associated with the medications. These include, but are not limited to: allergic reactions (i.e.: anaphylactic or anaphylactoid reaction(s)); adrenal axis suppression; blood sugar elevation that in diabetics may result in ketoacidosis or comma; water retention that in patients with history of congestive heart failure may result in shortness of breath, pulmonary edema, and decompensation with resultant heart failure; weight gain; swelling or edema; medication-induced neural toxicity; particulate matter embolism and blood vessel occlusion with resultant organ, and/or nervous system infarction; and/or aseptic necrosis of one or more joints. Finally, the patient was informed that Medicine is not an exact science; therefore, there is also the possibility of unforeseen or unpredictable risks and/or possible complications that may result in a catastrophic outcome. The patient indicated having understood very clearly. We have given the patient no guarantees and we have made no promises. Enough time was given to the patient to ask questions, all of which were answered to the patient's satisfaction. Mr. Reason has indicated that he wanted to continue with the procedure. Attestation: I, the ordering provider, attest that I have discussed with the patient the benefits, risks, side-effects, alternatives, likelihood of achieving goals, and potential problems during recovery for the procedure that I have provided informed consent. Date  Time: 05/26/2022  9:13 AM  Pre-Procedure Preparation:  Monitoring: As per clinic protocol. Respiration, ETCO2, SpO2, BP, heart rate and rhythm monitor placed and checked for adequate function Safety  Precautions: Patient was assessed for positional comfort and pressure points before starting the procedure. Time-out: I initiated and conducted the "Time-out" before starting the procedure, as per protocol. The patient was asked to participate by confirming the accuracy of the "Time Out" information. Verification of the correct person, site, and procedure were performed and confirmed by  me, the nursing staff, and the patient. "Time-out" conducted as per Joint Commission's Universal Protocol (UP.01.01.01). Time: 1000  Description of Procedure:          Procedural Technique Safety Precautions: Aspiration looking for blood return was conducted prior to all injections. At no point did we inject any substances, as a needle was being advanced. No attempts were made at seeking any paresthesias. Safe injection practices and needle disposal techniques used. Medications properly checked for expiration dates. SDV (single dose vial) medications used. Description of the Procedure: Protocol guidelines were followed. The patient was placed in position over the procedure table. The target area was identified and the area prepped in the usual manner. Skin & deeper tissues infiltrated with local anesthetic. Appropriate amount of time allowed to pass for local anesthetics to take effect. The procedure needles were then advanced to the target area. Proper needle placement secured. Negative aspiration confirmed. Solution injected in intermittent fashion, asking for systemic symptoms every 0.5cc of injectate. The needles were then removed and the area cleansed, making sure to leave some of the prepping solution back to take advantage of its long term bactericidal properties.  5 cc solution made of 4 cc of 0.2% ropivacaine, 1 cc of methylprednisolone, 40 mg/cc.  Injected along the right suprascapular notch after contrast confirmation    Vitals:   05/26/22 1001 05/26/22 1006 05/26/22 1011 05/26/22 1023  BP: 125/83 (!) 124/91  127/84 130/60  Pulse:      Resp: '18 15 18 16  '$ Temp:      TempSrc:      SpO2: 99% 100% 100% 98%  Weight:      Height:         Start Time: 1000 hrs. End Time: 1011 hrs.  Imaging Guidance (Spinal):          Type of Imaging Technique: Fluoroscopy Guidance (Spinal) Indication(s): Assistance in needle guidance and placement for procedures requiring needle placement in or near specific anatomical locations not easily accessible without such assistance. Exposure Time: Please see nurses notes. Contrast: Before injecting any contrast, we confirmed that the patient did not have an allergy to iodine, shellfish, or radiological contrast. Once satisfactory needle placement was completed at the desired level, radiological contrast was injected. Contrast injected under live fluoroscopy. No contrast complications. See chart for type and volume of contrast used. Fluoroscopic Guidance: I was personally present during the use of fluoroscopy. "Tunnel Vision Technique" used to obtain the best possible view of the target area. Parallax error corrected before commencing the procedure. "Direction-depth-direction" technique used to introduce the needle under continuous pulsed fluoroscopy. Once target was reached, antero-posterior, oblique, and lateral fluoroscopic projection used confirm needle placement in all planes. Images permanently stored in EMR. Interpretation: I personally interpreted the imaging intraoperatively. Adequate needle placement confirmed in multiple planes. Appropriate spread of contrast into desired area was observed. No evidence of afferent or efferent intravascular uptake. No intrathecal or subarachnoid spread observed. Permanent images saved into the patient's record.  Antibiotic Prophylaxis:   Anti-infectives (From admission, onward)    None      Indication(s): None identified  Post-operative Assessment:  Post-procedure Vital Signs:  Pulse/HCG Rate: 7871 Temp: (!) 97.2 F (36.2  C) Resp: 16 BP: 130/60 SpO2: 98 %  EBL: None  Complications: No immediate post-treatment complications observed by team, or reported by patient.  Note: The patient tolerated the entire procedure well. A repeat set of vitals were taken after the procedure and the patient was kept under observation  following institutional policy, for this type of procedure. Post-procedural neurological assessment was performed, showing return to baseline, prior to discharge. The patient was provided with post-procedure discharge instructions, including a section on how to identify potential problems. Should any problems arise concerning this procedure, the patient was given instructions to immediately contact us, at any time, without hesitation. In any case, we plan to contact the patient by telephone for a follow-up status report regarding this interventional procedure.  Comments:  No additional relevant information.  Plan of Care  Orders:  Orders Placed This Encounter  Procedures   DG PAIN CLIN[facility] C-ARM 1-60 MIN NO REPORT    Intraoperative interpretation by procedural physician at [place].    Standing Status:   Standing    Number of Occurrences:   1    Order Specific Question:   Reason for exam:    Answer:   Assistance in needle guidance and placement for procedures requiring needle placement in or near specific anatomical locations not easily accessible without such assistance.   Medications ordered for procedure: Meds ordered this encounter  Medications   iohexol (OMNIPAQUE) 180 MG/ML injection 10 mL    Must be Myelogram-compatible. If not available, you may substitute with a water-soluble, non-ionic, hypoallergenic, myelogram-compatible radiological contrast medium.   lidocaine (XYLOCAINE) 2 % (with pres) injection 400 mg   lactated ringers infusion   midazolam (VERSED) injection 0.5-2 mg    Make sure Flumazenil is available in the pyxis when using this medication. If oversedation  occurs, administer 0.2 mg IV over 15 sec. If after 45 sec no response, administer 0.2 mg again over 1 min; may repeat at 1 min intervals; not to exceed 4 doses (1 mg)   sodium chloride flush (NS) 0.9 % injection 1 mL   ropivacaine (PF) 2 mg/mL (0.2%) (NAROPIN) injection 1 mL   dexamethasone (DECADRON) injection 10 mg   ropivacaine (PF) 2 mg/mL (0.2%) (NAROPIN) injection 2 mL   methylPREDNISolone acetate (DEPO-MEDROL) injection 40 mg   Medications administered: We administered iohexol, lidocaine, midazolam, sodium chloride flush, ropivacaine (PF) 2 mg/mL (0.2%), dexamethasone, ropivacaine (PF) 2 mg/mL (0.2%), and methylPREDNISolone acetate.  See the medical record for exact dosing, route, and time of administration.  Follow-up plan:   Return in about 4 weeks (around 06/23/2022) for Post Procedure Evaluation, in person.       B/L IA Knee steroid 12/12/19, 07/29/21: Helpful,  T10, 11, 12 MBB on 12/19/2019 followed by RFA (previosly done with Dr Maryjean Ka in 2018), status post bilateral T10, T11, T12 thoracic facet medial branch nerve block 8/23/ 2021 which provided 75% pain relief for 3-1/2 weeks with gradual return of pain thereafter.  Right T10, T11, T12 RFA on 01/30/2020; left T10, T11, T12 RFA on 02/20/2020, 12/05/20, 07/29/20, Left L4/5 ESI 05/05/22, right C7/T1 ESI and right SSNB 05/26/22      Recent Visits Date Type Provider Dept  05/05/22 Procedure visit Gillis Santa, MD [facility]  04/24/22 Office Visit Gillis Santa, MD [facility]  04/14/22 Office Visit Gillis Santa, MD [facility]  Showing recent visits within past 90 days and meeting all other requirements Today's Visits Date Type Provider Dept  05/26/22 Procedure visit Gillis Santa, MD [facility]  Showing today's visits and meeting all other requirements Future Appointments Date Type Provider Dept  06/18/22 Appointment Gillis Santa, MD [facility]  Showing future appointments within  next 90 days and meeting all other requirements  Disposition: Discharge home  Discharge (Date  Time): 05/26/2022; 1026 hrs.   Primary Care Physician: Rogelio Giovanni, MD Location: [facility] Note by: Gillis Santa, MD Date: 05/26/2022; Time: 11:35 AM  Disclaimer:  Medicine is not an exact science. The only guarantee in medicine is that nothing is guaranteed. It is important to note that the decision to proceed with this intervention was based on the information collected from the patient. The Data and conclusions were drawn from the patient's questionnaire, the interview, and the physical examination. Because the information was provided in large part by the patient, it cannot be guaranteed that it has not been purposely or unconsciously manipulated. Every effort has been made to obtain as much relevant data as possible for this evaluation. It is important to note that the conclusions that lead to this procedure are derived in large part from the available data. Always take into account that the treatment will also be dependent on availability of resources and existing treatment guidelines, considered by other Pain Management Practitioners as being common knowledge and practice, at the time of the intervention. For Medico-Legal purposes, it is also important to point out that variation in procedural techniques and pharmacological choices are the acceptable norm. The indications, contraindications, technique, and results of the above procedure should only be interpreted and judged by a Board-Certified Interventional Pain Specialist with extensive familiarity and expertise in the same exact procedure and technique.

## 2022-05-26 NOTE — Progress Notes (Signed)
Safety precautions to be maintained throughout the outpatient stay will include: orient to surroundings, keep bed in low position, maintain call bell within reach at all times, provide assistance with transfer out of bed and ambulation.  

## 2022-05-26 NOTE — Progress Notes (Signed)
PROVIDER NOTE: Interpretation of information contained herein should be left to medically-trained personnel. Specific patient instructions are provided elsewhere under "Patient Instructions" section of medical record. This document was created in part using STT-dictation technology, any transcriptional errors that may result from this process are unintentional.  Patient: Evan Cherry Type: Established DOB: 1965-07-28 MRN: [id number] PCP: Ishaq Giovanni, MD  Service: Procedure DOS: 05/26/2022 Setting: Ambulatory Location: Ambulatory outpatient facility Delivery: Face-to-face Provider: Gillis Santa, MD Specialty: Interventional Pain Management Specialty designation: 09 Location: Outpatient facility Ref. Prov.: Charan Giovanni, MD   Procedure [facility] Interventional Pain Management )   Type: Cervical Epidural Steroid injection (ESI) (Interlaminar) #1  Laterality: Right  Level: C7-T1 Imaging: Fluoroscopy-assisted DOS: 05/26/2022  Performed by: Gillis Santa, MD Anesthesia: Local anesthesia (1-2% Lidocaine) Anxiolysis: IV Versed         Sedation: Minimal Sedation                         Purpose: Diagnostic/Therapeutic Indications: Cervicalgia, cervical radicular pain, degenerative disc disease, severe enough to impact quality of life or function. 1. Cervical radicular pain   2. 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 Rupture of right supraspinatus tendon, sequela   4. Neuropathy of right suprascapular nerve    NAS-11 score:   Pre-procedure: 5 /10   Post-procedure: 0-No pain/10      Pre-Procedure Preparation  Monitoring: As per clinic protocol. Respiration, ETCO2, SpO2, BP, heart rate and rhythm monitor placed and checked for adequate function  Risk Assessment: Vitals:  DTO:[id number] body mass index is 35.88 kg/m as calculated from the following:   Height as of this encounter: '5\' 8"'$  (1.727 m).   Weight as of this encounter: 236 lb (107 kg)., Rate:78ECG Heart Rate: 76, BP:126/66,  Resp:18, Temp:(!) 97.2 F (36.2 C), SpO2:98 %  Allergies: He has No Known Allergies.  Precautions: None required  Blood-thinner(s): None at this time  Coagulopathies: Reviewed. None identified.   Active Infection(s): Reviewed. None identified. Evan Cherry is afebrile   Location setting: Procedure suite Position: Prone, on modified reverse trendelenburg to facilitate breathing, with head in head-cradle. Pillows positioned under chest (below chin-level) with cervical spine flexed. Safety Precautions: Patient was assessed for positional comfort and pressure points before starting the procedure. Prepping solution: DuraPrep (Iodine Povacrylex [0.7% available iodine] and Isopropyl Alcohol, 74% w/w) Prep Area: Entire  cervicothoracic region Approach: percutaneous, paramedial Intended target: Posterior cervical epidural space Materials: Tray: Epidural Needle(s): Epidural (Tuohy) Qty: 1 Length: (57m) 3.5-inch Gauge: 22G   Meds ordered this encounter  Medications   iohexol (OMNIPAQUE) 180 MG/ML injection 10 mL    Must be Myelogram-compatible. If not available, you may substitute with a water-soluble, non-ionic, hypoallergenic, myelogram-compatible radiological contrast medium.   lidocaine (XYLOCAINE) 2 % (with pres) injection 400 mg   lactated ringers infusion   midazolam (VERSED) injection 0.5-2 mg    Make sure Flumazenil is available in the pyxis when using this medication. If oversedation occurs, administer 0.2 mg IV over 15 sec. If after 45 sec no response, administer 0.2 mg again over 1 min; may repeat at 1 min intervals; not to exceed 4 doses (1 mg)   sodium chloride flush (NS) 0.9 % injection 1 mL   ropivacaine (PF) 2 mg/mL (0.2%) (NAROPIN) injection 1 mL   dexamethasone (DECADRON) injection 10 mg   ropivacaine (PF) 2 mg/mL (0.2%) (NAROPIN) injection 2 mL   methylPREDNISolone acetate (DEPO-MEDROL) injection 40 mg    Orders Placed This Encounter  Procedures   DG PAIN CLINIC C-ARM 1-60  MIN NO REPORT    Intraoperative interpretation by procedural physician at [place].    Standing Status:   Standing    Number of Occurrences:   1    Order Specific Question:   Reason for exam:    Answer:   Assistance in needle guidance and placement for procedures requiring needle placement in or near specific anatomical locations not easily accessible without such assistance.     Time-out: 1000 I initiated and conducted the "Time-out" before starting the procedure, as per protocol. The patient was asked to participate by confirming the accuracy of the "Time Out" information. Verification of the correct person, site, and procedure were performed and confirmed by me, the nursing staff, and the patient. "Time-out" conducted as per Joint Commission's Universal Protocol (UP.01.01.01). Procedure checklist: Completed   H&P (Pre-op  Assessment)  Evan Cherry is a 57 y.o. (year old), male patient, seen today for interventional treatment. He  has no past surgical history on file. Evan Cherry has a current medication list which includes the following prescription(s): acetaminophen, albuterol, albuterol, azelastine, benazepril, benazepril-hydrochlorthiazide, budesonide-formoterol, ciclesonide, cyanocobalamin, cyclobenzaprine, diclofenac sodium, dupilumab, epinephrine, guaifenesin, hydrochlorothiazide, lidocaine, loratadine, morphine, naproxen, naproxen sodium, pravastatin, prednisone, testosterone cypionate, tiotropium bromide monohydrate, tramadol, fasenra, carboxymethylcellulose sodium, cholecalciferol, cyanocobalamin, doxycycline, hydrocodone-acetaminophen, hydrocodone-acetaminophen, meloxicam, multiple vitamin, tiotropium, and tizanidine, and the following Facility-Administered Medications: lactated ringers. His primarily concern today is the Neck Pain, Knee Pain (bilat), Back Pain, and Shoulder Pain (Right)  He has No Known Allergies.   Last encounter: My last encounter with him was on  05/05/2022. Pertinent problems: Evan Cherry has Bilateral post-traumatic osteoarthritis of knee; Chronic pain of both knees; Thoracic facet joint syndrome; Thoracic spondylosis without myelopathy; and Lumbar facet arthropathy on their pertinent problem list. Pain Assessment: Severity of Chronic pain is reported as a 5 /10. Location: Neck  /"across shoulders and up to head - I have headaches all the time". Onset: More than a month ago. Quality: Dull, Throbbing. Timing: Constant. Modifying factor(s): meds, ice, heat, meds. Vitals:  height is '5\' 8"'$  (1.727 m) and weight is 236 lb (107 kg). His temporal temperature is 97.2 F (36.2 C) (abnormal). His blood pressure is 130/60 and his pulse is 78. His respiration is 16 and oxygen saturation is 98%.   Reason for encounter: Interventional pain management therapy due pain of at least four (4) weeks in duration, with to failure to respond to and/or inability to tolerate more conservative care.   Site Confirmation: Evan Cherry was asked to confirm the procedure and laterality before marking the site.  Consent: Before the procedure and under the influence of no sedative(s), amnesic(s), or anxiolytics, the patient was informed of the treatment options, risks and possible complications. To fulfill our ethical and legal obligations, as recommended by the American Medical Association's Code of Ethics, I have informed the patient of my clinical impression; the nature and purpose of the treatment or procedure; the risks, benefits, and possible complications of the intervention; the alternatives, including doing nothing; the risk(s) and benefit(s) of the alternative treatment(s) or procedure(s); and the risk(s) and benefit(s) of doing nothing. The patient was provided information about the general risks and possible complications associated with the procedure. These may include, but are not limited to: failure to achieve desired goals, infection, bleeding, organ or nerve damage,  allergic reactions, paralysis, and death. In addition, the patient was informed of those risks and complications associated to Spine-related procedures, such as failure  to decrease pain; infection (i.e.: Meningitis, epidural or intraspinal abscess); bleeding (i.e.: epidural hematoma, subarachnoid hemorrhage, or any other type of intraspinal or peri-dural bleeding); organ or nerve damage (i.e.: Any type of peripheral nerve, nerve root, or spinal cord injury) with subsequent damage to sensory, motor, and/or autonomic systems, resulting in permanent pain, numbness, and/or weakness of one or several areas of the body; allergic reactions; (i.e.: anaphylactic reaction); and/or death. Furthermore, the patient was informed of those risks and complications associated with the medications. These include, but are not limited to: allergic reactions (i.e.: anaphylactic or anaphylactoid reaction(s)); adrenal axis suppression; blood sugar elevation that in diabetics may result in ketoacidosis or comma; water retention that in patients with history of congestive heart failure may result in shortness of breath, pulmonary edema, and decompensation with resultant heart failure; weight gain; swelling or edema; medication-induced neural toxicity; particulate matter embolism and blood vessel occlusion with resultant organ, and/or nervous system infarction; and/or aseptic necrosis of one or more joints. Finally, the patient was informed that Medicine is not an exact science; therefore, there is also the possibility of unforeseen or unpredictable risks and/or possible complications that may result in a catastrophic outcome. The patient indicated having understood very clearly. We have given the patient no guarantees and we have made no promises. Enough time was given to the patient to ask questions, all of which were answered to the patient's satisfaction. Evan Cherry has indicated that he wanted to continue with the  procedure. Attestation: I, the ordering provider, attest that I have discussed with the patient the benefits, risks, side-effects, alternatives, likelihood of achieving goals, and potential problems during recovery for the procedure that I have provided informed consent.  Date  Time: 05/26/2022  9:13 AM   Prophylactic antibiotics  Anti-infectives (From admission, onward)    None      Indication(s): None identified   Description of procedure   Start Time: 1000 hrs  Local Anesthesia: Once the patient was positioned, prepped, and time-out was completed. The target area was identified located. The skin was marked with an approved surgical skin marker. Once marked, the skin (epidermis, dermis, and hypodermis), and deeper tissues (fat, connective tissue and muscle) were infiltrated with a small amount of a short-acting local anesthetic, loaded on a 10cc syringe with a 25G, 1.5-in  Needle. An appropriate amount of time was allowed for local anesthetics to take effect before proceeding to the next step. Local Anesthetic: Lidocaine 1-2% The unused portion of the local anesthetic was discarded in the proper designated containers. Safety Precautions: Aspiration looking for blood return was conducted prior to all injections. At no point did I inject any substances, as a needle was being advanced. Before injecting, the patient was told to immediately notify me if he was experiencing any new onset of "ringing in the ears, or metallic taste in the mouth". No attempts were made at seeking any paresthesias. Safe injection practices and needle disposal techniques used. Medications properly checked for expiration dates. SDV (single dose vial) medications used. After the completion of the procedure, all disposable equipment used was discarded in the proper designated medical waste containers.  Technical description: Protocol guidelines were followed. Using fluoroscopic guidance, the epidural needle was introduced  through the skin, ipsilateral to the reported pain, and advanced to the target area. Posterior laminar os was contacted and the needle walked caudad, until the lamina was cleared. The ligamentum flavum was engaged and the epidural space identified using "loss-of-resistance technique" with 2-3 ml of PF-NaCl (  0.9% NSS), in a 5cc dedicated LOR syringe. See "Imaging guidance" below for use of contrast details.  3 cc solution made of 1cc of preservative-free saline, 1 cc of 0.2% ropivacaine, 1 cc of Decadron 10 mg/cc.   Injection: Once satisfactory needle placement was confirmed, I proceeded to inject the desired solution in slow, incremental fashion, intermittently assessing for discomfort or any signs of abnormal or undesired spread of substance. Once completed, the needle was removed and disposed of, as per hospital protocols.   Vitals:   05/26/22 1001 05/26/22 1006 05/26/22 1011 05/26/22 1023  BP: 125/83 (!) 124/91 127/84 130/60  Pulse:      Resp: '18 15 18 16  '$ Temp:      TempSrc:      SpO2: 99% 100% 100% 98%  Weight:      Height:        End Time: 1011 hrs  Once the entire procedure was completed, the treated area was cleaned, making sure to leave some of the prepping solution back to take advantage of its long term bactericidal properties.   Imaging guidance  Type of Imaging Technique: Fluoroscopy Guidance (Spinal) Indication(s): Assistance in needle guidance and placement for procedures requiring needle placement in or near specific anatomical locations not easily accessible without such assistance. Exposure Time: Please see nurses notes for exact fluoroscopy time. Contrast: Before injecting any contrast, we confirmed that the patient did not have an allergy to iodine, shellfish, or radiological contrast. Once satisfactory needle placement was completed, radiological contrast was injected under continuous fluoroscopic guidance. Injection of contrast accomplished without complications. See  chart for type and volume of contrast used. Fluoroscopic Guidance: I was personally present in the fluoroscopy suite, where the patient was placed in position for the procedure, over the fluoroscopy-compatible table. Fluoroscopy was manipulated, using "Tunnel Vision Technique", to obtain the best possible view of the target area, on the affected side. Parallax error was corrected before commencing the procedure. A "direction-depth-direction" technique was used to introduce the needle under continuous pulsed fluoroscopic guidance. Once the target was reached, antero-posterior, oblique, and lateral fluoroscopic projection views were taken to confirm needle placement in all planes. Electronic images uploaded into EMR.  Interpretation: Successful epidural injection. Intraoperative imaging interpretation by performing Physician.    Post-op assessment  Post-procedure Vital Signs:  Pulse/HCG Rate: 7871 Temp: (!) 97.2 F (36.2 C) Resp: 16 BP: 130/60 SpO2: 98 %  EBL: None  Complications: No immediate post-treatment complications observed by team, or reported by patient.  Note: The patient tolerated the entire procedure well. A repeat set of vitals were taken after the procedure and the patient was kept under observation following institutional policy, for this type of procedure. Post-procedural neurological assessment was performed, showing return to baseline, prior to discharge. The patient was provided with post-procedure discharge instructions, including a section on how to identify potential problems. Should any problems arise concerning this procedure, the patient was given instructions to immediately contact us, at any time, without hesitation. In any case, we plan to contact the patient by telephone for a follow-up status report regarding this interventional procedure.  Comments:  No additional relevant information.   Plan of care    Medications administered: We administered iohexol,  lidocaine, midazolam, sodium chloride flush, ropivacaine (PF) 2 mg/mL (0.2%), dexamethasone, ropivacaine (PF) 2 mg/mL (0.2%), and methylPREDNISolone acetate.  Follow-up plan:   Return in about 4 weeks (around 06/23/2022) for Post Procedure Evaluation, in person.      B/L IA Knee steroid 12/12/19,  07/29/21: Helpful,  T10, 11, 12 MBB on 12/19/2019 followed by RFA (previosly done with Dr Maryjean Ka in 2018), status post bilateral T10, T11, T12 thoracic facet medial branch nerve block 8/23/ 2021 which provided 75% pain relief for 3-1/2 weeks with gradual return of pain thereafter.  Right T10, T11, T12 RFA on 01/30/2020; left T10, T11, T12 RFA on 02/20/2020, 12/05/20, 07/29/20, Left L4/5 ESI 05/05/22, right C7/T1 ESI 05/26/22       Recent Visits Date Type Provider Dept  05/05/22 Procedure visit Gillis Santa, MD [facility]  04/24/22 Office Visit Gillis Santa, MD [facility]  04/14/22 Office Visit Gillis Santa, MD [facility]  Showing recent visits within past 90 days and meeting all other requirements Today's Visits Date Type Provider Dept  05/26/22 Procedure visit Gillis Santa, MD [facility]  Showing today's visits and meeting all other requirements Future Appointments Date Type Provider Dept  06/18/22 Appointment Gillis Santa, MD [facility]  Showing future appointments within next 90 days and meeting all other requirements   Disposition: Discharge home  Discharge (Date  Time): 05/26/2022; 1026 hrs.   Primary Care Physician: Kelan Giovanni, MD Location: [facility] Note by: Gillis Santa, MD Date: 05/26/2022; Time: 11:34 AM  DISCLAIMER: Medicine is not an exact science. It has no guarantees or warranties. The decision to proceed with this intervention was based on the information collected from the patient. Conclusions were drawn from the patient's questionnaire, interview, and examination. Because information was  provided in large part by the patient, it cannot be guaranteed that it has not been purposely or unconsciously manipulated or altered. Every effort has been made to obtain as much accurate, relevant, available data as possible. Always take into account that the treatment will also be dependent on availability of resources and existing treatment guidelines, considered by other Pain Management Specialists as being common knowledge and practice, at the time of the intervention. It is also important to point out that variation in procedural techniques and pharmacological choices are the acceptable norm. For Medico-Legal review purposes, the indications, contraindications, technique, and results of the these procedures should only be evaluated, judged and interpreted by a Board-Certified Interventional Pain Specialist with extensive familiarity and expertise in the same exact procedure and technique.

## 2022-05-27 ENCOUNTER — Telehealth: Payer: Self-pay

## 2022-05-27 NOTE — Telephone Encounter (Signed)
Post procedure follow up.  Patient states he is doing ok,.

## 2022-06-18 ENCOUNTER — Ambulatory Visit
Payer: No Typology Code available for payment source | Attending: Student in an Organized Health Care Education/Training Program | Admitting: Student in an Organized Health Care Education/Training Program

## 2022-06-18 DIAGNOSIS — M5416 Radiculopathy, lumbar region: Secondary | ICD-10-CM

## 2022-06-18 DIAGNOSIS — S46011S Strain of muscle(s) and tendon(s) of the rotator cuff of right shoulder, sequela: Secondary | ICD-10-CM | POA: Diagnosis not present

## 2022-06-18 DIAGNOSIS — G5681 Other specified mononeuropathies of right upper limb: Secondary | ICD-10-CM | POA: Diagnosis not present

## 2022-06-18 DIAGNOSIS — M25511 Pain in right shoulder: Secondary | ICD-10-CM | POA: Diagnosis not present

## 2022-06-18 DIAGNOSIS — G894 Chronic pain syndrome: Secondary | ICD-10-CM

## 2022-06-18 DIAGNOSIS — G8929 Other chronic pain: Secondary | ICD-10-CM

## 2022-06-18 NOTE — Progress Notes (Signed)
Patient: Evan Cherry  Service Category: E/M  Provider: Gillis Santa, MD  DOB: 02/06/1966  DOS: 06/18/2022  Location: Office  MRN: [id number]  Setting: Ambulatory outpatient  Referring Provider: Tory Giovanni, MD  Type: Established Patient  Specialty: Interventional Pain Management  PCP: Jani Giovanni, MD  Location: Remote location  Delivery: TeleHealth     Virtual Encounter - Pain Management PROVIDER NOTE: Information contained herein reflects review and annotations entered in association with encounter. Interpretation of such information and data should be left to medically-trained personnel. Information provided to patient can be located elsewhere in the medical record under "Patient Instructions". Document created using STT-dictation technology, any transcriptional errors that may result from process are unintentional.    Contact & Pharmacy Preferred: [phone] Home: [phone] (home) Mobile: [phone] (mobile) E-mail: [email or web address], [place] [address] [place] [address] Phone: [phone] Fax: [phone]   Pre-screening  Evan Cherry offered "in-person" vs "virtual" encounter. He indicated preferring virtual for this encounter.   Reason COVID-19*  Social distancing based on CDC and AMA recommendations.   I contacted Evan Cherry on 06/18/2022 via telephone.      I clearly identified myself as Gillis Santa, MD. I verified that I was speaking with the correct person using two identifiers (Name: Evan Cherry, and date of birth: July 20, 1965).  Consent I sought verbal advanced consent from Evan Cherry for virtual visit interactions. I informed Evan Cherry of possible security and privacy concerns, risks, and limitations associated with providing "not-in-person" medical evaluation and management services. I also informed Evan Cherry of the availability of "in-person" appointments. Finally, I informed him that there would be a  charge for the virtual visit and that he could be  personally, fully or partially, financially responsible for it. Evan Cherry expressed understanding and agreed to proceed.   Historic Elements   Evan Cherry is a 57 y.o. year old, male patient evaluated today after our last contact on 05/26/2022. Evan Cherry  has a past medical history of Asthma, Back pain, COPD (chronic obstructive pulmonary disease) ([place]), Decreased lung capacity, Hypertension, and Sleep apnea. He also  has no past surgical history on file. Evan Cherry has a current medication list which includes the following prescription(s): acetaminophen, albuterol, albuterol, azelastine, benazepril, benazepril-hydrochlorthiazide, budesonide-formoterol, carboxymethylcellulose sodium, ciclesonide, cyanocobalamin, cyclobenzaprine, diclofenac sodium, dupilumab, epinephrine, guaifenesin, hydrochlorothiazide, lidocaine, loratadine, morphine, naproxen, naproxen sodium, pravastatin, prednisone, testosterone cypionate, tiotropium bromide monohydrate, tramadol, fasenra, cholecalciferol, cyanocobalamin, doxycycline, hydrocodone-acetaminophen, hydrocodone-acetaminophen, meloxicam, multiple vitamin, tiotropium, and tizanidine. He  reports that he has quit smoking. His smokeless tobacco use includes snuff. He reports that he does not drink alcohol and does not use drugs. Evan Cherry has No Known Allergies.  Estimated body mass index is 35.88 kg/m as calculated from the following:   Height as of 05/26/22: 5' 8"$  (1.727 m).   Weight as of 05/26/22: 236 lb (107 kg).  HPI  Today, he is being contacted for a post-procedure assessment.   Post-procedure evaluation   Type: Suprascapular nerve block (SSNB) #1  Laterality:  Right Level: Superior to scapular spine, lateral to supraspinatus fossa (Suprascapular notch).  Imaging: Fluoroscopic guidance         Anesthesia: Local anesthesia (1-2% Lidocaine) Sedation: Minimal Sedation                       DOS: 05/26/2022   Performed by: Gillis Santa, MD  Type: Cervical Epidural Steroid  injection (ESI) (Interlaminar) #1  Laterality: Right  Level: C7-T1 Imaging: Fluoroscopy-assisted DOS: 05/26/2022  Performed by: Gillis Santa, MD Anesthesia: Local anesthesia (1-2% Lidocaine) Anxiolysis: IV Versed         Sedation: Minimal Sedation        Purpose: Diagnostic/Therapeutic Indications: Shoulder pain, severe enough to impact quality of life and/or function. 1. Cervical radicular pain   2. 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 Rupture of right supraspinatus tendon, sequela   4. Neuropathy of right suprascapular nerve    NAS-11 score:   Pre-procedure: 5 /10   Post-procedure: 0-No pain/10      Effectiveness:  Initial hour after procedure: 0 %  Subsequent 4-6 hours post-procedure: 0 %  Analgesia past initial 6 hours: 0 %  Ongoing improvement:  Analgesic:  0% Function: No benefit ROM: No benefit   Laboratory Chemistry Profile   Renal Lab Results  Component Value Date   BUN 15 12/09/2014   CREATININE 0.82 12/09/2014   GFRAA >60 12/09/2014   GFRNONAA >60 12/09/2014    Hepatic Lab Results  Component Value Date   AST 25 12/08/2014   ALT 34 12/08/2014   ALBUMIN 4.4 12/08/2014   ALKPHOS 72 12/08/2014   LIPASE 171 12/26/2011    Electrolytes Lab Results  Component Value Date   NA 142 12/09/2014   K 4.2 12/09/2014   CL 107 12/09/2014   CALCIUM 9.2 12/09/2014    Bone No results found for: "VD25OH", "VD125OH2TOT", "[id number]", "UK:060616", "25OHVITD1", "25OHVITD2", "25OHVITD3", "TESTOFREE", "TESTOSTERONE"  Inflammation (CRP: Acute Phase) (ESR: Chronic Phase) No results found for: "CRP", "ESRSEDRATE", "LATICACIDVEN"       Note: Above Lab results reviewed.   Assessment  The primary encounter diagnosis was Lumbar radicular pain (left L3/4). Diagnoses of Chronic right shoulder pain, Rupture of right supraspinatus tendon, sequela, Neuropathy of right suprascapular nerve, and Chronic pain  syndrome were also pertinent to this visit.  Plan of Care  1. Lumbar radicular pain (left L3/4) - Lumbar Transforaminal Epidural; Future  2. Chronic right shoulder pain - SHOULDER INJECTION; Future  3. Rupture of right supraspinatus tendon, sequela - SHOULDER INJECTION; Future  4. Neuropathy of right suprascapular nerve  5. Chronic pain syndrome - Lumbar Transforaminal Epidural; Future - SHOULDER INJECTION; Future    Orders:  Orders Placed This Encounter  Procedures   Lumbar Transforaminal Epidural    Standing Status:   Future    Standing Expiration Date:   09/16/2022    Scheduling Instructions:     Laterality: Left L3,4         Sedation: PO Valium.     Timeframe: ASAP    Order Specific Question:   Where will this procedure be performed?    Answer:   [facility]   SHOULDER INJECTION    Standing Status:   Future    Standing Expiration Date:   09/16/2022    Scheduling Instructions:     Procedure: Intra-articular shoulder (Glenohumeral) joint injection     Side: RIGHT     Level: Glenohumeral joint               Sedation: PO Valium     Timeframe: As permitted by the schedule    Order Specific Question:   Where will this procedure be performed?    Answer:   [facility]   Follow-up plan:   Return in about 1 week (around 06/25/2022) for Left L3, 4 TF ESI + Right shoulder GH injection , in clinic (PO Valium).  Recent Visits Date Type Provider Dept  05/26/22 Procedure visit Gillis Santa, MD [facility]  05/05/22 Procedure visit Gillis Santa, MD [facility]  04/24/22 Office Visit Gillis Santa, MD [facility]  04/14/22 Office Visit Gillis Santa, MD [facility]  Showing recent visits within past 90 days and meeting all other requirements Today's Visits Date Type Provider Dept  06/18/22 Office Visit Gillis Santa, MD [facility]  Showing today's visits and meeting all other requirements Future  Appointments No visits were found meeting these conditions. Showing future appointments within next 90 days and meeting all other requirements  I discussed the assessment and treatment plan with the patient. The patient was provided an opportunity to ask questions and all were answered. The patient agreed with the plan and demonstrated an understanding of the instructions.  Patient advised to call back or seek an in-person evaluation if the symptoms or condition worsens.  Duration of encounter: 64mnutes.  Note by: BGillis Santa MD Date: 06/18/2022; Time: 4:31 PM

## 2022-06-25 ENCOUNTER — Ambulatory Visit
Payer: No Typology Code available for payment source | Admitting: Student in an Organized Health Care Education/Training Program

## 2022-07-02 ENCOUNTER — Ambulatory Visit
Payer: No Typology Code available for payment source | Admitting: Student in an Organized Health Care Education/Training Program

## 2022-07-16 ENCOUNTER — Ambulatory Visit
Admission: RE | Admit: 2022-07-16 | Discharge: 2022-07-16 | Disposition: A | Discharge status: Home / Self Care | Payer: No Typology Code available for payment source | Source: Ambulatory Visit | Attending: Student in an Organized Health Care Education/Training Program | Admitting: Student in an Organized Health Care Education/Training Program

## 2022-07-16 ENCOUNTER — Encounter: Payer: Self-pay | Admitting: Student in an Organized Health Care Education/Training Program

## 2022-07-16 ENCOUNTER — Ambulatory Visit
Payer: No Typology Code available for payment source | Attending: Student in an Organized Health Care Education/Training Program | Admitting: Student in an Organized Health Care Education/Training Program

## 2022-07-16 VITALS — BP 140/90 | HR 74 | Temp 98.5°F | Resp 15 | Ht 68.0 in | Wt 230.0 lb

## 2022-07-16 DIAGNOSIS — M25511 Pain in right shoulder: Secondary | ICD-10-CM | POA: Diagnosis present

## 2022-07-16 DIAGNOSIS — S46011S Strain of muscle(s) and tendon(s) of the rotator cuff of right shoulder, sequela: Secondary | ICD-10-CM | POA: Diagnosis present

## 2022-07-16 DIAGNOSIS — M5416 Radiculopathy, lumbar region: Secondary | ICD-10-CM | POA: Diagnosis present

## 2022-07-16 DIAGNOSIS — G8929 Other chronic pain: Secondary | ICD-10-CM | POA: Diagnosis present

## 2022-07-16 DIAGNOSIS — G894 Chronic pain syndrome: Secondary | ICD-10-CM

## 2022-07-16 MED ORDER — DIAZEPAM 5 MG PO TABS
ORAL_TABLET | ORAL | Status: AC
  Filled 2022-07-16: qty 2

## 2022-07-16 MED ORDER — METHYLPREDNISOLONE ACETATE 40 MG/ML IJ SUSP
40.0000 mg | Freq: Once | INTRAMUSCULAR | Status: AC
  Administered 2022-07-16: 40 mg via INTRA_ARTICULAR
  Filled 2022-07-16: qty 1

## 2022-07-16 MED ORDER — DIAZEPAM 5 MG PO TABS
10.0000 mg | ORAL_TABLET | ORAL | Status: AC
  Administered 2022-07-16: 10 mg via ORAL

## 2022-07-16 MED ORDER — IOHEXOL 180 MG/ML  SOLN
10.0000 mL | Freq: Once | INTRAMUSCULAR | Status: AC
  Administered 2022-07-16: 10 mL via EPIDURAL
  Filled 2022-07-16: qty 20

## 2022-07-16 MED ORDER — LIDOCAINE HCL 2 % IJ SOLN
20.0000 mL | Freq: Once | INTRAMUSCULAR | Status: AC
  Administered 2022-07-16: 100 mg

## 2022-07-16 MED ORDER — SODIUM CHLORIDE 0.9% FLUSH
2.0000 mL | Freq: Once | INTRAVENOUS | Status: AC
  Administered 2022-07-16: 2 mL

## 2022-07-16 MED ORDER — ROPIVACAINE HCL 2 MG/ML IJ SOLN
2.0000 mL | Freq: Once | INTRAMUSCULAR | Status: AC
  Administered 2022-07-16: 2 mL via EPIDURAL
  Filled 2022-07-16: qty 20

## 2022-07-16 MED ORDER — DEXAMETHASONE SODIUM PHOSPHATE 10 MG/ML IJ SOLN
20.0000 mg | Freq: Once | INTRAMUSCULAR | Status: AC
  Administered 2022-07-16: 20 mg
  Filled 2022-07-16: qty 2

## 2022-07-16 NOTE — Progress Notes (Signed)
PROVIDER NOTE: Interpretation of information contained herein should be left to medically-trained personnel. Specific patient instructions are provided elsewhere under "Patient Instructions" section of medical record. This document was created in part using STT-dictation technology, any transcriptional errors that may result from this process are unintentional.  Patient: Evan Cherry Type: Established DOB: 09/03/1965 MRN: [id number] PCP: Dace Giovanni, MD  Service: Procedure DOS: 07/16/2022 Setting: Ambulatory Location: Ambulatory outpatient facility Delivery: Face-to-face Provider: Gillis Santa, MD Specialty: Interventional Pain Management Specialty designation: 09 Location: Outpatient facility Ref. Prov.: Awais Giovanni, MD       Interventional Therapy   Procedure: Glenohumeral Joint (shoulder) Injection #1  Laterality: Right (-RT)  Level: Shoulder   Imaging: Fluoroscopy-guided         Anesthesia: Local anesthesia (1-2% Lidocaine) Anxiolysis: Oral Valium 10 mg DOS: 07/16/2022  Performed by: Gillis Santa, MD  Purpose: Diagnostic/Therapeutic Indications: Shoulder pain severe enough to impact quality of life or function. Rationale (medical necessity): procedure needed and proper for the diagnosis and/or treatment of Evan Cherry medical symptoms and needs. Right shoulder pain, right shoulder osteoarthritis NAS-11 Pain score:   Pre-procedure: 5 /10   Post-procedure: 5 /10      Target: Glenohumeral Joint (shoulder) Location: Intra-articular  Region: Entire Shoulder Area Approach: Anterior approach. Type of procedure: Percutaneous joint injection   Position  Prep  Materials:  Position: Supine Prep solution: DuraPrep (Iodine Povacrylex [0.7% available iodine] and Isopropyl Alcohol, 74% w/w) Prep Area: Entire shoulder Area Materials:  Tray: Block Needle(s):  Type: Spinal  Gauge (G): 22  Length: 3.5-in  Qty: 1  Pre-op H&P Assessment:  Evan Cherry is a 57 y.o. (year  old), male patient, seen today for interventional treatment. He  has no past surgical history on file. Evan Cherry has a current medication list which includes the following prescription(s): acetaminophen, albuterol, albuterol, azelastine, benazepril, benazepril-hydrochlorthiazide, fasenra, budesonide-formoterol, carboxymethylcellulose sodium, cholecalciferol, ciclesonide, cyanocobalamin, cyanocobalamin, cyclobenzaprine, diclofenac sodium, doxycycline, dupilumab, epinephrine, guaifenesin, hydrochlorothiazide, hydrocodone-acetaminophen, hydrocodone-acetaminophen, lidocaine, loratadine, meloxicam, morphine, multiple vitamin, naproxen, naproxen sodium, pravastatin, prednisone, testosterone cypionate, tiotropium, tiotropium bromide monohydrate, tizanidine, and tramadol. His primarily concern today is the Back Pain (Lower, and right shoulder)  Initial Vital Signs:  Pulse/HCG Rate: 74ECG Heart Rate: 75 Temp: 98.5 F (36.9 C) Resp: 16 BP: 123/68 SpO2: 97 %  BMI: Estimated body mass index is 34.97 kg/m as calculated from the following:   Height as of this encounter: 5\' 8"  (1.727 m).   Weight as of this encounter: 230 lb (104.3 kg).  Risk Assessment: Allergies: Reviewed. He has No Known Allergies.  Allergy Precautions: None required Coagulopathies: Reviewed. None identified.  Blood-thinner therapy: None at this time Active Infection(s): Reviewed. None identified. Evan Cherry is afebrile  Site Confirmation: Evan Cherry was asked to confirm the procedure and laterality before marking the site Procedure checklist: Completed Consent: Before the procedure and under the influence of no sedative(s), amnesic(s), or anxiolytics, the patient was informed of the treatment options, risks and possible complications. To fulfill our ethical and legal obligations, as recommended by the American Medical Association's Code of Ethics, I have informed the patient of my clinical impression; the nature and purpose of the treatment  or procedure; the risks, benefits, and possible complications of the intervention; the alternatives, including doing nothing; the risk(s) and benefit(s) of the alternative treatment(s) or procedure(s); and the risk(s) and benefit(s) of doing nothing. The patient was provided information about the general risks and possible complications associated with the procedure. These may include, but are not  limited to: failure to achieve desired goals, infection, bleeding, organ or nerve damage, allergic reactions, paralysis, and death. In addition, the patient was informed of those risks and complications associated to the procedure, such as failure to decrease pain; infection; bleeding; organ or nerve damage with subsequent damage to sensory, motor, and/or autonomic systems, resulting in permanent pain, numbness, and/or weakness of one or several areas of the body; allergic reactions; (i.e.: anaphylactic reaction); and/or death. Furthermore, the patient was informed of those risks and complications associated with the medications. These include, but are not limited to: allergic reactions (i.e.: anaphylactic or anaphylactoid reaction(s)); adrenal axis suppression; blood sugar elevation that in diabetics may result in ketoacidosis or comma; water retention that in patients with history of congestive heart failure may result in shortness of breath, pulmonary edema, and decompensation with resultant heart failure; weight gain; swelling or edema; medication-induced neural toxicity; particulate matter embolism and blood vessel occlusion with resultant organ, and/or nervous system infarction; and/or aseptic necrosis of one or more joints. Finally, the patient was informed that Medicine is not an exact science; therefore, there is also the possibility of unforeseen or unpredictable risks and/or possible complications that may result in a catastrophic outcome. The patient indicated having understood very clearly. We have given  the patient no guarantees and we have made no promises. Enough time was given to the patient to ask questions, all of which were answered to the patient's satisfaction. Evan Cherry has indicated that he wanted to continue with the procedure. Attestation: I, the ordering provider, attest that I have discussed with the patient the benefits, risks, side-effects, alternatives, likelihood of achieving goals, and potential problems during recovery for the procedure that I have provided informed consent. Date  Time: 07/16/2022  1:17 PM   Imaging Guidance (Non-Spinal):          Type of Imaging Technique: Fluoroscopy Guidance (Non-Spinal) Indication(s): Assistance in needle guidance and placement for procedures requiring needle placement in or near specific anatomical locations not easily accessible without such assistance. Exposure Time: Please see nurses notes. Contrast: None used. Fluoroscopic Guidance: I was personally present during the use of fluoroscopy. "Tunnel Vision Technique" used to obtain the best possible view of the target area. Parallax error corrected before commencing the procedure. "Direction-depth-direction" technique used to introduce the needle under continuous pulsed fluoroscopy. Once target was reached, antero-posterior, oblique, and lateral fluoroscopic projection used confirm needle placement in all planes. Images permanently stored in EMR. Interpretation: No contrast injected. I personally interpreted the imaging intraoperatively. Adequate needle placement confirmed in multiple planes. Permanent images saved into the patient's record.  Pre-Procedure Preparation:  Monitoring: As per clinic protocol. Respiration, ETCO2, SpO2, BP, heart rate and rhythm monitor placed and checked for adequate function Safety Precautions: Patient was assessed for positional comfort and pressure points before starting the procedure. Time-out: I initiated and conducted the "Time-out" before starting the  procedure, as per protocol. The patient was asked to participate by confirming the accuracy of the "Time Out" information. Verification of the correct person, site, and procedure were performed and confirmed by me, the nursing staff, and the patient. "Time-out" conducted as per Joint Commission's Universal Protocol (UP.01.01.01). Time: 1415  Description  Narrative of Procedure:          Rationale (medical necessity): procedure needed and proper for the diagnosis and/or treatment of the patient's medical symptoms and needs. Procedural Technique Safety Precautions: Aspiration looking for blood return was conducted prior to all injections. At no point did we inject  any substances, as a needle was being advanced. No attempts were made at seeking any paresthesias. Safe injection practices and needle disposal techniques used. Medications properly checked for expiration dates. SDV (single dose vial) medications used. Description of the Procedure: Protocol guidelines were followed. The patient was placed in position over the procedure table. The target area was identified and the area prepped in the usual manner. Skin & deeper tissues infiltrated with local anesthetic. Appropriate amount of time allowed to pass for local anesthetics to take effect. The procedure needles were then advanced to the target area. Proper needle placement secured. Negative aspiration confirmed. Solution injected in intermittent fashion, asking for systemic symptoms every 0.5cc of injectate. The needles were then removed and the area cleansed, making sure to leave some of the prepping solution back to take advantage of its long term bactericidal properties.  5 cc solution made of 4 cc of 0.2% ropivacaine, 1 cc of methylprednisolone, 40 mg/cc.  Injected into the right glenohumeral joint after contrast confirmation              Vitals:   07/16/22 1415 07/16/22 1420 07/16/22 1425 07/16/22 1429  BP: (!) 135/91 (!) 140/92 (!) 143/67  (!) 140/90  Pulse:      Resp: 18 16 15 15   Temp:      SpO2: 100% 100% 100% 100%  Weight:      Height:         Start Time: 1415 hrs. End Time: 1429 hrs.  Antibiotic Prophylaxis:   Anti-infectives (From admission, onward)    None      Indication(s): None identified  Post-operative Assessment:  Post-procedure Vital Signs:  Pulse/HCG Rate: 7471 Temp: 98.5 F (36.9 C) Resp: 15 BP: (!) 140/90 SpO2: 100 %  EBL: None  Complications: No immediate post-treatment complications observed by team, or reported by patient.  Note: The patient tolerated the entire procedure well. A repeat set of vitals were taken after the procedure and the patient was kept under observation following institutional policy, for this type of procedure. Post-procedural neurological assessment was performed, showing return to baseline, prior to discharge. The patient was provided with post-procedure discharge instructions, including a section on how to identify potential problems. Should any problems arise concerning this procedure, the patient was given instructions to immediately contact us, at any time, without hesitation. In any case, we plan to contact the patient by telephone for a follow-up status report regarding this interventional procedure.  Comments:  No additional relevant information.  Plan of Care (POC)  Orders:  Orders Placed This Encounter  Procedures   DG PAIN CLINIC C-ARM 1-60 MIN NO REPORT    Intraoperative interpretation by procedural physician at [place].    Standing Status:   Standing    Number of Occurrences:   1    Order Specific Question:   Reason for exam:    Answer:   Assistance in needle guidance and placement for procedures requiring needle placement in or near specific anatomical locations not easily accessible without such assistance.     Medications ordered for procedure: Meds ordered this encounter  Medications   iohexol (OMNIPAQUE) 180 MG/ML injection  10 mL    Must be Myelogram-compatible. If not available, you may substitute with a water-soluble, non-ionic, hypoallergenic, myelogram-compatible radiological contrast medium.   lidocaine (XYLOCAINE) 2 % (with pres) injection 400 mg   diazepam (VALIUM) tablet 10 mg    Make sure Flumazenil is available in the pyxis when using this medication. If  oversedation occurs, administer 0.2 mg IV over 15 sec. If after 45 sec no response, administer 0.2 mg again over 1 min; may repeat at 1 min intervals; not to exceed 4 doses (1 mg)   sodium chloride flush (NS) 0.9 % injection 2 mL    This is for a two (2) level block. Use two (2) syringes and divide content in half.   ropivacaine (PF) 2 mg/mL (0.2%) (NAROPIN) injection 2 mL    This is for a two (2) level block. Use two (2) syringes and divide content in half.   dexamethasone (DECADRON) injection 20 mg    This is for a two (2) level block. Use two (2) syringes and divide content in half.   methylPREDNISolone acetate (DEPO-MEDROL) injection 40 mg   Medications administered: We administered iohexol, lidocaine, diazepam, sodium chloride flush, ropivacaine (PF) 2 mg/mL (0.2%), dexamethasone, and methylPREDNISolone acetate.  See the medical record for exact dosing, route, and time of administration.  Follow-up plan:   Return in about 4 weeks (around 08/13/2022) for Post Procedure Evaluation, in person.       Recent Visits Date Type Provider Dept  06/18/22 Office Visit Gillis Santa, MD [facility]  05/26/22 Procedure visit Gillis Santa, MD [facility]  05/05/22 Procedure visit Gillis Santa, MD [facility]  04/24/22 Office Visit Gillis Santa, MD [facility]  Showing recent visits within past 90 days and meeting all other requirements Today's Visits Date Type Provider Dept  07/16/22 Procedure visit Gillis Santa, MD [facility]  Showing today's visits and meeting all other requirements Future  Appointments Date Type Provider Dept  08/13/22 Appointment Gillis Santa, MD [facility]  Showing future appointments within next 90 days and meeting all other requirements  Disposition: Discharge home  Discharge (Date  Time): 07/16/2022; 1140 hrs.   Primary Care Physician: Sukhdeep Giovanni, MD Location: [facility] Note by: Gillis Santa, MD (TTS technology used. I apologize for any typographical errors that were not detected and corrected.) Date: 07/16/2022; Time: 2:36 PM  Disclaimer:  Medicine is not an Chief Strategy Officer. The only guarantee in medicine is that nothing is guaranteed. It is important to note that the decision to proceed with this intervention was based on the information collected from the patient. The Data and conclusions were drawn from the patient's questionnaire, the interview, and the physical examination. Because the information was provided in large part by the patient, it cannot be guaranteed that it has not been purposely or unconsciously manipulated. Every effort has been made to obtain as much relevant data as possible for this evaluation. It is important to note that the conclusions that lead to this procedure are derived in large part from the available data. Always take into account that the treatment will also be dependent on availability of resources and existing treatment guidelines, considered by other Pain Management Practitioners as being common knowledge and practice, at the time of the intervention. For Medico-Legal purposes, it is also important to point out that variation in procedural techniques and pharmacological choices are the acceptable norm. The indications, contraindications, technique, and results of the above procedure should only be interpreted and judged by a Board-Certified Interventional Pain Specialist with extensive familiarity and expertise in the same exact procedure and technique.

## 2022-07-16 NOTE — Progress Notes (Signed)
PROVIDER NOTE: Interpretation of information contained herein should be left to medically-trained personnel. Specific patient instructions are provided elsewhere under "Patient Instructions" section of medical record. This document was created in part using STT-dictation technology, any transcriptional errors that may result from this process are unintentional.  Patient: Evan Cherry Type: Established DOB: 20-Dec-1965 MRN: [id number] PCP: Raza Giovanni, MD  Service: Procedure DOS: 07/16/2022 Setting: Ambulatory Location: Ambulatory outpatient facility Delivery: Face-to-face Provider: Gillis Santa, MD Specialty: Interventional Pain Management Specialty designation: 09 Location: Outpatient facility Ref. Prov.: Deldrick Giovanni, MD       Interventional Therapy   Procedure: Lumbar trans-foraminal epidural steroid injection (L-TFESI) #1  Laterality: Left (-LT)  Level: L3 and L4 nerve root(s) Imaging: Fluoroscopy-guided         Anesthesia: Local anesthesia (1-2% Lidocaine) Anxiolysis:10 mg PO Valium DOS: 07/16/2022  Performed by: Gillis Santa, MD  Purpose: Diagnostic/Therapeutic Indications: Lumbar radicular pain severe enough to impact quality of life or function. 1. Lumbar radicular pain (left L3/4)   2. Chronic right shoulder pain   3. Rupture of right supraspinatus tendon, sequela   4. Chronic pain syndrome    NAS-11 Pain score:   Pre-procedure: 5 /10   Post-procedure: 5 /10     Position / Prep / Materials:  Position: Prone  Prep solution: DuraPrep (Iodine Povacrylex [0.7% available iodine] and Isopropyl Alcohol, 74% w/w) Prep Area: Entire Posterior Lumbosacral Area.  From the lower tip of the scapula down to the tailbone and from flank to flank. Materials:  Tray: Block Needle(s):  Type: Spinal  Gauge (G): 22  Length: 3.5-in  Qty: 2      Pre-op H&P Assessment:  [name] is a 57 y.o. (year old), male patient, seen today for interventional treatment. He  has no past  surgical history on file. [name] has a current medication list which includes the following prescription(s): acetaminophen, albuterol, albuterol, azelastine, benazepril, benazepril-hydrochlorthiazide, fasenra, budesonide-formoterol, carboxymethylcellulose sodium, cholecalciferol, ciclesonide, cyanocobalamin, cyanocobalamin, cyclobenzaprine, diclofenac sodium, doxycycline, dupilumab, epinephrine, guaifenesin, hydrochlorothiazide, hydrocodone-acetaminophen, hydrocodone-acetaminophen, lidocaine, loratadine, meloxicam, morphine, multiple vitamin, naproxen, naproxen sodium, pravastatin, prednisone, testosterone cypionate, tiotropium, tiotropium bromide monohydrate, tizanidine, and tramadol. His primarily concern today is the Back Pain (Lower, and right shoulder)  Initial Vital Signs:  Pulse/HCG Rate: 74ECG Heart Rate: 75 Temp: 98.5 F (36.9 C) Resp: 16 BP: 123/68 SpO2: 97 %  BMI: Estimated body mass index is 34.97 kg/m as calculated from the following:   Height as of this encounter: 5\' 8"  (1.727 m).   Weight as of this encounter: 230 lb (104.3 kg).  Risk Assessment: Allergies: Reviewed. He has No Known Allergies.  Allergy Precautions: None required Coagulopathies: Reviewed. None identified.  Blood-thinner therapy: None at this time Active Infection(s): Reviewed. None identified. [name] is afebrile  Site Confirmation: [name] was asked to confirm the procedure and laterality before marking the site Procedure checklist: Completed Consent: Before the procedure and under the influence of no sedative(s), amnesic(s), or anxiolytics, the patient was informed of the treatment options, risks and possible complications. To fulfill our ethical and legal obligations, as recommended by the American Medical Association's Code of Ethics, I have informed the patient of my clinical impression; the nature and purpose of the treatment or procedure; the risks, benefits, and possible complications of the  intervention; the alternatives, including doing nothing; the risk(s) and benefit(s) of the alternative treatment(s) or procedure(s); and the risk(s) and benefit(s) of doing nothing. The patient was provided information about the general risks and possible complications  associated with the procedure. These may include, but are not limited to: failure to achieve desired goals, infection, bleeding, organ or nerve damage, allergic reactions, paralysis, and death. In addition, the patient was informed of those risks and complications associated to Spine-related procedures, such as failure to decrease pain; infection (i.e.: Meningitis, epidural or intraspinal abscess); bleeding (i.e.: epidural hematoma, subarachnoid hemorrhage, or any other type of intraspinal or peri-dural bleeding); organ or nerve damage (i.e.: Any type of peripheral nerve, nerve root, or spinal cord injury) with subsequent damage to sensory, motor, and/or autonomic systems, resulting in permanent pain, numbness, and/or weakness of one or several areas of the body; allergic reactions; (i.e.: anaphylactic reaction); and/or death. Furthermore, the patient was informed of those risks and complications associated with the medications. These include, but are not limited to: allergic reactions (i.e.: anaphylactic or anaphylactoid reaction(s)); adrenal axis suppression; blood sugar elevation that in diabetics may result in ketoacidosis or comma; water retention that in patients with history of congestive heart failure may result in shortness of breath, pulmonary edema, and decompensation with resultant heart failure; weight gain; swelling or edema; medication-induced neural toxicity; particulate matter embolism and blood vessel occlusion with resultant organ, and/or nervous system infarction; and/or aseptic necrosis of one or more joints. Finally, the patient was informed that Medicine is not an exact science; therefore, there is also the possibility of  unforeseen or unpredictable risks and/or possible complications that may result in a catastrophic outcome. The patient indicated having understood very clearly. We have given the patient no guarantees and we have made no promises. Enough time was given to the patient to ask questions, all of which were answered to the patient's satisfaction. Mr. Sorto has indicated that he wanted to continue with the procedure. Attestation: I, the ordering provider, attest that I have discussed with the patient the benefits, risks, side-effects, alternatives, likelihood of achieving goals, and potential problems during recovery for the procedure that I have provided informed consent. Date  Time: 07/16/2022  1:17 PM   Pre-Procedure Preparation:  Monitoring: As per clinic protocol. Respiration, ETCO2, SpO2, BP, heart rate and rhythm monitor placed and checked for adequate function Safety Precautions: Patient was assessed for positional comfort and pressure points before starting the procedure. Time-out: I initiated and conducted the "Time-out" before starting the procedure, as per protocol. The patient was asked to participate by confirming the accuracy of the "Time Out" information. Verification of the correct person, site, and procedure were performed and confirmed by me, the nursing staff, and the patient. "Time-out" conducted as per Joint Commission's Universal Protocol (UP.01.01.01). Time: 1415  Description/Narrative of Procedure:          Target: The 6 o'clock position under the pedicle, on the affected side. Region: Posterolateral Lumbosacral Approach: Posterior Percutaneous Paravertebral approach.  Rationale (medical necessity): procedure needed and proper for the diagnosis and/or treatment of the patient's medical symptoms and needs. Procedural Technique Safety Precautions: Aspiration looking for blood return was conducted prior to all injections. At no point did we inject any substances, as a needle was being  advanced. No attempts were made at seeking any paresthesias. Safe injection practices and needle disposal techniques used. Medications properly checked for expiration dates. SDV (single dose vial) medications used. Description of the Procedure: Protocol guidelines were followed. The patient was placed in position over the procedure table. The target area was identified and the area prepped in the usual manner. Skin & deeper tissues infiltrated with local anesthetic. Appropriate amount of time allowed to  pass for local anesthetics to take effect. The procedure needles were then advanced to the target area. Proper needle placement secured. Negative aspiration confirmed. Solution injected in intermittent fashion, asking for systemic symptoms every 0.5cc of injectate. The needles were then removed and the area cleansed, making sure to leave some of the prepping solution back to take advantage of its long term bactericidal properties.  5 cc solution made of 1 cc of preservative-free saline, 2 cc of 0.2% ropivacaine, 2 cc of Decadron 10 mg/cc. 2.5 cc injected at each level above on the left.   Vitals:   07/16/22 1415 07/16/22 1420 07/16/22 1425 07/16/22 1429  BP: (!) 135/91 (!) 140/92 (!) 143/67 (!) 140/90  Pulse:      Resp: 18 16 15 15   Temp:      SpO2: 100% 100% 100% 100%  Weight:      Height:        Start Time: 1415 hrs. End Time: 1429 hrs.  Imaging Guidance (Spinal):          Type of Imaging Technique: Fluoroscopy Guidance (Spinal) Indication(s): Assistance in needle guidance and placement for procedures requiring needle placement in or near specific anatomical locations not easily accessible without such assistance. Exposure Time: Please see nurses notes. Contrast: Before injecting any contrast, we confirmed that the patient did not have an allergy to iodine, shellfish, or radiological contrast. Once satisfactory needle placement was completed at the desired level, radiological contrast was  injected. Contrast injected under live fluoroscopy. No contrast complications. See chart for type and volume of contrast used. Fluoroscopic Guidance: I was personally present during the use of fluoroscopy. "Tunnel Vision Technique" used to obtain the best possible view of the target area. Parallax error corrected before commencing the procedure. "Direction-depth-direction" technique used to introduce the needle under continuous pulsed fluoroscopy. Once target was reached, antero-posterior, oblique, and lateral fluoroscopic projection used confirm needle placement in all planes. Images permanently stored in EMR. Interpretation: I personally interpreted the imaging intraoperatively. Adequate needle placement confirmed in multiple planes. Appropriate spread of contrast into desired area was observed. No evidence of afferent or efferent intravascular uptake. No intrathecal or subarachnoid spread observed. Permanent images saved into the patient's record.  Post-operative Assessment:  Post-procedure Vital Signs:  Pulse/HCG Rate: 7471 Temp: 98.5 F (36.9 C) Resp: 15 BP: (!) 140/90 SpO2: 100 %  EBL: None  Complications: No immediate post-treatment complications observed by team, or reported by patient.  Note: The patient tolerated the entire procedure well. A repeat set of vitals were taken after the procedure and the patient was kept under observation following institutional policy, for this type of procedure. Post-procedural neurological assessment was performed, showing return to baseline, prior to discharge. The patient was provided with post-procedure discharge instructions, including a section on how to identify potential problems. Should any problems arise concerning this procedure, the patient was given instructions to immediately contact us, at any time, without hesitation. In any case, we plan to contact the patient by telephone for a follow-up status report regarding this interventional  procedure.  Comments:  No additional relevant information.  Plan of Care (POC)  Orders:  Orders Placed This Encounter  Procedures   DG PAIN [facility] C-ARM 1-60 MIN NO REPORT    Intraoperative interpretation by procedural physician at [facility].    Standing Status:   Standing    Number of Occurrences:   1    Order Specific Question:   Reason for exam:    Answer:  Assistance in needle guidance and placement for procedures requiring needle placement in or near specific anatomical locations not easily accessible without such assistance.    Medications ordered for procedure: Meds ordered this encounter  Medications   iohexol (OMNIPAQUE) 180 MG/ML injection 10 mL    Must be Myelogram-compatible. If not available, you may substitute with a water-soluble, non-ionic, hypoallergenic, myelogram-compatible radiological contrast medium.   lidocaine (XYLOCAINE) 2 % (with pres) injection 400 mg   diazepam (VALIUM) tablet 10 mg    Make sure Flumazenil is available in the pyxis when using this medication. If oversedation occurs, administer 0.2 mg IV over 15 sec. If after 45 sec no response, administer 0.2 mg again over 1 min; may repeat at 1 min intervals; not to exceed 4 doses (1 mg)   sodium chloride flush (NS) 0.9 % injection 2 mL    This is for a two (2) level block. Use two (2) syringes and divide content in half.   ropivacaine (PF) 2 mg/mL (0.2%) (NAROPIN) injection 2 mL    This is for a two (2) level block. Use two (2) syringes and divide content in half.   dexamethasone (DECADRON) injection 20 mg    This is for a two (2) level block. Use two (2) syringes and divide content in half.   methylPREDNISolone acetate (DEPO-MEDROL) injection 40 mg   Medications administered: We administered iohexol, lidocaine, diazepam, sodium chloride flush, ropivacaine (PF) 2 mg/mL (0.2%), dexamethasone, and methylPREDNISolone acetate.  See the medical record for exact dosing, route, and time of  administration.  Follow-up plan:   Return in about 4 weeks (around 08/13/2022) for Post Procedure Evaluation, in person.      Recent Visits Date Type Provider Dept  06/18/22 Office Visit Gillis Santa, MD [facility]  05/26/22 Procedure visit Gillis Santa, MD [facility]  05/05/22 Procedure visit Gillis Santa, MD [facility]  04/24/22 Office Visit Gillis Santa, MD [facility]  Showing recent visits within past 90 days and meeting all other requirements Today's Visits Date Type Provider Dept  07/16/22 Procedure visit Gillis Santa, MD [facility]  Showing today's visits and meeting all other requirements Future Appointments Date Type Provider Dept  08/13/22 Appointment Gillis Santa, MD [facility]  Showing future appointments within next 90 days and meeting all other requirements  Disposition: Discharge home  Discharge (Date  Time): 07/16/2022; 1140 hrs.   Primary Care Physician: Yoshito Giovanni, MD Location: [facility] Note by: Gillis Santa, MD (TTS technology used. I apologize for any typographical errors that were not detected and corrected.) Date: 07/16/2022; Time: 2:34 PM  Disclaimer:  Medicine is not an Chief Strategy Officer. The only guarantee in medicine is that nothing is guaranteed. It is important to note that the decision to proceed with this intervention was based on the information collected from the patient. The Data and conclusions were drawn from the patient's questionnaire, the interview, and the physical examination. Because the information was provided in large part by the patient, it cannot be guaranteed that it has not been purposely or unconsciously manipulated. Every effort has been made to obtain as much relevant data as possible for this evaluation. It is important to note that the conclusions that lead to this procedure are derived in large part from the available data.  Always take into account that the treatment will also be dependent on availability of resources and existing treatment guidelines, considered by other Pain Management Practitioners as  being common knowledge and practice, at the time of the intervention. For Medico-Legal purposes, it is also important to point out that variation in procedural techniques and pharmacological choices are the acceptable norm. The indications, contraindications, technique, and results of the above procedure should only be interpreted and judged by a Board-Certified Interventional Pain Specialist with extensive familiarity and expertise in the same exact procedure and technique.

## 2022-07-16 NOTE — Patient Instructions (Signed)
Selective Nerve Root Block ?Patient Information ? ?Description: Specific nerve roots exit the spinal canal and these nerves can be compressed and inflamed by a bulging disc and bone spurs.  By injecting steroids on the nerve root, we can potentially decrease the inflammation surrounding these nerves, which often leads to decreased pain.  Also, by injecting local anesthesia on the nerve root, this can provide us helpful information to give to your referring doctor if it decreases your pain.  Selective nerve root blocks can be done along the spine from the neck to the low back depending on the location of your pain. ?  After numbing the skin with local anesthesia, a small needle is passed to the nerve root and the position of the needle is verified using x-ray pictures.  After the needle is in correct position, we then deposit the medication.  You may experience a pressure sensation while this is being done.  The entire block usually lasts less than 15 minutes. ? ?Conditions that may be treated with selective nerve root blocks: ?Low back and leg pain ?Spinal stenosis ?Diagnostic block prior to potential surgery ?Neck and arm pain ?Post laminectomy syndrome ? ?Preparation for the injection: ? ?Do not eat any solid food or dairy products within 8 hours of your appointment. ?You may drink clear liquids up to 3 hours before an appointment.  Clear liquids include water, black coffee, juice or soda.  No milk or cream please. ?You may take your regular medications, including pain medications, with a sip of water before your appointment.  Diabetics should hold regular insulin (if taken separately) and take 1/2 normal NPH dose the morning of the procedure.  Carry some sugar containing items with you to your appointment. ?A driver must accompany you and be prepared to drive you home after your procedure. ?Bring all your current medications with you. ?An IV may be inserted and sedation may be given at the discretion of the  physician. ?A blood pressure cuff, EKG, and other monitors will often be applied during the procedure.  Some patients may need to have extra oxygen administered for a short period. ?You will be asked to provide medical information, including allergies, prior to the procedure.  We must know immediately if you are taking blood ? ?Thinners ?(like Coumadin) or if you are allergic to IV iodine contrast (dye). ? ?Possible side-effects: ?All are usually temporary ?Bleeding from needle site ?Light headedness ?Numbness and tingling ?Decreased blood pressure ?Weakness in arms/legs ?Pressure sensation in back/neck ?Pain at injection site (several days) ? ?Possible complications: ?All are extremely rare ?Infection ?Nerve injury ?Spinal headache (a headache wore with upright position) ? ?Call if you experience: ?Fever/chills associated with headache or increased back/neck pain ?Headache worsened by an upright position ?New onset weakness or numbness of an extremity below the injection site ?Hives or difficulty breathing (go to the emergency room) ?Inflammation or drainage at the injection site(s) ?Severe back/neck pain greater than usual ?New symptoms which are concerning to you ? ?Please note: ? ?Although the local anesthetic injected can often make your back or neck feel good for several hours after the injection the pain will likely return.  It takes 3-5 days for steroids to work on the nerve root. You may not notice any pain relief for at least one week. ? ?If effective, we will often do a series of 3 injections spaced 3-6 weeks apart to maximally decrease your pain.   ? ?If you have any questions, please call [phone] ?[place]   [facility] ? ?General Discharge Instructions : ? ?If you need to reach your doctor call: Monday-Friday 8:00 am - 4:00 pm at [phone] or toll free [phone].  After clinic hours [phone] to have operator reach  doctor. ? ?Bring all of your medication bottles to all your appointments in the pain clinic. ? ?To cancel or reschedule your appointment with Pain Management please remember to call 24 hours in advance to avoid a fee. ? ?Refer to the educational materials which you have been given on: General Risks, I had my Procedure. Discharge Instructions, Post Sedation. ? ?Post Procedure Instructions: ? ?The drugs you were given will stay in your system until tomorrow, so for the next 24 hours you should not drive, make any legal decisions or drink any alcoholic beverages. ? ?You may eat anything you prefer, but it is better to start with liquids then soups and crackers, and gradually work up to solid foods. ? ?Please notify your doctor immediately if you have any unusual bleeding, trouble breathing or pain that is not related to your normal pain. ? ?Depending on the type of procedure that was done, some parts of your body may feel week and/or numb.  This usually clears up by tonight or the next day. ? ?Walk with the use of an assistive device or accompanied by an adult for the 24 hours. ? ?You may use ice on the affected area for the first 24 hours.  Put ice in a Ziploc bag and cover with a towel and place against area 15 minutes on 15 minutes off.  You may switch to heat after 24 hours. ?

## 2022-07-17 ENCOUNTER — Telehealth: Payer: Self-pay

## 2022-07-17 NOTE — Telephone Encounter (Signed)
Post procedure follow up.  Patient states he is doing well 

## 2022-08-13 ENCOUNTER — Encounter: Payer: Self-pay | Admitting: Student in an Organized Health Care Education/Training Program

## 2022-08-13 ENCOUNTER — Ambulatory Visit
Payer: No Typology Code available for payment source | Attending: Student in an Organized Health Care Education/Training Program | Admitting: Student in an Organized Health Care Education/Training Program

## 2022-08-13 VITALS — BP 120/63 | HR 88 | Temp 98.1°F | Resp 18 | Ht 68.0 in | Wt 232.0 lb

## 2022-08-13 DIAGNOSIS — G8929 Other chronic pain: Secondary | ICD-10-CM | POA: Insufficient documentation

## 2022-08-13 DIAGNOSIS — G5681 Other specified mononeuropathies of right upper limb: Secondary | ICD-10-CM | POA: Insufficient documentation

## 2022-08-13 DIAGNOSIS — S46011S Strain of muscle(s) and tendon(s) of the rotator cuff of right shoulder, sequela: Secondary | ICD-10-CM | POA: Insufficient documentation

## 2022-08-13 DIAGNOSIS — M19019 Primary osteoarthritis, unspecified shoulder: Secondary | ICD-10-CM | POA: Diagnosis present

## 2022-08-13 DIAGNOSIS — M25511 Pain in right shoulder: Secondary | ICD-10-CM | POA: Diagnosis not present

## 2022-08-13 MED ORDER — GABAPENTIN 300 MG PO CAPS: ORAL_CAPSULE | ORAL | 0 refills | Status: AC

## 2022-08-13 NOTE — Patient Instructions (Signed)

## 2022-08-13 NOTE — Progress Notes (Signed)
PROVIDER NOTE: Information contained herein reflects review and annotations entered in association with encounter. Interpretation of such information and data should be left to medically-trained personnel. Information provided to patient can be located elsewhere in the medical record under "Patient Instructions". Document created using STT-dictation technology, any transcriptional errors that may result from process are unintentional.    Patient: Evan Cherry  Service Category: E/M  Provider: Edward Jolly, MD  DOB: [birth year]  DOS: 08/13/2022  Referring Provider: Dellia Cloud, MD  MRN: [id number]  Specialty: Interventional Pain Management  PCP: Dellia Cloud, MD  Type: Established Patient  Setting: Ambulatory outpatient    Location: Office  Delivery: Face-to-face     HPI  Mr. BRACKEN MOFFA, a 57 y.o. year old male, is here today because of his Chronic right shoulder pain [M25.511, G89.29]. Mr. Tillis primary complain today is Shoulder Pain (right)  Pertinent problems: Mr. Coryell has Bilateral post-traumatic osteoarthritis of knee; Chronic pain of both knees; Thoracic facet joint syndrome; Thoracic spondylosis without myelopathy; and Lumbar facet arthropathy on their pertinent problem list. Pain Assessment: Severity of Chronic pain is reported as a 5 /10. Location: Shoulder Right/right upper arm. Onset: More than a month ago. Quality: Aching, Sharp. Timing: Constant. Modifying factor(s): meds, ice. Vitals:  height is  (1.727 m) and weight is 232 lb (105.2 kg). His temporal temperature is 98.1 F (36.7 C). His blood pressure is 120/63 and his pulse is 88. His respiration is 18 and oxygen saturation is 98%.  BMI: Estimated body mass index is 35.28 kg/m as calculated from the following:   Height as of this encounter:  (1.727 m).   Weight as of this encounter: 232 lb (105.2 kg). Last encounter: 06/18/2022. Last procedure: 07/16/2022.  Reason for encounter: post-procedure evaluation and  assessment.   Post-procedure evaluation   Procedure #1: Glenohumeral Joint (shoulder) Injection #1  Laterality: Right (-RT)  Level: Shoulder   Procedure #2 Procedure: Lumbar trans-foraminal epidural steroid injection (L-TFESI) #1  Laterality: Left (-LT)  Level: L3 and L4 nerve root(s) Imaging: Fluoroscopy-guided         Anesthesia: Local anesthesia (1-2% Lidocaine) Anxiolysis:10 mg PO Valium DOS: 07/16/2022  Performed by: Edward Jolly, MD   Purpose: Diagnostic/Therapeutic Indications: Lumbar radicular pain severe enough to impact quality of life or function. 1. Lumbar radicular pain (left L3/4)   2. Chronic right shoulder pain   3. Rupture of right supraspinatus tendon, sequela   4. Chronic pain syndrome       Effectiveness:  Initial hour after procedure: 80 %  Subsequent 4-6 hours post-procedure: 80 %  Analgesia past initial 6 hours: 40 %  Ongoing improvement:  Analgesic:  40% for right shoulder and left leg Function: Somewhat improved ROM: Somewhat improved  ROS  Constitutional: Denies any fever or chills Gastrointestinal: No reported hemesis, hematochezia, vomiting, or acute GI distress Musculoskeletal:  right shoulder pain and right ac pain Neurological: No reported episodes of acute onset apraxia, aphasia, dysarthria, agnosia, amnesia, paralysis, loss of coordination, or loss of consciousness  Medication Review  Benralizumab, Carboxymethylcellulose Sodium, Cyanocobalamin, Dupilumab, EPINEPHrine, HYDROcodone-acetaminophen, Multiple Vitamin, Tiotropium Bromide Monohydrate, acetaminophen, albuterol, azelastine, benazepril, benazepril-hydrochlorthiazide, budesonide-formoterol, cholecalciferol, ciclesonide, cyanocobalamin, cyclobenzaprine, diclofenac Sodium, doxycycline, gabapentin, guaiFENesin, hydrochlorothiazide, lidocaine, loratadine, meloxicam, morphine, naproxen, naproxen sodium, pravastatin, predniSONE, testosterone cypionate, tiZANidine, tiotropium, and  traMADol  History Review  Allergy: Mr. Trudo has No Known Allergies. Drug: Mr. Provence  reports no history of drug use. Alcohol:  reports no history of alcohol use. Tobacco:  reports that he has quit smoking. His smokeless tobacco use includes snuff. Social: Mr. Kahrs  reports that he has quit smoking. His smokeless tobacco use includes snuff. He reports that he does not drink alcohol and does not use drugs. Medical:  has a past medical history of Asthma, Back pain, COPD (chronic obstructive pulmonary disease), Decreased lung capacity, Hypertension, and Sleep apnea. Surgical: Mr. Coward  has no past surgical history on file. Family: family history is not on file. He was adopted.  Laboratory Chemistry Profile   Renal Lab Results  Component Value Date   BUN 15 12/09/2014   CREATININE 0.82 12/09/2014   GFRAA >60 12/09/2014   GFRNONAA >60 12/09/2014    Hepatic Lab Results  Component Value Date   AST 25 12/08/2014   ALT 34 12/08/2014   ALBUMIN 4.4 12/08/2014   ALKPHOS 72 12/08/2014   LIPASE 171 12/26/2011    Electrolytes Lab Results  Component Value Date   NA 142 12/09/2014   K 4.2 12/09/2014   CL 107 12/09/2014   CALCIUM 9.2 12/09/2014    Bone No results found for: "VD25OH", "VD125OH2TOT", "ZO1096EA5", "WU9811BJ4", "25OHVITD1", "25OHVITD2", "25OHVITD3", "TESTOFREE", "TESTOSTERONE"  Inflammation (CRP: Acute Phase) (ESR: Chronic Phase) No results found for: "CRP", "ESRSEDRATE", "LATICACIDVEN"       Note: Above Lab results reviewed.  Recent Imaging Review  DG PAIN CLINIC C-ARM 1-60 MIN NO REPORT Fluoro was used, but no Radiologist interpretation will be provided.  Please refer to "NOTES" tab for provider progress note. Note: Reviewed        Physical Exam  General appearance: Well nourished, well developed, and well hydrated. In no apparent acute distress Mental status: Alert, oriented x 3 (person, place, & time)       Respiratory: No evidence of acute respiratory  distress Eyes: PERLA Vitals: BP 120/63   Pulse 88   Temp 98.1 F (36.7 C) (Temporal)   Resp 18   Ht  (1.727 m)   Wt 232 lb (105.2 kg)   SpO2 98%   BMI 35.28 kg/m  BMI: Estimated body mass index is 35.28 kg/m as calculated from the following:   Height as of this encounter:  (1.727 m).   Weight as of this encounter: 232 lb (105.2 kg). Ideal: Ideal body weight: 68.4 kg (150 lb 12.7 oz) Adjusted ideal body weight: 83.1 kg (183 lb 4.4 oz)  Upper Extremity (UE) Exam    Side: Right upper extremity  Side: Left upper extremity  Skin & Extremity Inspection: Skin color, temperature, and hair growth are WNL. No peripheral edema or cyanosis. No masses, redness, swelling, asymmetry, or associated skin lesions. No contractures.  Skin & Extremity Inspection: Skin color, temperature, and hair growth are WNL. No peripheral edema or cyanosis. No masses, redness, swelling, asymmetry, or associated skin lesions. No contractures.  Functional ROM: Pain restricted ROM for shoulder  Functional ROM: Unrestricted ROM          Muscle Tone/Strength: Functionally intact. No obvious neuro-muscular anomalies detected.  Muscle Tone/Strength: Functionally intact. No obvious neuro-muscular anomalies detected.  Sensory (Neurological): Arthropathic arthralgia          Sensory (Neurological): Unimpaired          Palpation: No palpable anomalies              Palpation: No palpable anomalies              Provocative Test(s):  Phalen's test: deferred Tinel's test: deferred Apley's scratch test (touch opposite shoulder):  Action 1 (Across chest): Decreased ROM Action 2 (Overhead): Decreased ROM Action 3 (LB reach): Decreased ROM   Provocative Test(s):  Phalen's test: deferred Tinel's test: deferred Apley's scratch test (touch opposite shoulder):  Action 1 (Across chest): deferred Action 2 (Overhead): deferred Action 3 (LB reach): deferred     Assessment   Diagnosis Status  1. Chronic right shoulder  pain   2. AC joint arthropathy   3. Rupture of right supraspinatus tendon, sequela   4. Neuropathy of right suprascapular nerve    Improving Persistent Persistent   Updated Problems: Problem  Ac Joint Arthropathy    Plan of Care    1. Chronic right shoulder pain - SHOULDER INJECTION; Future - Medium Joint Injection/Arthrocentesis; Future  2. AC joint arthropathy - SHOULDER INJECTION; Future - Medium Joint Injection/Arthrocentesis; Future  3. Rupture of right supraspinatus tendon, sequela - SHOULDER INJECTION; Future - Medium Joint Injection/Arthrocentesis; Future  4. Neuropathy of right suprascapular nerve - gabapentin (NEURONTIN) 300 MG capsule; Take 1 capsule (300 mg total) by mouth at bedtime for 15 days, THEN 1 capsule (300 mg total) 2 (two) times daily.  Dispense: 105 capsule; Refill: 0    [name] has a current medication list which includes the following long-term medication(s): albuterol, albuterol, azelastine, benazepril, benazepril-hydrochlorthiazide, budesonide-formoterol, gabapentin, hydrochlorothiazide, loratadine, pravastatin, testosterone cypionate, tiotropium, tiotropium bromide monohydrate, and ciclesonide.  Pharmacotherapy (Medications Ordered): Meds ordered this encounter  Medications   gabapentin (NEURONTIN) 300 MG capsule    Sig: Take 1 capsule (300 mg total) by mouth at bedtime for 15 days, THEN 1 capsule (300 mg total) 2 (two) times daily.    Dispense:  105 capsule    Refill:  0   Orders:  Orders Placed This Encounter  Procedures   SHOULDER INJECTION    Standing Status:   Future    Standing Expiration Date:   11/12/2022    Scheduling Instructions:     Procedure: Intra-articular shoulder (Glenohumeral) joint injection     Side: Right      Level: Glenohumeral joint             Posterior    Order Specific Question:   Where will this procedure be performed?    Answer:   [facility]   Medium Joint Injection/Arthrocentesis     Standing Status:   Future    Standing Expiration Date:   08/13/2023    Scheduling Instructions:     Right AC joint injection fluoro   Follow-up plan:   Return in about 4 weeks (around 09/10/2022) for Right posterior shoulder injection and right AC joint injection, in clinic [facility].       B/L IA Knee steroid 12/12/19, 07/29/21: Helpful,  T10, 11, 12 MBB on 12/19/2019 followed by RFA (previosly done with Dr Ollen Bowl in 2018), status post bilateral T10, T11, T12 thoracic facet medial branch nerve block 8/23/ 2021 which provided 75% pain relief for 3-1/2 weeks with gradual return of pain thereafter.  Right T10, T11, T12 RFA on 01/30/2020; left T10, T11, T12 RFA on 02/20/2020, 12/05/20, 07/29/20, Left L4/5 ESI 05/05/22, right C7/T1 ESI and right SSNB 05/26/22        Recent Visits Date Type Provider Dept  07/16/22 Procedure visit Edward Jolly, MD [facility]  06/18/22 Office Visit Edward Jolly, MD [facility]  05/26/22 Procedure visit Edward Jolly, MD [facility]  Showing recent visits within past 90 days and meeting all other requirements Today's Visits Date Type Provider Dept  08/13/22 Office Visit Edward Jolly, MD [facility]  Showing today's visits and meeting all other requirements Future Appointments Date Type Provider Dept  09/10/22 Appointment Edward Jolly, MD [facility]  Showing future appointments within next 90 days and meeting all other requirements  I discussed the assessment and treatment plan with the patient. The patient was provided an opportunity to ask questions and all were answered. The patient agreed with the plan and demonstrated an understanding of the instructions.  Patient advised to call back or seek an in-person evaluation if the symptoms or condition worsens.  Duration of encounter: .  Total time on encounter, as per AMA guidelines included both the face-to-face and non-face-to-face time personally spent by the  physician and/or other qualified health care professional(s) on the day of the encounter (includes time in activities that require the physician or other qualified health care professional and does not include time in activities normally performed by clinical staff). Physician's time may include the following activities when performed: Preparing to see the patient (e.g., pre-charting review of records, searching for previously ordered imaging, lab work, and nerve conduction tests) Review of prior analgesic pharmacotherapies. Reviewing PMP Interpreting ordered tests (e.g., lab work, imaging, nerve conduction tests) Performing post-procedure evaluations, including interpretation of diagnostic procedures Obtaining and/or reviewing separately obtained history Performing a medically appropriate examination and/or evaluation Counseling and educating the patient/family/caregiver Ordering medications, tests, or procedures Referring and communicating with other health care professionals (when not separately reported) Documenting clinical information in the electronic or other health record Independently interpreting results (not separately reported) and communicating results to the patient/ family/caregiver Care coordination (not separately reported)  Note by: Edward Jolly, MD Date: 08/13/2022; Time: 1:15 PM

## 2022-09-10 ENCOUNTER — Other Ambulatory Visit: Payer: Self-pay | Admitting: Student in an Organized Health Care Education/Training Program

## 2022-09-10 ENCOUNTER — Ambulatory Visit (HOSPITAL_BASED_OUTPATIENT_CLINIC_OR_DEPARTMENT_OTHER)
Payer: No Typology Code available for payment source | Admitting: Student in an Organized Health Care Education/Training Program

## 2022-09-10 ENCOUNTER — Encounter: Payer: Self-pay | Admitting: Student in an Organized Health Care Education/Training Program

## 2022-09-10 ENCOUNTER — Ambulatory Visit
Admission: RE | Admit: 2022-09-10 | Discharge: 2022-09-10 | Disposition: A | Discharge status: Home / Self Care | Payer: No Typology Code available for payment source | Source: Ambulatory Visit | Attending: Student in an Organized Health Care Education/Training Program | Admitting: Student in an Organized Health Care Education/Training Program

## 2022-09-10 VITALS — BP 128/87 | HR 83 | Temp 98.1°F | Resp 18 | Ht 68.0 in | Wt 236.0 lb

## 2022-09-10 DIAGNOSIS — G894 Chronic pain syndrome: Secondary | ICD-10-CM | POA: Diagnosis present

## 2022-09-10 DIAGNOSIS — M25511 Pain in right shoulder: Secondary | ICD-10-CM | POA: Diagnosis not present

## 2022-09-10 DIAGNOSIS — G8929 Other chronic pain: Secondary | ICD-10-CM

## 2022-09-10 DIAGNOSIS — M19019 Primary osteoarthritis, unspecified shoulder: Secondary | ICD-10-CM | POA: Insufficient documentation

## 2022-09-10 MED ORDER — ROPIVACAINE HCL 2 MG/ML IJ SOLN
9.0000 mL | Freq: Once | INTRAMUSCULAR | Status: AC
  Administered 2022-09-10: 9 mL via PERINEURAL

## 2022-09-10 MED ORDER — IOHEXOL 180 MG/ML  SOLN
INTRAMUSCULAR | Status: AC
  Filled 2022-09-10: qty 20

## 2022-09-10 MED ORDER — ROPIVACAINE HCL 2 MG/ML IJ SOLN
INTRAMUSCULAR | Status: AC
  Filled 2022-09-10: qty 20

## 2022-09-10 MED ORDER — METHYLPREDNISOLONE ACETATE 80 MG/ML IJ SUSP
INTRAMUSCULAR | Status: AC
  Filled 2022-09-10: qty 1

## 2022-09-10 MED ORDER — LIDOCAINE HCL 2 % IJ SOLN
20.0000 mL | Freq: Once | INTRAMUSCULAR | Status: AC
  Administered 2022-09-10: 100 mg

## 2022-09-10 MED ORDER — METHYLPREDNISOLONE ACETATE 80 MG/ML IJ SUSP
80.0000 mg | Freq: Once | INTRAMUSCULAR | Status: AC
  Administered 2022-09-10: 80 mg via INTRA_ARTICULAR

## 2022-09-10 MED ORDER — LIDOCAINE HCL (PF) 2 % IJ SOLN
INTRAMUSCULAR | Status: AC
  Filled 2022-09-10: qty 10

## 2022-09-10 NOTE — Patient Instructions (Signed)

## 2022-09-10 NOTE — Progress Notes (Signed)
PROVIDER NOTE: Interpretation of information contained herein should be left to medically-trained personnel. Specific patient instructions are provided elsewhere under "Patient Instructions" section of medical record. This document was created in part using STT-dictation technology, any transcriptional errors that may result from this process are unintentional.  Patient: Evan Cherry Type: Established DOB: Jul 29, 1965 MRN: [id number] PCP: Dellia Cloud, MD  Service: Procedure DOS: 09/10/2022 Setting: Ambulatory Location: Ambulatory outpatient facility Delivery: Face-to-face Provider: Edward Jolly, MD Specialty: Interventional Pain Management Specialty designation: 09 Location: Outpatient facility Ref. Prov.: Dellia Cloud, MD       Interventional Therapy   Procedure: Glenohumeral Joint (shoulder) and acromioclavicular joint Injection Laterality: Right (-RT)  Level: Shoulder   Imaging: Fluoroscopy-guided         Anesthesia: Local anesthesia (1-2% Lidocaine) DOS: 09/10/2022  Performed by: Edward Jolly, MD  Purpose: Diagnostic/Therapeutic Indications: Shoulder pain severe enough to impact quality of life or function. Rationale (medical necessity): procedure needed and proper for the diagnosis and/or treatment of Mr. Roehl medical symptoms and needs. Right shoulder pain, right shoulder osteoarthritis NAS-11 Pain score:   Pre-procedure: 5 /10   Post-procedure: 4 /10      Target: Glenohumeral Joint (shoulder) Location: Intra-articular  Region: Entire Shoulder Area Approach: Posterior approach. Type of procedure: Percutaneous joint injection   Position  Prep  Materials:  Position: Supine Prep solution: DuraPrep (Iodine Povacrylex [0.7% available iodine] and Isopropyl Alcohol, 74% w/w) Prep Area: Entire shoulder Area Materials:  Tray: Block Needle(s):  Type: Spinal  Gauge (G): 22  Length: 3.5-in  Qty: 1  Pre-op H&P Assessment:  Mr. Vardeman is a 57 y.o. (year old),  male patient, seen today for interventional treatment. He  has no past surgical history on file. Mr. Balyeat has a current medication list which includes the following prescription(s): acetaminophen, albuterol, albuterol, azelastine, benazepril, fasenra, budesonide-formoterol, cyanocobalamin, diclofenac sodium, epinephrine, gabapentin, guaifenesin, hydrochlorothiazide, loratadine, morphine, naproxen, testosterone cypionate, tiotropium, tiotropium bromide monohydrate, tramadol, benazepril-hydrochlorthiazide, carboxymethylcellulose sodium, cholecalciferol, ciclesonide, cyanocobalamin, cyclobenzaprine, doxycycline, dupilumab, hydrocodone-acetaminophen, hydrocodone-acetaminophen, lidocaine, meloxicam, multiple vitamin, naproxen sodium, pravastatin, prednisone, and tizanidine. His primarily concern today is the Shoulder Pain (Right )  Initial Vital Signs:  Pulse/HCG Rate: 83ECG Heart Rate: 95 (nsr) Temp: 98.1 F (36.7 C) Resp: 16 BP: (!) 146/71 SpO2: 98 %  BMI: Estimated body mass index is 35.88 kg/m as calculated from the following:   Height as of this encounter: 5\' 8"  (1.727 m).   Weight as of this encounter: 236 lb (107 kg).  Risk Assessment: Allergies: Reviewed. He has No Known Allergies.  Allergy Precautions: None required Coagulopathies: Reviewed. None identified.  Blood-thinner therapy: None at this time Active Infection(s): Reviewed. None identified. Mr. Kovac is afebrile  Site Confirmation: Mr. Debruyn was asked to confirm the procedure and laterality before marking the site Procedure checklist: Completed Consent: Before the procedure and under the influence of no sedative(s), amnesic(s), or anxiolytics, the patient was informed of the treatment options, risks and possible complications. To fulfill our ethical and legal obligations, as recommended by the American Medical Association's Code of Ethics, I have informed the patient of my clinical impression; the nature and purpose of the treatment  or procedure; the risks, benefits, and possible complications of the intervention; the alternatives, including doing nothing; the risk(s) and benefit(s) of the alternative treatment(s) or procedure(s); and the risk(s) and benefit(s) of doing nothing. The patient was provided information about the general risks and possible complications associated with the procedure. These may include, but are not limited to: failure  to achieve desired goals, infection, bleeding, organ or nerve damage, allergic reactions, paralysis, and death. In addition, the patient was informed of those risks and complications associated to the procedure, such as failure to decrease pain; infection; bleeding; organ or nerve damage with subsequent damage to sensory, motor, and/or autonomic systems, resulting in permanent pain, numbness, and/or weakness of one or several areas of the body; allergic reactions; (i.e.: anaphylactic reaction); and/or death. Furthermore, the patient was informed of those risks and complications associated with the medications. These include, but are not limited to: allergic reactions (i.e.: anaphylactic or anaphylactoid reaction(s)); adrenal axis suppression; blood sugar elevation that in diabetics may result in ketoacidosis or comma; water retention that in patients with history of congestive heart failure may result in shortness of breath, pulmonary edema, and decompensation with resultant heart failure; weight gain; swelling or edema; medication-induced neural toxicity; particulate matter embolism and blood vessel occlusion with resultant organ, and/or nervous system infarction; and/or aseptic necrosis of one or more joints. Finally, the patient was informed that Medicine is not an exact science; therefore, there is also the possibility of unforeseen or unpredictable risks and/or possible complications that may result in a catastrophic outcome. The patient indicated having understood very clearly. We have given  the patient no guarantees and we have made no promises. Enough time was given to the patient to ask questions, all of which were answered to the patient's satisfaction. Mr. Willitts has indicated that he wanted to continue with the procedure. Attestation: I, the ordering provider, attest that I have discussed with the patient the benefits, risks, side-effects, alternatives, likelihood of achieving goals, and potential problems during recovery for the procedure that I have provided informed consent. Date  Time: 09/10/2022  9:31 AM   Imaging Guidance (Non-Spinal):          Type of Imaging Technique: Fluoroscopy Guidance (Non-Spinal) Indication(s): Assistance in needle guidance and placement for procedures requiring needle placement in or near specific anatomical locations not easily accessible without such assistance. Exposure Time: Please see nurses notes. Contrast: Before injecting any contrast, we confirmed that the patient did not have an allergy to iodine, shellfish, or radiological contrast. Once satisfactory needle placement was completed at the desired level, radiological contrast was injected. Contrast injected under live fluoroscopy. No contrast complications. See chart for type and volume of contrast used. Fluoroscopic Guidance: I was personally present during the use of fluoroscopy. "Tunnel Vision Technique" used to obtain the best possible view of the target area. Parallax error corrected before commencing the procedure. "Direction-depth-direction" technique used to introduce the needle under continuous pulsed fluoroscopy. Once target was reached, antero-posterior, oblique, and lateral fluoroscopic projection used confirm needle placement in all planes. Images permanently stored in EMR. Interpretation: I personally interpreted the imaging intraoperatively. Adequate needle placement confirmed in multiple planes. Appropriate spread of contrast into desired area was observed. No evidence of afferent or  efferent intravascular uptake. Permanent images saved into the patient's record.  Pre-Procedure Preparation:  Monitoring: As per clinic protocol. Respiration, ETCO2, SpO2, BP, heart rate and rhythm monitor placed and checked for adequate function Safety Precautions: Patient was assessed for positional comfort and pressure points before starting the procedure. Time-out: I initiated and conducted the "Time-out" before starting the procedure, as per protocol. The patient was asked to participate by confirming the accuracy of the "Time Out" information. Verification of the correct person, site, and procedure were performed and confirmed by me, the nursing staff, and the patient. "Time-out" conducted as per Joint Commission's  Universal Protocol (UP.01.01.01). Time: 40  Description  Narrative of Procedure:          Rationale (medical necessity): procedure needed and proper for the diagnosis and/or treatment of the patient's medical symptoms and needs. Procedural Technique Safety Precautions: Aspiration looking for blood return was conducted prior to all injections. At no point did we inject any substances, as a needle was being advanced. No attempts were made at seeking any paresthesias. Safe injection practices and needle disposal techniques used. Medications properly checked for expiration dates. SDV (single dose vial) medications used. Description of the Procedure: Protocol guidelines were followed. The patient was placed in position over the procedure table. The target area was identified and the area prepped in the usual manner. Skin & deeper tissues infiltrated with local anesthetic. Appropriate amount of time allowed to pass for local anesthetics to take effect. The procedure needles were then advanced to the target area. Proper needle placement secured. Negative aspiration confirmed. Solution injected in intermittent fashion, asking for systemic symptoms every 0.5cc of injectate. The needles were  then removed and the area cleansed, making sure to leave some of the prepping solution back to take advantage of its long term bactericidal properties.  5 cc solution made of 4.5 cc of 0.2% ropivacaine, 0.5 cc of methylprednisolone, 80 mg/cc.  Injected into the right glenohumeral joint after contrast confirmation  Afterwards, a right acromioclavicular joint injection was also performed. 5 cc solution made of 4.5 cc of 0.2% ropivacaine, 0.5 cc of methylprednisolone, 80 mg/cc.  Injected into the right [facility] joint after contrast confirmation              Vitals:   09/10/22 1033 09/10/22 1041 09/10/22 1046 09/10/22 1053  BP: (!) 160/104 (!) 151/102 (!) 153/106 128/87  Pulse:      Resp: 15 16 15 18   Temp:      TempSrc:      SpO2: 97% 97% 99% 98%  Weight:      Height:         Start Time: 1043 hrs. End Time: 1052 hrs.  Antibiotic Prophylaxis:   Anti-infectives (From admission, onward)    None      Indication(s): None identified  Post-operative Assessment:  Post-procedure Vital Signs:  Pulse/HCG Rate: 8378 Temp: 98.1 F (36.7 C) Resp: 18 BP: 128/87 SpO2: 98 %  EBL: None  Complications: No immediate post-treatment complications observed by team, or reported by patient.  Note: The patient tolerated the entire procedure well. A repeat set of vitals were taken after the procedure and the patient was kept under observation following institutional policy, for this type of procedure. Post-procedural neurological assessment was performed, showing return to baseline, prior to discharge. The patient was provided with post-procedure discharge instructions, including a section on how to identify potential problems. Should any problems arise concerning this procedure, the patient was given instructions to immediately contact us, at any time, without hesitation. In any case, we plan to contact the patient by telephone for a follow-up status report regarding this interventional  procedure.  Comments:  No additional relevant information.  Plan of Care (POC)  Orders:  No orders of the defined types were placed in this encounter.    Medications ordered for procedure: Meds ordered this encounter  Medications   lidocaine (XYLOCAINE) 2 % (with pres) injection 400 mg   ropivacaine (PF) 2 mg/mL (0.2%) (NAROPIN) injection 9 mL   methylPREDNISolone acetate (DEPO-MEDROL) injection 80 mg   Medications administered: We administered lidocaine, ropivacaine (PF)  2 mg/mL (0.2%), and methylPREDNISolone acetate.  See the medical record for exact dosing, route, and time of administration.  Follow-up plan:   Return in about 29 days (around 10/09/2022) for Post Procedure Evaluation, in person.       Recent Visits Date Type Provider Dept  08/13/22 Office Visit Edward Jolly, MD [facility]  07/16/22 Procedure visit Edward Jolly, MD [facility]  06/18/22 Office Visit Edward Jolly, MD [facility]  Showing recent visits within past 90 days and meeting all other requirements Today's Visits Date Type Provider Dept  09/10/22 Procedure visit Edward Jolly, MD [facility]  Showing today's visits and meeting all other requirements Future Appointments Date Type Provider Dept  10/09/22 Appointment Edward Jolly, MD [facility]  Showing future appointments within next 90 days and meeting all other requirements  Disposition: Discharge home  Discharge (Date  Time): 09/10/2022; 1102 hrs.   Primary Care Physician: Dellia Cloud, MD Location: [facility] Note by: Edward Jolly, MD (TTS technology used. I apologize for any typographical errors that were not detected and corrected.) Date: 09/10/2022; Time: 2:30 PM  Disclaimer:  Medicine is not an Visual merchandiser. The only guarantee in medicine is that nothing is guaranteed. It is important to note that the decision to proceed with this intervention was  based on the information collected from the patient. The Data and conclusions were drawn from the patient's questionnaire, the interview, and the physical examination. Because the information was provided in large part by the patient, it cannot be guaranteed that it has not been purposely or unconsciously manipulated. Every effort has been made to obtain as much relevant data as possible for this evaluation. It is important to note that the conclusions that lead to this procedure are derived in large part from the available data. Always take into account that the treatment will also be dependent on availability of resources and existing treatment guidelines, considered by other Pain Management Practitioners as being common knowledge and practice, at the time of the intervention. For Medico-Legal purposes, it is also important to point out that variation in procedural techniques and pharmacological choices are the acceptable norm. The indications, contraindications, technique, and results of the above procedure should only be interpreted and judged by a Board-Certified Interventional Pain Specialist with extensive familiarity and expertise in the same exact procedure and technique.

## 2022-09-10 NOTE — Progress Notes (Signed)
Safety precautions to be maintained throughout the outpatient stay will include: orient to surroundings, keep bed in low position, maintain call bell within reach at all times, provide assistance with transfer out of bed and ambulation.  

## 2022-09-11 ENCOUNTER — Telehealth: Payer: Self-pay | Admitting: *Deleted

## 2022-09-11 NOTE — Telephone Encounter (Signed)
No problems post procedure. 

## 2022-10-08 ENCOUNTER — Encounter: Payer: Self-pay | Admitting: Student in an Organized Health Care Education/Training Program

## 2022-10-08 ENCOUNTER — Ambulatory Visit
Payer: No Typology Code available for payment source | Attending: Student in an Organized Health Care Education/Training Program | Admitting: Student in an Organized Health Care Education/Training Program

## 2022-10-08 VITALS — BP 123/82 | HR 102 | Temp 98.2°F | Resp 18 | Ht 68.0 in | Wt 236.0 lb

## 2022-10-08 DIAGNOSIS — M47894 Other spondylosis, thoracic region: Secondary | ICD-10-CM | POA: Insufficient documentation

## 2022-10-08 DIAGNOSIS — M47814 Spondylosis without myelopathy or radiculopathy, thoracic region: Secondary | ICD-10-CM | POA: Diagnosis not present

## 2022-10-08 DIAGNOSIS — M5416 Radiculopathy, lumbar region: Secondary | ICD-10-CM | POA: Insufficient documentation

## 2022-10-08 DIAGNOSIS — G894 Chronic pain syndrome: Secondary | ICD-10-CM | POA: Insufficient documentation

## 2022-10-08 NOTE — Progress Notes (Signed)
PROVIDER NOTE: Information contained herein reflects review and annotations entered in association with encounter. Interpretation of such information and data should be left to medically-trained personnel. Information provided to patient can be located elsewhere in the medical record under "Patient Instructions". Document created using STT-dictation technology, any transcriptional errors that may result from process are unintentional.    Patient: Evan Cherry  Service Category: E/M  Provider: Edward Jolly, MD  DOB: 05-08-1965  DOS: 10/08/2022  Referring Provider: Dellia Cloud, MD  MRN: [id number]  Specialty: Interventional Pain Management  PCP: Dellia Cloud, MD  Type: Established Patient  Setting: Ambulatory outpatient    Location: Office  Delivery: Face-to-face     HPI  [name], a 57 y.o. year old male, is here today because of his Thoracic facet joint syndrome [M47.894]. [name] primary complain today is Shoulder Pain (left), Acquired Absence Of Right Breast, Knee Pain (bilat), Back Pain, and Neck Pain  Pertinent problems: [name] has Bilateral post-traumatic osteoarthritis of knee; Chronic pain of both knees; Thoracic facet joint syndrome; Thoracic spondylosis without myelopathy; and Lumbar facet arthropathy on their pertinent problem list. Pain Assessment: Severity of Chronic pain is reported as a 6 /10. Location: Shoulder Right/down right arm to wrist. Onset: More than a month ago. Quality: Aching, Constant, Sharp, Discomfort. Timing: Constant. Modifying factor(s): meds, ice, topicals. Vitals:  height is 5\' 8"  (1.727 m) and weight is 236 lb (107 kg). His temporal temperature is 98.2 F (36.8 C). His blood pressure is 123/82 and his pulse is 102 (abnormal). His respiration is 18 and oxygen saturation is 97%.  BMI: Estimated body mass index is 35.88 kg/m as calculated from the following:   Height as of this encounter: 5\' 8"  (1.727 m).   Weight as of this encounter: 236 lb  (107 kg). Last encounter: 08/13/2022. Last procedure: 09/10/2022.  Reason for encounter: post-procedure evaluation and assessment.  Patient notes benefit after posterior glenohumeral joint injection and acromioclavicular joint injection.  He states that the [facility] joint injection did result in some discomfort along his trapezius region for 2 days after the procedure but then it improved.  He states that compared to the suprascapular nerve block and anterior glenohumeral joint injection, the posterior 1 has provided the greatest benefit.  He continues to endorse mid back pain.  It has been greater than 1 year since his thoracic RFA.  We discussed repeating that for the left side to help target his left thoracic back pain and also considering a T12-L1 ESI to help target his left hip and lateral thigh pain.  ROS  Constitutional: Denies any fever or chills Gastrointestinal: No reported hemesis, hematochezia, vomiting, or acute GI distress Musculoskeletal:  Thoracic back pain, left greater than right, radiating to left hip and left lateral thigh Neurological: No reported episodes of acute onset apraxia, aphasia, dysarthria, agnosia, amnesia, paralysis, loss of coordination, or loss of consciousness  Medication Review  Benralizumab, Carboxymethylcellulose Sodium, Cyanocobalamin, Dupilumab, EPINEPHrine, HYDROcodone-acetaminophen, Multiple Vitamin, Tiotropium Bromide Monohydrate, acetaminophen, albuterol, azelastine, benazepril, benazepril-hydrochlorthiazide, budesonide-formoterol, cholecalciferol, ciclesonide, cyanocobalamin, cyclobenzaprine, diclofenac Sodium, doxycycline, gabapentin, guaiFENesin, hydrochlorothiazide, lidocaine, loratadine, meloxicam, morphine, naproxen, naproxen sodium, pravastatin, predniSONE, testosterone cypionate, tiZANidine, tiotropium, and traMADol  History Review  Allergy: [name] has No Known Allergies. Drug: [name]  reports no history of drug use. Alcohol:  reports no history of  alcohol use. Tobacco:  reports that he has quit smoking. His smokeless tobacco use includes snuff. Social: [name]  reports that he has quit  smoking. His smokeless tobacco use includes snuff. He reports that he does not drink alcohol and does not use drugs. Medical:  has a past medical history of Asthma, Back pain, COPD (chronic obstructive pulmonary disease) (HCC), Decreased lung capacity, Hypertension, and Sleep apnea. Surgical: [name]  has no past surgical history on file. Family: family history is not on file. He was adopted.  Laboratory Chemistry Profile   Renal Lab Results  Component Value Date   BUN 15 12/09/2014   CREATININE 0.82 12/09/2014   GFRAA >60 12/09/2014   GFRNONAA >60 12/09/2014    Hepatic Lab Results  Component Value Date   AST 25 12/08/2014   ALT 34 12/08/2014   ALBUMIN 4.4 12/08/2014   ALKPHOS 72 12/08/2014   LIPASE 171 12/26/2011    Electrolytes Lab Results  Component Value Date   NA 142 12/09/2014   K 4.2 12/09/2014   CL 107 12/09/2014   CALCIUM 9.2 12/09/2014    Bone No results found for: "VD25OH", "VD125OH2TOT", "WU9811BJ4", "NW2956OZ3", "25OHVITD1", "25OHVITD2", "25OHVITD3", "TESTOFREE", "TESTOSTERONE"  Inflammation (CRP: Acute Phase) (ESR: Chronic Phase) No results found for: "CRP", "ESRSEDRATE", "LATICACIDVEN"       Note: Above Lab results reviewed.   Physical Exam  General appearance: Well nourished, well developed, and well hydrated. In no apparent acute distress Mental status: Alert, oriented x 3 (person, place, & time)       Respiratory: No evidence of acute respiratory distress Eyes: PERLA Vitals: BP 123/82 (BP Location: Right Arm, Patient Position: Sitting, Cuff Size: Large)   Pulse (!) 102   Temp 98.2 F (36.8 C) (Temporal)   Resp 18   Ht 5\' 8"  (1.727 m)   Wt 236 lb (107 kg)   SpO2 97%   BMI 35.88 kg/m  BMI: Estimated body mass index is 35.88 kg/m as calculated from the following:   Height as of this encounter: 5\' 8"   (1.727 m).   Weight as of this encounter: 236 lb (107 kg). Ideal: Ideal body weight: 68.4 kg (150 lb 12.7 oz) Adjusted ideal body weight: 83.9 kg (184 lb 14 oz)  Improvement in right shoulder pain and range of motion  Midthoracic pain, tender to palpation, left greater than right.    Assessment   Diagnosis Status  1. Thoracic facet joint syndrome   2. Thoracic spondylosis without myelopathy   3. Lumbar radicular pain (left L3/4)   4. Chronic pain syndrome    Persistent Persistent Controlled   Updated Problems: No problems updated.  Plan of Care    Mr. HRISTOS BUBIER has a current medication list which includes the following long-term medication(s): albuterol, albuterol, azelastine, benazepril, benazepril-hydrochlorthiazide, budesonide-formoterol, gabapentin, hydrochlorothiazide, loratadine, testosterone cypionate, tiotropium, tiotropium bromide monohydrate, ciclesonide, and pravastatin.   Orders:  Orders Placed This Encounter  Procedures   Radiofrequency,Thoracic    Standing Status:   Future    Standing Expiration Date:   01/08/2023    Scheduling Instructions:     Side(s): LEFT     Level(s): T10, T11, T12  Medial Branch Nerve(s)     Sedation: With Sedation     Scheduling Timeframe: As soon as pre-approved    Order Specific Question:   Where will this procedure be performed?    Answer:   [facility]   Lumbar Epidural Injection    Standing Status:   Future    Standing Expiration Date:   01/08/2023    Scheduling Instructions:     Procedure: Interlaminar Lumbar Epidural Steroid injection (LESI)  Laterality: Midline     Sedation: with     Timeframe: ASAA    Order Specific Question:   Where will this procedure be performed?    Answer:   [facility]   Follow-up plan:   Return in about 19 days (around 10/27/2022) for Left T10, T11, T12 RFA + Left T12/L1 ESI, in clinic IV Versed.      B/L IA Knee steroid 12/12/19, 07/29/21: Helpful,  T10, 11, 12 MBB  on 12/19/2019 followed by RFA (previosly done with Dr Ollen Bowl in 2018), status post bilateral T10, T11, T12 thoracic facet medial branch nerve block 8/23/ 2021 which provided 75% pain relief for 3-1/2 weeks with gradual return of pain thereafter.  Right T10, T11, T12 RFA on 01/30/2020; left T10, T11, T12 RFA on 02/20/2020, 12/05/20, 07/29/20, Left L4/5 ESI 05/05/22, right C7/T1 ESI and right SSNB 05/26/22    Recent Visits Date Type Provider Dept  09/10/22 Procedure visit Edward Jolly, MD [facility]  08/13/22 Office Visit Edward Jolly, MD [facility]  07/16/22 Procedure visit Edward Jolly, MD [facility]  Showing recent visits within past 90 days and meeting all other requirements Today's Visits Date Type Provider Dept  10/08/22 Office Visit Edward Jolly, MD [facility]  Showing today's visits and meeting all other requirements Future Appointments No visits were found meeting these conditions. Showing future appointments within next 90 days and meeting all other requirements  I discussed the assessment and treatment plan with the patient. The patient was provided an opportunity to ask questions and all were answered. The patient agreed with the plan and demonstrated an understanding of the instructions.  Patient advised to call back or seek an in-person evaluation if the symptoms or condition worsens.  Duration of encounter: .  Total time on encounter, as per AMA guidelines included both the face-to-face and non-face-to-face time personally spent by the physician and/or other qualified health care professional(s) on the day of the encounter (includes time in activities that require the physician or other qualified health care professional and does not include time in activities normally performed by clinical staff). Physician's time may include the following activities when performed: Preparing to see the patient (e.g., pre-charting review of  records, searching for previously ordered imaging, lab work, and nerve conduction tests) Review of prior analgesic pharmacotherapies. Reviewing PMP Interpreting ordered tests (e.g., lab work, imaging, nerve conduction tests) Performing post-procedure evaluations, including interpretation of diagnostic procedures Obtaining and/or reviewing separately obtained history Performing a medically appropriate examination and/or evaluation Counseling and educating the patient/family/caregiver Ordering medications, tests, or procedures Referring and communicating with other health care professionals (when not separately reported) Documenting clinical information in the electronic or other health record Independently interpreting results (not separately reported) and communicating results to the patient/ family/caregiver Care coordination (not separately reported)  Note by: Edward Jolly, MD Date: 10/08/2022; Time: 3:31 PM

## 2022-10-08 NOTE — Progress Notes (Signed)
Safety precautions to be maintained throughout the outpatient stay will include: orient to surroundings, keep bed in low position, maintain call bell within reach at all times, provide assistance with transfer out of bed and ambulation.  

## 2022-10-08 NOTE — Patient Instructions (Signed)
Moderate Conscious Sedation, Adult Sedation is the use of medicines to help you relax and not feel pain. Moderate conscious sedation is a type of sedation that makes you less alert than normal. You are still able to respond to instructions, touch, or both. This type of sedation is used during short medical and dental procedures. It is milder than deep sedation, which is a type of sedation you cannot be easily woken up from. It is also milder than general anesthesia, which is the use of medicines to make you fall asleep. Moderate conscious sedation lets you return to your normal activities sooner. Tell a health care provider about: Any allergies you have. All medicines you are taking, including vitamins, herbs, steroids, eye drops, creams, and over-the-counter medicines. Any problems you or family members have had with anesthesia. Any bleeding problems you have. Any surgeries you have had. Any medical conditions you have. Whether you are pregnant or may be pregnant. Any recent alcohol, tobacco, or drug use. What are the risks? Your health care provider will talk with you about risks. These may include: Oversedation. This is when you get too much medicine. Nausea or vomiting. Allergic reaction to medicines. Trouble breathing. If this happens, a breathing tube may be used. It will be removed when you can breathe better on your own. Heart trouble. Lung trouble. Emergence delirium. This is when you feel confused while the sedation wears off. This gets better with time. What happens before the procedure? When to stop eating and drinking Follow instructions from your health care provider about what you may eat and drink. These may include: 8 hours before your procedure Stop eating most foods. Do not eat meat, fried foods, or fatty foods. Eat only light foods, such as toast or crackers. All liquids are okay except energy drinks and alcohol. 6 hours before your procedure Stop eating. Drink only  clear liquids, such as water, clear fruit juice, black coffee, plain tea, and sports drinks. Do not drink energy drinks or alcohol. 2 hours before your procedure Stop drinking all liquids. You may be allowed to take medicines with small sips of water. If you do not follow your health care provider's instructions, your procedure may be delayed or canceled. Medicines Ask your health care provider about: Changing or stopping your regular medicines. These include any diabetes medicines or blood thinners you take. Taking medicines such as aspirin and ibuprofen. These medicines can thin your blood. Do not take them unless your health care provider tells you to. Taking over-the-counter medicines, vitamins, herbs, and supplements. Tests and exams You may have an exam or testing. You may have a blood or urine sample taken. General instructions Do not use any products that contain nicotine or tobacco for at least 4 weeks before the procedure. These products include cigarettes, chewing tobacco, and vaping devices, such as e-cigarettes. If you need help quitting, ask your health care provider. If you will be going home right after the procedure, plan to have a responsible adult: Take you home from the hospital or clinic. You will not be allowed to drive. Care for you for the time you are told. What happens during the procedure?  You will be given the sedative. It may be given: As a pill you can take by mouth. It can also be put into the rectum. As a spray through the nose. As an injection into muscle. As an injection into a vein through an IV. You may be given oxygen as needed. Your blood pressure, heart  rate, breathing rate, and blood oxygen level will be monitored during the procedure. The medical or dental procedure will be done. The procedure may vary among health care providers and hospitals. What happens after the procedure? Your blood pressure, heart rate, breathing rate, and blood oxygen  level will be monitored until you leave the hospital or clinic. You will get fluids through an IV as needed. Do not drive or operate machinery until your health care provider says that it is safe. This information is not intended to replace advice given to you by your health care provider. Make sure you discuss any questions you have with your health care provider. Document Revised: 10/28/2021 Document Reviewed: 10/28/2021 Elsevier Patient Education  2024 [employer] GENERAL RISKS AND COMPLICATIONS  What are the risk, side effects and possible complications? Generally speaking, most procedures are safe.  However, with any procedure there are risks, side effects, and the possibility of complications.  The risks and complications are dependent upon the sites that are lesioned, or the type of nerve block to be performed.  The closer the procedure is to the spine, the more serious the risks are.  Great care is taken when placing the radio frequency needles, block needles or lesioning probes, but sometimes complications can occur. Infection: Any time there is an injection through the skin, there is a risk of infection.  This is why sterile conditions are used for these blocks.  There are four possible types of infection. Localized skin infection. Central Nervous System Infection-This can be in the form of Meningitis, which can be deadly. Epidural Infections-This can be in the form of an epidural abscess, which can cause pressure inside of the spine, causing compression of the spinal cord with subsequent paralysis. This would require an emergency surgery to decompress, and there are no guarantees that the patient would recover from the paralysis. Discitis-This is an infection of the intervertebral discs.  It occurs in about 1% of discography procedures.  It is difficult to treat and it may lead to surgery.        2. Pain: the needles have to go through skin and soft tissues, will cause soreness.        3. Damage to internal structures:  The nerves to be lesioned may be near blood vessels or    other nerves which can be potentially damaged.       4. Bleeding: Bleeding is more common if the patient is taking blood thinners such as  aspirin, Coumadin, Ticiid, Plavix, etc., or if he/she have some genetic predisposition  such as hemophilia. Bleeding into the spinal canal can cause compression of the spinal  cord with subsequent paralysis.  This would require an emergency surgery to  decompress and there are no guarantees that the patient would recover from the  paralysis.       5. Pneumothorax:  Puncturing of a lung is a possibility, every time a needle is introduced in  the area of the chest or upper back.  Pneumothorax refers to free air around the  collapsed lung(s), inside of the thoracic cavity (chest cavity).  Another two possible  complications related to a similar event would include: Hemothorax and Chylothorax.   These are variations of the Pneumothorax, where instead of air around the collapsed  lung(s), you may have blood or chyle, respectively.       6. Spinal headaches: They may occur with any procedures in the area of the spine.  7. Persistent CSF (Cerebro-Spinal Fluid) leakage: This is a rare problem, but may occur  with prolonged intrathecal or epidural catheters either due to the formation of a fistulous  track or a dural tear.       8. Nerve damage: By working so close to the spinal cord, there is always a possibility of  nerve damage, which could be as serious as a permanent spinal cord injury with  paralysis.       9. Death:  Although rare, severe deadly allergic reactions known as "Anaphylactic  reaction" can occur to any of the medications used.      10. Worsening of the symptoms:  We can always make thing worse.  What are the chances of something like this happening? Chances of any of this occuring are extremely low.  By statistics, you have more of a chance of getting killed in a  motor vehicle accident: while driving to the hospital than any of the above occurring .  Nevertheless, you should be aware that they are possibilities.  In general, it is similar to taking a shower.  Everybody knows that you can slip, hit your head and get killed.  Does that mean that you should not shower again?  Nevertheless always keep in mind that statistics do not mean anything if you happen to be on the wrong side of them.  Even if a procedure has a 1 (one) in a 1,000,000 (million) chance of going wrong, it you happen to be that one..Also, keep in mind that by statistics, you have more of a chance of having something go wrong when taking medications.  Who should not have this procedure? If you are on a blood thinning medication (e.g. Coumadin, Plavix, see list of "Blood Thinners"), or if you have an active infection going on, you should not have the procedure.  If you are taking any blood thinners, please inform your physician.  How should I prepare for this procedure? Do not eat or drink anything at least six hours prior to the procedure. Bring a driver with you .  It cannot be a taxi. Come accompanied by an adult that can drive you back, and that is strong enough to help you if your legs get weak or numb from the local anesthetic. Take all of your medicines the morning of the procedure with just enough water to swallow them. If you have diabetes, make sure that you are scheduled to have your procedure done first thing in the morning, whenever possible. If you have diabetes, take only half of your insulin dose and notify our nurse that you have done so as soon as you arrive at the clinic. If you are diabetic, but only take blood sugar pills (oral hypoglycemic), then do not take them on the morning of your procedure.  You may take them after you have had the procedure. Do not take aspirin or any aspirin-containing medications, at least eleven (11) days prior to the procedure.  They may prolong  bleeding. Wear loose fitting clothing that may be easy to take off and that you would not mind if it got stained with Betadine or blood. Do not wear any jewelry or perfume Remove any nail coloring.  It will interfere with some of our monitoring equipment.  NOTE: Remember that this is not meant to be interpreted as a complete list of all possible complications.  Unforeseen problems may occur.  BLOOD THINNERS The following drugs contain aspirin or other products, which can cause  increased bleeding during surgery and should not be taken for 2 weeks prior to and 1 week after surgery.  If you should need take something for relief of minor pain, you may take acetaminophen which is found in Tylenol,m Datril, Anacin-3 and Panadol. It is not blood thinner. The products listed below are.  Do not take any of the products listed below in addition to any listed on your instruction sheet.  A.P.C or A.P.C with Codeine Codeine Phosphate Capsules #3 Ibuprofen Ridaura  ABC compound Congesprin Imuran rimadil  Advil Cope Indocin Robaxisal  Alka-Seltzer Effervescent Pain Reliever and Antacid Coricidin or Coricidin-D  Indomethacin Rufen  Alka-Seltzer plus Cold Medicine Cosprin Ketoprofen S-A-C Tablets  Anacin Analgesic Tablets or Capsules Coumadin Korlgesic Salflex  Anacin Extra Strength Analgesic tablets or capsules CP-2 Tablets Lanoril Salicylate  Anaprox Cuprimine Capsules Levenox Salocol  Anexsia-D Dalteparin Magan Salsalate  Anodynos Darvon compound Magnesium Salicylate Sine-off  Ansaid Dasin Capsules Magsal Sodium Salicylate  Anturane Depen Capsules Marnal Soma  APF Arthritis pain formula Dewitt's Pills Measurin Stanback  Argesic Dia-Gesic Meclofenamic Sulfinpyrazone  Arthritis Bayer Timed Release Aspirin Diclofenac Meclomen Sulindac  Arthritis pain formula Anacin Dicumarol Medipren Supac  Analgesic (Safety coated) Arthralgen Diffunasal Mefanamic Suprofen  Arthritis Strength Bufferin Dihydrocodeine Mepro  Compound Suprol  Arthropan liquid Dopirydamole Methcarbomol with Aspirin Synalgos  ASA tablets/Enseals Disalcid Micrainin Tagament  Ascriptin Doan's Midol Talwin  Ascriptin A/D Dolene Mobidin Tanderil  Ascriptin Extra Strength Dolobid Moblgesic Ticlid  Ascriptin with Codeine Doloprin or Doloprin with Codeine Momentum Tolectin  Asperbuf Duoprin Mono-gesic Trendar  Aspergum Duradyne Motrin or Motrin IB Triminicin  Aspirin plain, buffered or enteric coated Durasal Myochrisine Trigesic  Aspirin Suppositories Easprin Nalfon Trillsate  Aspirin with Codeine Ecotrin Regular or Extra Strength Naprosyn Uracel  Atromid-S Efficin Naproxen Ursinus  Auranofin Capsules Elmiron Neocylate Vanquish  Axotal Emagrin Norgesic Verin  Azathioprine Empirin or Empirin with Codeine Normiflo Vitamin E  Azolid Emprazil Nuprin Voltaren  Bayer Aspirin plain, buffered or children's or timed BC Tablets or powders Encaprin Orgaran Warfarin Sodium  Buff-a-Comp Enoxaparin Orudis Zorpin  Buff-a-Comp with Codeine Equegesic Os-Cal-Gesic   Buffaprin Excedrin plain, buffered or Extra Strength Oxalid   Bufferin Arthritis Strength Feldene Oxphenbutazone   Bufferin plain or Extra Strength Feldene Capsules Oxycodone with Aspirin   Bufferin with Codeine Fenoprofen Fenoprofen Pabalate or Pabalate-SF   Buffets II Flogesic Panagesic   Buffinol plain or Extra Strength Florinal or Florinal with Codeine Panwarfarin   Buf-Tabs Flurbiprofen Penicillamine   Butalbital Compound Four-way cold tablets Penicillin   Butazolidin Fragmin Pepto-Bismol   Carbenicillin Geminisyn Percodan   Carna Arthritis Reliever Geopen Persantine   Carprofen Gold's salt Persistin   Chloramphenicol Goody's Phenylbutazone   Chloromycetin Haltrain Piroxlcam   Clmetidine heparin Plaquenil   Cllnoril Hyco-pap Ponstel   Clofibrate Hydroxy chloroquine Propoxyphen         Before stopping any of these medications, be sure to consult the physician who ordered them.   Some, such as Coumadin (Warfarin) are ordered to prevent or treat serious conditions such as "deep thrombosis", "pumonary embolisms", and other heart problems.  The amount of time that you may need off of the medication may also vary with the medication and the reason for which you were taking it.  If you are taking any of these medications, please make sure you notify your pain physician before you undergo any procedures.         Radiofrequency Ablation Radiofrequency ablation is a procedure that is performed to relieve pain. The  procedure is often used for back, neck, or arm pain. Radiofrequency ablation involves the use of a machine that creates radio waves to make heat. During the procedure, the heat is applied to the nerve that carries the pain signal. The heat damages the nerve and interferes with the pain signal. Pain relief usually starts about 2 weeks after the procedure and lasts for 6 months to 1 year. Tell a health care provider about: Any allergies you have. All medicines you are taking, including vitamins, herbs, eye drops, creams, and over-the-counter medicines. Any problems you or family members have had with anesthetic medicines. Any bleeding problems you have. Any surgeries you have had. Any medical conditions you have. Whether you are pregnant or may be pregnant. What are the risks? Generally, this is a safe procedure. However, problems may occur, including: Pain or soreness at the injection site. Allergic reaction to medicines given during the procedure. Bleeding. Infection at the injection site. Damage to nerves or blood vessels. What happens before the procedure? When to stop eating and drinking Follow instructions from your health care provider about what you may eat and drink before your procedure. These may include: 8 hours before the procedure Stop eating most foods. Do not eat meat, fried foods, or fatty foods. Eat only light foods, such as toast or  crackers. All liquids are okay except energy drinks and alcohol. 6 hours before the procedure Stop eating. Drink only clear liquids, such as water, clear fruit juice, black coffee, plain tea, and sports drinks. Do not drink energy drinks or alcohol. 2 hours before the procedure Stop drinking all liquids. You may be allowed to take medicine with small sips of water. If you do not follow your health care provider's instructions, your procedure may be delayed or canceled. Medicines Ask your health care provider about: Changing or stopping your regular medicines. This is especially important if you are taking diabetes medicines or blood thinners. Taking medicines such as aspirin and ibuprofen. These medicines can thin your blood. Do not take these medicines unless your health care provider tells you to take them. Taking over-the-counter medicines, vitamins, herbs, and supplements. General instructions Ask your health care provider what steps will be taken to help prevent infection. These steps may include: Removing hair at the procedure site. Washing skin with a germ-killing soap. Taking antibiotic medicine. If you will be going home right after the procedure, plan to have a responsible adult: Take you home from the hospital or clinic. You will not be allowed to drive. Care for you for the time you are told. What happens during the procedure?  You will be awake during the procedure. You will need to be able to talk with the health care provider during the procedure. An IV will be inserted into one of your veins. You will be given one or more of the following: A medicine to help you relax (sedative). A medicine to numb the area (local anesthetic). Your health care provider will insert a radiofrequency needle into the area to be treated. This is done with the help of fluoroscopy. A wire that carries the radio waves (electrode) will be put through the radiofrequency needle. An electrical  pulse will be sent through the electrode to verify the correct nerve that is causing your pain. You will feel a tingling sensation, and you may have muscle twitching. The tissue around the needle tip will be heated by an electric current that comes from the radiofrequency machine. This will numb the nerves.  The needle will be removed. A bandage (dressing) will be put on the insertion area. The procedure may vary among health care providers and hospitals. What happens after the procedure? Your blood pressure, heart rate, breathing rate, and blood oxygen level will be monitored until you leave the hospital or clinic. Return to your normal activities as told by your health care provider. Ask your health care provider what activities are safe for you. If you were given a sedative during the procedure, it can affect you for several hours. Do not drive or operate machinery until your health care provider says that it is safe. Summary Radiofrequency ablation is a procedure that is performed to relieve pain. The procedure is often used for back, neck, or arm pain. Radiofrequency ablation involves the use of a machine that creates radio waves to make heat. Plan to have a responsible adult take you home from the hospital or clinic. Do not drive or operate machinery until your health care provider says that it is safe. Return to your normal activities as told by your health care provider. Ask your health care provider what activities are safe for you. This information is not intended to replace advice given to you by your health care provider. Make sure you discuss any questions you have with your health care provider. Document Revised: 10/02/2020 Document Reviewed: 10/02/2020 Elsevier Patient Education  2024 [employer] Epidural Steroid Injection Patient Information  Description: The epidural space surrounds the nerves as they exit the spinal cord.  In some patients, the nerves can be compressed and  inflamed by a bulging disc or a tight spinal canal (spinal stenosis).  By injecting steroids into the epidural space, we can bring irritated nerves into direct contact with a potentially helpful medication.  These steroids act directly on the irritated nerves and can reduce swelling and inflammation which often leads to decreased pain.  Epidural steroids may be injected anywhere along the spine and from the neck to the low back depending upon the location of your pain.   After numbing the skin with local anesthetic (like Novocaine), a small needle is passed into the epidural space slowly.  You may experience a sensation of pressure while this is being done.  The entire block usually last less than 10 minutes.  Conditions which may be treated by epidural steroids:  Low back and leg pain Neck and arm pain Spinal stenosis Post-laminectomy syndrome Herpes zoster (shingles) pain Pain from compression fractures  Preparation for the injection:  Do not eat any solid food or dairy products within 8 hours of your appointment.  You may drink clear liquids up to 3 hours before appointment.  Clear liquids include water, black coffee, juice or soda.  No milk or cream please. You may take your regular medication, including pain medications, with a sip of water before your appointment  Diabetics should hold regular insulin (if taken separately) and take 1/2 normal NPH dos the morning of the procedure.  Carry some sugar containing items with you to your appointment. A driver must accompany you and be prepared to drive you home after your procedure.  Bring all your current medications with your. An IV may be inserted and sedation may be given at the discretion of the physician.   A blood pressure cuff, EKG and other monitors will often be applied during the procedure.  Some patients may need to have extra oxygen administered for a short period. You will be asked to provide medical information, including your  allergies, prior to the procedure.  We must know immediately if you are taking blood thinners (like Coumadin/Warfarin)  Or if you are allergic to IV iodine contrast (dye). We must know if you could possible be pregnant.  Possible side-effects: Bleeding from needle site Infection (rare, may require surgery) Nerve injury (rare) Numbness & tingling (temporary) Difficulty urinating (rare, temporary) Spinal headache ( a headache worse with upright posture) Light -headedness (temporary) Pain at injection site (several days) Decreased blood pressure (temporary) Weakness in arm/leg (temporary) Pressure sensation in back/neck (temporary)  Call if you experience: Fever/chills associated with headache or increased back/neck pain. Headache worsened by an upright position. New onset weakness or numbness of an extremity below the injection site Hives or difficulty breathing (go to the emergency room) Inflammation or drainage at the infection site Severe back/neck pain Any new symptoms which are concerning to you  Please note:  Although the local anesthetic injected can often make your back or neck feel good for several hours after the injection, the pain will likely return.  It takes 3-7 days for steroids to work in the epidural space.  You may not notice any pain relief for at least that one week.  If effective, we will often do a series of three injections spaced 3-6 weeks apart to maximally decrease your pain.  After the initial series, we generally will wait several months before considering a repeat injection of the same type.  If you have any questions, please call [phone] [facility]

## 2022-10-09 ENCOUNTER — Ambulatory Visit
Payer: No Typology Code available for payment source | Admitting: Student in an Organized Health Care Education/Training Program

## 2022-10-27 ENCOUNTER — Ambulatory Visit
Payer: No Typology Code available for payment source | Admitting: Student in an Organized Health Care Education/Training Program

## 2022-10-27 ENCOUNTER — Ambulatory Visit
Admission: RE | Admit: 2022-10-27 | Discharge: 2022-10-27 | Disposition: A | Discharge status: Home / Self Care | Payer: No Typology Code available for payment source | Source: Ambulatory Visit | Attending: Student in an Organized Health Care Education/Training Program | Admitting: Student in an Organized Health Care Education/Training Program

## 2022-10-27 ENCOUNTER — Encounter: Payer: Self-pay | Admitting: Student in an Organized Health Care Education/Training Program

## 2022-10-27 ENCOUNTER — Ambulatory Visit
Payer: No Typology Code available for payment source | Attending: Student in an Organized Health Care Education/Training Program | Admitting: Student in an Organized Health Care Education/Training Program

## 2022-10-27 VITALS — BP 121/67 | HR 57 | Temp 97.4°F | Resp 16 | Ht 68.0 in | Wt 236.0 lb

## 2022-10-27 DIAGNOSIS — M47814 Spondylosis without myelopathy or radiculopathy, thoracic region: Secondary | ICD-10-CM | POA: Insufficient documentation

## 2022-10-27 DIAGNOSIS — M5416 Radiculopathy, lumbar region: Secondary | ICD-10-CM | POA: Insufficient documentation

## 2022-10-27 DIAGNOSIS — M47894 Other spondylosis, thoracic region: Secondary | ICD-10-CM | POA: Diagnosis present

## 2022-10-27 MED ORDER — MIDAZOLAM HCL 2 MG/2ML IJ SOLN
0.5000 mg | Freq: Once | INTRAMUSCULAR | Status: AC
  Administered 2022-10-27: 2 mg via INTRAVENOUS
  Filled 2022-10-27: qty 2

## 2022-10-27 MED ORDER — DEXAMETHASONE SODIUM PHOSPHATE 10 MG/ML IJ SOLN
10.0000 mg | Freq: Once | INTRAMUSCULAR | Status: AC
  Administered 2022-10-27: 10 mg
  Filled 2022-10-27: qty 1

## 2022-10-27 MED ORDER — ROPIVACAINE HCL 2 MG/ML IJ SOLN
9.0000 mL | Freq: Once | INTRAMUSCULAR | Status: AC
  Administered 2022-10-27: 9 mL via PERINEURAL
  Filled 2022-10-27: qty 20

## 2022-10-27 MED ORDER — IOHEXOL 180 MG/ML  SOLN
10.0000 mL | Freq: Once | INTRAMUSCULAR | Status: DC
  Filled 2022-10-27: qty 20

## 2022-10-27 MED ORDER — LIDOCAINE HCL 2 % IJ SOLN
20.0000 mL | Freq: Once | INTRAMUSCULAR | Status: AC
  Administered 2022-10-27: 400 mg
  Filled 2022-10-27: qty 20

## 2022-10-27 MED ORDER — SODIUM CHLORIDE (PF) 0.9 % IJ SOLN
INTRAMUSCULAR | Status: AC
  Filled 2022-10-27: qty 10

## 2022-10-27 MED ORDER — LACTATED RINGERS IV SOLN: Freq: Once | INTRAVENOUS | Status: AC

## 2022-10-27 NOTE — Progress Notes (Signed)
PROVIDER NOTE: Information contained herein reflects review and annotations entered in association with encounter. Interpretation of such information and data should be left to medically-trained personnel. Information provided to patient can be located elsewhere in the medical record under "Patient Instructions". Document created using STT-dictation technology, any transcriptional errors that may result from process are unintentional.    Patient: Evan Cherry  Service Category: Procedure  Provider: Edward Jolly, MD  DOB: [birth year]  DOS: 10/27/2022  Location: [facility]  MRN: [id number]  Setting: Ambulatory - outpatient  Referring Provider: Dellia Cloud, MD  Type: Established Patient  Specialty: Interventional Pain Management  PCP: Dellia Cloud, MD   Primary Reason for Visit: Interventional Pain Management Treatment. CC: Back Pain (Left, lower)  Procedure:          Anesthesia, Analgesia, Anxiolysis:  Type: Therapeutic Thoracic Medial Nerve Radiofrequency Ablation  #2  Region: Posterior   Level: T10-11, T11-12, T12-L1 and   Medial Branch Level(s) Laterality: Left-Sided Paraspinal  Type: Local Anesthesia with 2 mg IV Versed (minimal sedation) Indication(s): Analgesia and Anxiety Route: Intravenous (IV) IV Access: Secured Sedation: Meaningful verbal contact was maintained at all times during the procedure  Local Anesthetic: Lidocaine 1-2%  Position: Prone   Indications: 1. Thoracic facet joint syndrome   2. Thoracic spondylosis without myelopathy   3. Lumbar radicular pain (left L3/4)    Mr. Kinslow has been dealing with the above chronic pain for longer than three months and has either failed to respond, was unable to tolerate, or simply did not get enough benefit from other more conservative therapies including, but not limited to: 1. Over-the-counter medications 2. Anti-inflammatory medications 3. Muscle relaxants 4. Membrane stabilizers 5. Opioids 6.  Physical therapy and/or chiropractic manipulation 7. Modalities (Heat, ice, etc.) 8. Invasive techniques such as nerve blocks. Mr. Piotrowicz has attained more than 50% relief of the pain from a series of diagnostic injections conducted in separate occasions.  Pain Score: Pre-procedure: 5 /10 Post-procedure: 4 /10  Pre-op Assessment:  Mr. Caulley is a 56 y.o. (year old), male patient, seen today for interventional treatment. He  has no past surgical history on file. Mr. Widrig has a current medication list which includes the following prescription(s): acetaminophen, albuterol, albuterol, azelastine, benazepril, benazepril-hydrochlorthiazide, fasenra, budesonide-formoterol, carboxymethylcellulose sodium, cholecalciferol, ciclesonide, cyanocobalamin, cyanocobalamin, cyclobenzaprine, diclofenac sodium, doxycycline, dupilumab, epinephrine, guaifenesin, hydrochlorothiazide, hydrocodone-acetaminophen, hydrocodone-acetaminophen, lidocaine, loratadine, meloxicam, morphine, multiple vitamin, naproxen, naproxen sodium, pravastatin, prednisone, testosterone cypionate, tiotropium, tiotropium bromide monohydrate, tizanidine, tramadol, and gabapentin, and the following Facility-Administered Medications: iohexol and lactated ringers. His primarily concern today is the Back Pain (Left, lower)  Initial Vital Signs:  Pulse/HCG Rate: (!) 57ECG Heart Rate: (!) 53 Temp: (!) 97.4 F (36.3 C) Resp: 16 BP: 117/66 SpO2: 100 %  BMI: Estimated body mass index is 35.88 kg/m as calculated from the following:   Height as of this encounter: 5\' 8"  (1.727 m).   Weight as of this encounter: 236 lb (107 kg).  Risk Assessment: Allergies: Reviewed. He has No Known Allergies.  Allergy Precautions: None required Coagulopathies: Reviewed. None identified.  Blood-thinner therapy: None at this time Active Infection(s): Reviewed. None identified. Mr. Bredahl is afebrile  Site Confirmation: Mr. Cerney was asked to confirm the procedure and  laterality before marking the site Procedure checklist: Completed Consent: Before the procedure and under the influence of no sedative(s), amnesic(s), or anxiolytics, the patient was informed of the treatment options, risks and possible complications. To fulfill our ethical and legal obligations, as recommended  by the American Medical Association's Code of Ethics, I have informed the patient of my clinical impression; the nature and purpose of the treatment or procedure; the risks, benefits, and possible complications of the intervention; the alternatives, including doing nothing; the risk(s) and benefit(s) of the alternative treatment(s) or procedure(s); and the risk(s) and benefit(s) of doing nothing. The patient was provided information about the general risks and possible complications associated with the procedure. These may include, but are not limited to: failure to achieve desired goals, infection, bleeding, organ or nerve damage, allergic reactions, paralysis, and death. In addition, the patient was informed of those risks and complications associated to Spine-related procedures, such as failure to decrease pain; infection (i.e.: Meningitis, epidural or intraspinal abscess); bleeding (i.e.: epidural hematoma, subarachnoid hemorrhage, or any other type of intraspinal or peri-dural bleeding); organ or nerve damage (i.e.: Any type of peripheral nerve, nerve root, or spinal cord injury) with subsequent damage to sensory, motor, and/or autonomic systems, resulting in permanent pain, numbness, and/or weakness of one or several areas of the body; allergic reactions; (i.e.: anaphylactic reaction); and/or death. Furthermore, the patient was informed of those risks and complications associated with the medications. These include, but are not limited to: allergic reactions (i.e.: anaphylactic or anaphylactoid reaction(s)); adrenal axis suppression; blood sugar elevation that in diabetics may result in  ketoacidosis or comma; water retention that in patients with history of congestive heart failure may result in shortness of breath, pulmonary edema, and decompensation with resultant heart failure; weight gain; swelling or edema; medication-induced neural toxicity; particulate matter embolism and blood vessel occlusion with resultant organ, and/or nervous system infarction; and/or aseptic necrosis of one or more joints. Finally, the patient was informed that Medicine is not an exact science; therefore, there is also the possibility of unforeseen or unpredictable risks and/or possible complications that may result in a catastrophic outcome. The patient indicated having understood very clearly. We have given the patient no guarantees and we have made no promises. Enough time was given to the patient to ask questions, all of which were answered to the patient's satisfaction. Mr. Sollazzo has indicated that he wanted to continue with the procedure. Attestation: I, the ordering provider, attest that I have discussed with the patient the benefits, risks, side-effects, alternatives, likelihood of achieving goals, and potential problems during recovery for the procedure that I have provided informed consent. Date  Time: 10/27/2022  8:49 AM  Pre-Procedure Preparation:  Monitoring: As per clinic protocol. Respiration, ETCO2, SpO2, BP, heart rate and rhythm monitor placed and checked for adequate function Safety Precautions: Patient was assessed for positional comfort and pressure points before starting the procedure. Time-out: I initiated and conducted the "Time-out" before starting the procedure, as per protocol. The patient was asked to participate by confirming the accuracy of the "Time Out" information. Verification of the correct person, site, and procedure were performed and confirmed by me, the nursing staff, and the patient. "Time-out" conducted as per Joint Commission's Universal Protocol (UP.01.01.01). Time:  1006 (1022 time out for thoracic epidural steroid injection)  Description of Procedure:          Target Area: For thoracicFacet block(s), the target is the groove formed by the junction of the transverse process and superior articular process.  Approach: Paraspinal approach. Area Prepped: Entire Upper Back Area DuraPrep (Iodine Povacrylex [0.7% available iodine] and Isopropyl Alcohol, 74% w/w) Safety Precautions: Aspiration looking for blood return was conducted prior to all injections. At no point did we inject any substances, as  a needle was being advanced. No attempts were made at seeking any paresthesias. Safe injection practices and needle disposal techniques used. Medications properly checked for expiration dates. SDV (single dose vial) medications used. Description of the Procedure: Protocol guidelines were followed. The patient was placed in position over the procedure table. The target area was identified and the area prepped in the usual manner. The skin and muscle were infiltrated with local anesthetic. Appropriate amount of time allowed to pass for local anesthetics to take effect. Radiofrequency needles were introduced to the target area using fluoroscopic guidance. Using the NeuroTherm NT1100 Radiofrequency Generator, sensory stimulation using 50 Hz was used to locate & identify the nerve, making sure that the needle was positioned such that there was no sensory stimulation below 0.3 V or above 0.7 V. Stimulation using 2 Hz was used to evaluate the motor component. Care was taken not to lesion any nerves that demonstrated motor stimulation of the lower extremities at an output of less than 2.5 times that of the sensory threshold, or a maximum of 2.0 V. Once satisfactory placement of the needles was achieved, the numbing solution was slowly injected after negative aspiration. After waiting for at least 2 minutes, the ablation was performed at 80 degrees C for 60 seconds, using regular  Radiofrequency settings. Once the procedure was completed, the needles were then removed and the area cleansed, making sure to leave some of the prepping solution back to take advantage of its long term bactericidal properties. Intra-operative Compliance: Compliant        Vitals:   10/27/22 1015 10/27/22 1021 10/27/22 1025 10/27/22 1034  BP: (!) 134/103 (!) 149/99 (!) 144/102 121/67  Pulse:      Resp: 16 17 17 16   Temp:      TempSrc:      SpO2: 100% 100% 100% 97%  Weight:      Height:        Start Time: 1006 hrs. End Time: 1024 (stop time for RFA 1023) hrs. Materials & Medications:  Needle(s) Type: Teflon-coated, curved tip, Radiofrequency needle(s) Gauge: 22G Length: 10cm Medication(s): Please see orders for medications and dosing details. 6 cc solution made of 5 cc of 0.2% ropivacaine, 1 cc of Decadron 10 mg/cc.  2 cc injected at each level on the left after sensorimotor testing, prior to lesioning. Imaging Guidance (Spinal):          Type of Imaging Technique: Fluoroscopy Guidance (Spinal) Indication(s): Assistance in needle guidance and placement for procedures requiring needle placement in or near specific anatomical locations not easily accessible without such assistance. Exposure Time: Please see nurses notes. Contrast: None used. Fluoroscopic Guidance: I was personally present during the use of fluoroscopy. "Tunnel Vision Technique" used to obtain the best possible view of the target area. Parallax error corrected before commencing the procedure. "Direction-depth-direction" technique used to introduce the needle under continuous pulsed fluoroscopy. Once target was reached, antero-posterior, oblique, and lateral fluoroscopic projection used confirm needle placement in all planes. Images permanently stored in EMR. Interpretation: No contrast injected. I personally interpreted the imaging intraoperatively. Adequate needle placement confirmed in multiple planes. Permanent images  saved into the patient's record.  Antibiotic Prophylaxis:   Anti-infectives (From admission, onward)    None      Indication(s): None identified  Post-operative Assessment:  Post-procedure Vital Signs:  Pulse/HCG Rate: (!) 57(!) 59 Temp: (!) 97.4 F (36.3 C) Resp: 16 BP: 121/67 SpO2: 97 %  EBL: None  Complications: No immediate post-treatment complications observed by  team, or reported by patient.  Note: The patient tolerated the entire procedure well. A repeat set of vitals were taken after the procedure and the patient was kept under observation following institutional policy, for this type of procedure. Post-procedural neurological assessment was performed, showing return to baseline, prior to discharge. The patient was provided with post-procedure discharge instructions, including a section on how to identify potential problems. Should any problems arise concerning this procedure, the patient was given instructions to immediately contact us, at any time, without hesitation. In any case, we plan to contact the patient by telephone for a follow-up status report regarding this interventional procedure.  Comments:  No additional relevant information.  Plan of Care  Orders:  Orders Placed This Encounter  Procedures   DG PAIN CLIN[facility] C-ARM 1-60 MIN NO REPORT    Intraoperative interpretation by procedural physician at [facility].    Standing Status:   Standing    Number of Occurrences:   1    Order Specific Question:   Reason for exam:    Answer:   Assistance in needle guidance and placement for procedures requiring needle placement in or near specific anatomical locations not easily accessible without such assistance.   Medications ordered for procedure: Meds ordered this encounter  Medications   iohexol (OMNIPAQUE) 180 MG/ML injection 10 mL    Must be Myelogram-compatible. If not available, you may substitute with a water-soluble, non-ionic, hypoallergenic,  myelogram-compatible radiological contrast medium.   lidocaine (XYLOCAINE) 2 % (with pres) injection 400 mg   lactated ringers infusion   midazolam (VERSED) injection 0.5-2 mg    Make sure Flumazenil is available in the pyxis when using this medication. If oversedation occurs, administer 0.2 mg IV over 15 sec. If after 45 sec no response, administer 0.2 mg again over 1 min; may repeat at 1 min intervals; not to exceed 4 doses (1 mg)   dexamethasone (DECADRON) injection 10 mg   dexamethasone (DECADRON) injection 10 mg   ropivacaine (PF) 2 mg/mL (0.2%) (NAROPIN) injection 9 mL   ropivacaine (PF) 2 mg/mL (0.2%) (NAROPIN) injection 9 mL   Medications administered: We administered lidocaine, lactated ringers, midazolam, dexamethasone, dexamethasone, ropivacaine (PF) 2 mg/mL (0.2%), and ropivacaine (PF) 2 mg/mL (0.2%).  See the medical record for exact dosing, route, and time of administration.  Follow-up plan:   Return in about 6 weeks (around 12/08/2022), or PPE F2F.     Recent Visits Date Type Provider Dept  10/08/22 Office Visit Edward Jolly, MD [facility]  09/10/22 Procedure visit Edward Jolly, MD [facility]  08/13/22 Office Visit Edward Jolly, MD [facility]  Showing recent visits within past 90 days and meeting all other requirements Today's Visits Date Type Provider Dept  10/27/22 Procedure visit Edward Jolly, MD [facility]  Showing today's visits and meeting all other requirements Future Appointments Date Type Provider Dept  12/08/22 Appointment Edward Jolly, MD [facility]  Showing future appointments within next 90 days and meeting all other requirements  Disposition: Discharge home  Discharge (Date  Time): 10/27/2022; 1040 hrs.   Primary Care Physician: Dellia Cloud, MD Location: [facility] Note by: Edward Jolly, MD Date: 10/27/2022; Time: 10:40 AM  Disclaimer:  Medicine is not an  exact science. The only guarantee in medicine is that nothing is guaranteed. It is important to note that the decision to proceed with this intervention was based on the information collected from the patient. The Data and  conclusions were drawn from the patient's questionnaire, the interview, and the physical examination. Because the information was provided in large part by the patient, it cannot be guaranteed that it has not been purposely or unconsciously manipulated. Every effort has been made to obtain as much relevant data as possible for this evaluation. It is important to note that the conclusions that lead to this procedure are derived in large part from the available data. Always take into account that the treatment will also be dependent on availability of resources and existing treatment guidelines, considered by other Pain Management Practitioners as being common knowledge and practice, at the time of the intervention. For Medico-Legal purposes, it is also important to point out that variation in procedural techniques and pharmacological choices are the acceptable norm. The indications, contraindications, technique, and results of the above procedure should only be interpreted and judged by a Board-Certified Interventional Pain Specialist with extensive familiarity and expertise in the same exact procedure and technique.

## 2022-10-27 NOTE — Progress Notes (Signed)
Safety precautions to be maintained throughout the outpatient stay will include: orient to surroundings, keep bed in low position, maintain call bell within reach at all times, provide assistance with transfer out of bed and ambulation.  

## 2022-10-27 NOTE — Progress Notes (Signed)
PROVIDER NOTE: Interpretation of information contained herein should be left to medically-trained personnel. Specific patient instructions are provided elsewhere under "Patient Instructions" section of medical record. This document was created in part using STT-dictation technology, any transcriptional errors that may result from this process are unintentional.  Patient: Evan Cherry Type: Established DOB: 14-Feb-1966 MRN: [id number] PCP: Dellia Cloud, MD  Service: Procedure DOS: 10/27/2022 Setting: Ambulatory Location: Ambulatory outpatient facility Delivery: Face-to-face Provider: Edward Jolly, MD Specialty: Interventional Pain Management Specialty designation: 09 Location: Outpatient facility Ref. Prov.: Dellia Cloud, MD       Interventional Therapy   Procedure: Lumbar epidural steroid injection (LESI) (interlaminar)  Laterality: Left   Level:  T12-L1 Level.  Imaging: Fluoroscopic guidance         Anesthesia: Local anesthesia (1-2% Lidocaine) Anxiolysis: IV Versed 2 mg x1 DOS: 10/27/2022  Performed by: Edward Jolly, MD  Purpose: Diagnostic/Therapeutic Indications: Lumbar radicular pain of intraspinal etiology of more than 4 weeks that has failed to respond to conservative therapy and is severe enough to impact quality of life or function.  NAS-11 Pain score:   Pre-procedure: 5 /10   Post-procedure: 4 /10      Position / Prep / Materials:  Position: Prone w/ head of the table raised (slight reverse trendelenburg) to facilitate breathing.  Prep solution: DuraPrep (Iodine Povacrylex [0.7% available iodine] and Isopropyl Alcohol, 74% w/w) Prep Area: Entire Posterior Lumbar Region from lower scapular tip down to mid buttocks area and from flank to flank. Materials:  Tray: Epidural tray Needle(s):  Type: Epidural needle (Tuohy) Gauge (G):  22 Length: Regular (3.5-in) Qty: 1   H&P (Pre-op Assessment):  Mr. Lorenz is a 57 y.o. (year old), male patient, seen today for  interventional treatment. He  has no past surgical history on file. Mr. Beaubrun has a current medication list which includes the following prescription(s): acetaminophen, albuterol, albuterol, azelastine, benazepril, benazepril-hydrochlorthiazide, fasenra, budesonide-formoterol, carboxymethylcellulose sodium, cholecalciferol, ciclesonide, cyanocobalamin, cyanocobalamin, cyclobenzaprine, diclofenac sodium, doxycycline, dupilumab, epinephrine, guaifenesin, hydrochlorothiazide, hydrocodone-acetaminophen, hydrocodone-acetaminophen, lidocaine, loratadine, meloxicam, morphine, multiple vitamin, naproxen, naproxen sodium, pravastatin, prednisone, testosterone cypionate, tiotropium, tiotropium bromide monohydrate, tizanidine, tramadol, and gabapentin, and the following Facility-Administered Medications: iohexol and lactated ringers. His primarily concern today is the Back Pain (Left, lower)  Initial Vital Signs:  Pulse/HCG Rate: (!) 57ECG Heart Rate: (!) 53 Temp: (!) 97.4 F (36.3 C) Resp: 16 BP: 117/66 SpO2: 100 %  BMI: Estimated body mass index is 35.88 kg/m as calculated from the following:   Height as of this encounter: 5\' 8"  (1.727 m).   Weight as of this encounter: 236 lb (107 kg).  Risk Assessment: Allergies: Reviewed. He has No Known Allergies.  Allergy Precautions: None required Coagulopathies: Reviewed. None identified.  Blood-thinner therapy: None at this time Active Infection(s): Reviewed. None identified. Mr. Terrasi is afebrile  Site Confirmation: Mr. Palazzolo was asked to confirm the procedure and laterality before marking the site Procedure checklist: Completed Consent: Before the procedure and under the influence of no sedative(s), amnesic(s), or anxiolytics, the patient was informed of the treatment options, risks and possible complications. To fulfill our ethical and legal obligations, as recommended by the American Medical Association's Code of Ethics, I have informed the patient of my  clinical impression; the nature and purpose of the treatment or procedure; the risks, benefits, and possible complications of the intervention; the alternatives, including doing nothing; the risk(s) and benefit(s) of the alternative treatment(s) or procedure(s); and the risk(s) and benefit(s) of doing nothing. The patient was  provided information about the general risks and possible complications associated with the procedure. These may include, but are not limited to: failure to achieve desired goals, infection, bleeding, organ or nerve damage, allergic reactions, paralysis, and death. In addition, the patient was informed of those risks and complications associated to Spine-related procedures, such as failure to decrease pain; infection (i.e.: Meningitis, epidural or intraspinal abscess); bleeding (i.e.: epidural hematoma, subarachnoid hemorrhage, or any other type of intraspinal or peri-dural bleeding); organ or nerve damage (i.e.: Any type of peripheral nerve, nerve root, or spinal cord injury) with subsequent damage to sensory, motor, and/or autonomic systems, resulting in permanent pain, numbness, and/or weakness of one or several areas of the body; allergic reactions; (i.e.: anaphylactic reaction); and/or death. Furthermore, the patient was informed of those risks and complications associated with the medications. These include, but are not limited to: allergic reactions (i.e.: anaphylactic or anaphylactoid reaction(s)); adrenal axis suppression; blood sugar elevation that in diabetics may result in ketoacidosis or comma; water retention that in patients with history of congestive heart failure may result in shortness of breath, pulmonary edema, and decompensation with resultant heart failure; weight gain; swelling or edema; medication-induced neural toxicity; particulate matter embolism and blood vessel occlusion with resultant organ, and/or nervous system infarction; and/or aseptic necrosis of one or  more joints. Finally, the patient was informed that Medicine is not an exact science; therefore, there is also the possibility of unforeseen or unpredictable risks and/or possible complications that may result in a catastrophic outcome. The patient indicated having understood very clearly. We have given the patient no guarantees and we have made no promises. Enough time was given to the patient to ask questions, all of which were answered to the patient's satisfaction. Mr. Carnevale has indicated that he wanted to continue with the procedure. Attestation: I, the ordering provider, attest that I have discussed with the patient the benefits, risks, side-effects, alternatives, likelihood of achieving goals, and potential problems during recovery for the procedure that I have provided informed consent. Date  Time: 10/27/2022  8:49 AM   Pre-Procedure Preparation:  Monitoring: As per clinic protocol. Respiration, ETCO2, SpO2, BP, heart rate and rhythm monitor placed and checked for adequate function Safety Precautions: Patient was assessed for positional comfort and pressure points before starting the procedure. Time-out: I initiated and conducted the "Time-out" before starting the procedure, as per protocol. The patient was asked to participate by confirming the accuracy of the "Time Out" information. Verification of the correct person, site, and procedure were performed and confirmed by me, the nursing staff, and the patient. "Time-out" conducted as per Joint Commission's Universal Protocol (UP.01.01.01). Time: 1006 (1022 time out for thoracic epidural steroid injection) Start Time: 1006 hrs.  Description/Narrative of Procedure:          Target: Epidural space via interlaminar opening, initially targeting the lower laminar border of the superior vertebral body. Region: Lumbar Approach: Percutaneous paravertebral  Rationale (medical necessity): procedure needed and proper for the diagnosis and/or treatment of  the patient's medical symptoms and needs. Procedural Technique Safety Precautions: Aspiration looking for blood return was conducted prior to all injections. At no point did we inject any substances, as a needle was being advanced. No attempts were made at seeking any paresthesias. Safe injection practices and needle disposal techniques used. Medications properly checked for expiration dates. SDV (single dose vial) medications used. Description of the Procedure: Protocol guidelines were followed. The procedure needle was introduced through the skin, ipsilateral to the reported pain, and  advanced to the target area. Bone was contacted and the needle walked caudad, until the lamina was cleared. The epidural space was identified using "loss-of-resistance technique" with 2-3 ml of PF-NaCl (0.9% NSS), in a 5cc LOR glass syringe.  6 cc solution made of 3 cc of preservative-free saline, 2 cc of 0.2% ropivacaine, 1 cc of Decadron 10 mg/cc.   Vitals:   10/27/22 1015 10/27/22 1021 10/27/22 1025 10/27/22 1034  BP: (!) 134/103 (!) 149/99 (!) 144/102 121/67  Pulse:      Resp: 16 17 17 16   Temp:      TempSrc:      SpO2: 100% 100% 100% 97%  Weight:      Height:        Start Time: 1006 hrs. End Time: 1024 (stop time for RFA 1023) hrs.  Imaging Guidance (Spinal):          Type of Imaging Technique: Fluoroscopy Guidance (Spinal) Indication(s): Assistance in needle guidance and placement for procedures requiring needle placement in or near specific anatomical locations not easily accessible without such assistance. Exposure Time: Please see nurses notes. Contrast: Before injecting any contrast, we confirmed that the patient did not have an allergy to iodine, shellfish, or radiological contrast. Once satisfactory needle placement was completed at the desired level, radiological contrast was injected. Contrast injected under live fluoroscopy. No contrast complications. See chart for type and volume of contrast  used. Fluoroscopic Guidance: I was personally present during the use of fluoroscopy. "Tunnel Vision Technique" used to obtain the best possible view of the target area. Parallax error corrected before commencing the procedure. "Direction-depth-direction" technique used to introduce the needle under continuous pulsed fluoroscopy. Once target was reached, antero-posterior, oblique, and lateral fluoroscopic projection used confirm needle placement in all planes. Images permanently stored in EMR. Interpretation: I personally interpreted the imaging intraoperatively. Adequate needle placement confirmed in multiple planes. Appropriate spread of contrast into desired area was observed. No evidence of afferent or efferent intravascular uptake. No intrathecal or subarachnoid spread observed. Permanent images saved into the patient's record.  Antibiotic Prophylaxis:   Anti-infectives (From admission, onward)    None      Indication(s): None identified  Post-operative Assessment:  Post-procedure Vital Signs:  Pulse/HCG Rate: (!) 57(!) 59 Temp: (!) 97.4 F (36.3 C) Resp: 16 BP: 121/67 SpO2: 97 %  EBL: None  Complications: No immediate post-treatment complications observed by team, or reported by patient.  Note: The patient tolerated the entire procedure well. A repeat set of vitals were taken after the procedure and the patient was kept under observation following institutional policy, for this type of procedure. Post-procedural neurological assessment was performed, showing return to baseline, prior to discharge. The patient was provided with post-procedure discharge instructions, including a section on how to identify potential problems. Should any problems arise concerning this procedure, the patient was given instructions to immediately contact us, at any time, without hesitation. In any case, we plan to contact the patient by telephone for a follow-up status report regarding this interventional  procedure.  Comments:  No additional relevant information.  Plan of Care (POC)  Orders:  Orders Placed This Encounter  Procedures   DG PAIN [facility] C-ARM 1-60 MIN NO REPORT    Intraoperative interpretation by procedural physician at [facility].    Standing Status:   Standing    Number of Occurrences:   1    Order Specific Question:   Reason for exam:    Answer:   Assistance in  needle guidance and placement for procedures requiring needle placement in or near specific anatomical locations not easily accessible without such assistance.     Medications ordered for procedure: Meds ordered this encounter  Medications   iohexol (OMNIPAQUE) 180 MG/ML injection 10 mL    Must be Myelogram-compatible. If not available, you may substitute with a water-soluble, non-ionic, hypoallergenic, myelogram-compatible radiological contrast medium.   lidocaine (XYLOCAINE) 2 % (with pres) injection 400 mg   lactated ringers infusion   midazolam (VERSED) injection 0.5-2 mg    Make sure Flumazenil is available in the pyxis when using this medication. If oversedation occurs, administer 0.2 mg IV over 15 sec. If after 45 sec no response, administer 0.2 mg again over 1 min; may repeat at 1 min intervals; not to exceed 4 doses (1 mg)   dexamethasone (DECADRON) injection 10 mg   dexamethasone (DECADRON) injection 10 mg   ropivacaine (PF) 2 mg/mL (0.2%) (NAROPIN) injection 9 mL   ropivacaine (PF) 2 mg/mL (0.2%) (NAROPIN) injection 9 mL   Medications administered: We administered lidocaine, lactated ringers, midazolam, dexamethasone, dexamethasone, ropivacaine (PF) 2 mg/mL (0.2%), and ropivacaine (PF) 2 mg/mL (0.2%).  See the medical record for exact dosing, route, and time of administration.  Follow-up plan:   Return in about 6 weeks (around 12/08/2022), or PPE F2F.       Recent Visits Date Type Provider Dept  10/08/22 Office Visit Edward Jolly, MD [facility]  09/10/22 Procedure  visit Edward Jolly, MD [facility]  08/13/22 Office Visit Edward Jolly, MD [facility]  Showing recent visits within past 90 days and meeting all other requirements Today's Visits Date Type Provider Dept  10/27/22 Procedure visit Edward Jolly, MD [facility]  Showing today's visits and meeting all other requirements Future Appointments Date Type Provider Dept  12/08/22 Appointment Edward Jolly, MD [facility]  Showing future appointments within next 90 days and meeting all other requirements  Disposition: Discharge home  Discharge (Date  Time): 10/27/2022; 1040 hrs.   Primary Care Physician: Dellia Cloud, MD Location: [facility] Note by: Edward Jolly, MD (TTS technology used. I apologize for any typographical errors that were not detected and corrected.) Date: 10/27/2022; Time: 10:42 AM  Disclaimer:  Medicine is not an Visual merchandiser. The only guarantee in medicine is that nothing is guaranteed. It is important to note that the decision to proceed with this intervention was based on the information collected from the patient. The Data and conclusions were drawn from the patient's questionnaire, the interview, and the physical examination. Because the information was provided in large part by the patient, it cannot be guaranteed that it has not been purposely or unconsciously manipulated. Every effort has been made to obtain as much relevant data as possible for this evaluation. It is important to note that the conclusions that lead to this procedure are derived in large part from the available data. Always take into account that the treatment will also be dependent on availability of resources and existing treatment guidelines, considered by other Pain Management Practitioners as being common knowledge and practice, at the time of the intervention. For Medico-Legal purposes, it is also important to point out that  variation in procedural techniques and pharmacological choices are the acceptable norm. The indications, contraindications, technique, and results of the above procedure should only be interpreted and judged by a Board-Certified Interventional Pain Specialist with extensive familiarity and expertise in the same exact procedure and technique.

## 2022-10-27 NOTE — Patient Instructions (Signed)

## 2022-10-28 ENCOUNTER — Telehealth: Payer: Self-pay | Admitting: *Deleted

## 2022-10-28 NOTE — Telephone Encounter (Signed)
No problems post procedure. 

## 2022-12-08 ENCOUNTER — Ambulatory Visit
Payer: No Typology Code available for payment source | Attending: Student in an Organized Health Care Education/Training Program | Admitting: Student in an Organized Health Care Education/Training Program

## 2022-12-08 ENCOUNTER — Encounter: Payer: Self-pay | Admitting: Student in an Organized Health Care Education/Training Program

## 2022-12-08 VITALS — BP 135/69 | HR 80 | Temp 98.4°F | Resp 16 | Ht 68.0 in | Wt 232.0 lb

## 2022-12-08 DIAGNOSIS — G894 Chronic pain syndrome: Secondary | ICD-10-CM | POA: Diagnosis present

## 2022-12-08 DIAGNOSIS — M5416 Radiculopathy, lumbar region: Secondary | ICD-10-CM

## 2022-12-08 DIAGNOSIS — G8929 Other chronic pain: Secondary | ICD-10-CM | POA: Insufficient documentation

## 2022-12-08 DIAGNOSIS — M25511 Pain in right shoulder: Secondary | ICD-10-CM

## 2022-12-08 NOTE — Progress Notes (Signed)
PROVIDER NOTE: Information contained herein reflects review and annotations entered in association with encounter. Interpretation of such information and data should be left to medically-trained personnel. Information provided to patient can be located elsewhere in the medical record under "Patient Instructions". Document created using STT-dictation technology, any transcriptional errors that may result from process are unintentional.    Patient: Evan Cherry  Service Category: E/M  Provider: Edward Jolly, MD  DOB: 1965/11/03  DOS: 12/08/2022  Referring Provider: Dellia Cloud, MD  MRN: [id number]  Specialty: Interventional Pain Management  PCP: Dellia Cloud, MD  Type: Established Patient  Setting: Ambulatory outpatient    Location: Office  Delivery: Face-to-face     HPI  Evan Cherry, a 57 y.o. year old male, is here today because of his Lumbar radicular pain [M54.16]. Mr. Amer primary complain today is Back Pain and Shoulder Pain (right)  Pertinent problems: Mr. Jobin has Bilateral post-traumatic osteoarthritis of knee; Chronic pain of both knees; Thoracic facet joint syndrome; Thoracic spondylosis without myelopathy; and Lumbar facet arthropathy on their pertinent problem list. Pain Assessment: Severity of Chronic pain is reported as a 4 /10. Location: Back Mid/down left leg to knee. Onset: More than a month ago. Quality: Constant, Sharp, Throbbing. Timing: Constant. Modifying factor(s): meds, ice. Vitals:  height is 5\' 8"  (1.727 m) and weight is 232 lb (105.2 kg). His temporal temperature is 98.4 F (36.9 C). His blood pressure is 135/69 and his pulse is 80. His respiration is 16 and oxygen saturation is 98%.  BMI: Estimated body mass index is 35.28 kg/m as calculated from the following:   Height as of this encounter: 5\' 8"  (1.727 m).   Weight as of this encounter: 232 lb (105.2 kg). Last encounter: 10/08/2022. Last procedure: 10/27/2022.  Reason for encounter: post-procedure  evaluation and assessment.    Post-procedure evaluation   Procedure: Lumbar epidural steroid injection (LESI) (interlaminar)  Laterality: Left   Level:  T12-L1 Level.  Imaging: Fluoroscopic guidance         Anesthesia: Local anesthesia (1-2% Lidocaine) Anxiolysis: IV Versed 2 mg x1 DOS: 10/27/2022  Performed by: Edward Jolly, MD  Purpose: Diagnostic/Therapeutic Indications: Lumbar radicular pain of intraspinal etiology of more than 4 weeks that has failed to respond to conservative therapy and is severe enough to impact quality of life or function.  NAS-11 Pain score:   Pre-procedure: 5 /10   Post-procedure: 4 /10      Effectiveness:  Initial hour after procedure: 100 % (lesi)  Subsequent 4-6 hours post-procedure: 50 %  Analgesia past initial 6 hours: 50 %  Ongoing improvement:  Analgesic:  50% Function: Somewhat improved ROM: Somewhat improved     ROS  Constitutional: Denies any fever or chills Gastrointestinal: No reported hemesis, hematochezia, vomiting, or acute GI distress Musculoskeletal:  Right shoulder pain Neurological: No reported episodes of acute onset apraxia, aphasia, dysarthria, agnosia, amnesia, paralysis, loss of coordination, or loss of consciousness  Medication Review  Carboxymethylcellulose Sodium, Cyanocobalamin, Dupilumab, EPINEPHrine, HYDROcodone-acetaminophen, Multiple Vitamin, Tiotropium Bromide Monohydrate, acetaminophen, albuterol, azelastine, benazepril, benazepril-hydrochlorthiazide, benralizumab, budesonide-formoterol, cholecalciferol, ciclesonide, cyanocobalamin, cyclobenzaprine, diclofenac Sodium, doxycycline, gabapentin, guaiFENesin, hydrochlorothiazide, lidocaine, loratadine, meloxicam, morphine, naproxen, naproxen sodium, pravastatin, predniSONE, testosterone cypionate, tiZANidine, tiotropium, and traMADol  History Review  Allergy: Mr. Yother has No Known Allergies. Drug: Mr. Rodeffer  reports no history of drug use. Alcohol:  reports no  history of alcohol use. Tobacco:  reports that he has quit smoking. His smokeless tobacco use includes snuff. Social: Mr. Puello  reports that he has quit smoking. His smokeless tobacco use includes snuff. He reports that he does not drink alcohol and does not use drugs. Medical:  has a past medical history of Asthma, Back pain, COPD (chronic obstructive pulmonary disease) (HCC), Decreased lung capacity, Hypertension, and Sleep apnea. Surgical: Mr. Losco  has a past surgical history that includes Colon surgery. Family: family history is not on file. He was adopted.  Laboratory Chemistry Profile   Renal Lab Results  Component Value Date   BUN 15 12/09/2014   CREATININE 0.82 12/09/2014   GFRAA >60 12/09/2014   GFRNONAA >60 12/09/2014    Hepatic Lab Results  Component Value Date   AST 25 12/08/2014   ALT 34 12/08/2014   ALBUMIN 4.4 12/08/2014   ALKPHOS 72 12/08/2014   LIPASE 171 12/26/2011    Electrolytes Lab Results  Component Value Date   NA 142 12/09/2014   K 4.2 12/09/2014   CL 107 12/09/2014   CALCIUM 9.2 12/09/2014    Bone No results found for: "VD25OH", "VD125OH2TOT", "WG9562ZH0", "QM5784ON6", "25OHVITD1", "25OHVITD2", "25OHVITD3", "TESTOFREE", "TESTOSTERONE"  Inflammation (CRP: Acute Phase) (ESR: Chronic Phase) No results found for: "CRP", "ESRSEDRATE", "LATICACIDVEN"       Note: Above Lab results reviewed.  Recent Imaging Review  DG PAIN CLINIC C-ARM 1-60 MIN NO REPORT Fluoro was used, but no Radiologist interpretation will be provided.  Please refer to "NOTES" tab for provider progress note. Note: Reviewed        Physical Exam  General appearance: Well nourished, well developed, and well hydrated. In no apparent acute distress Mental status: Alert, oriented x 3 (person, place, & time)       Respiratory: No evidence of acute respiratory distress Eyes: PERLA Vitals: BP 135/69   Pulse 80   Temp 98.4 F (36.9 C) (Temporal)   Resp 16   Ht 5\' 8"  (1.727 m)   Wt  232 lb (105.2 kg)   SpO2 98%   BMI 35.28 kg/m  BMI: Estimated body mass index is 35.28 kg/m as calculated from the following:   Height as of this encounter: 5\' 8"  (1.727 m).   Weight as of this encounter: 232 lb (105.2 kg). Ideal: Ideal body weight: 68.4 kg (150 lb 12.7 oz) Adjusted ideal body weight: 83.1 kg (183 lb 4.4 oz)  Assessment   Diagnosis Status  1. Lumbar radicular pain (left L3/4)   2. Chronic right shoulder pain   3. Chronic pain syndrome    Controlled Controlled Controlled   Updated Problems: No problems updated.  Plan of Care  Patient has an upcoming appointment with orthopedics at the [facility] regarding his right shoulder. Follow-up as needed for repeat T12-L1 ESI.   Follow-up plan:   Return for patient will call to schedule F2F appt prn.      B/L IA Knee steroid 12/12/19, 07/29/21: Helpful,  T10, 11, 12 MBB on 12/19/2019 followed by RFA (previosly done with Dr Ollen Bowl in 2018), status post bilateral T10, T11, T12 thoracic facet medial branch nerve block 8/23/ 2021 which provided 75% pain relief for 3-1/2 weeks with gradual return of pain thereafter.  Right T10, T11, T12 RFA on 01/30/2020; left T10, T11, T12 RFA on 02/20/2020, 12/05/20, 07/29/20, Left L4/5 ESI 05/05/22, right C7/T1 ESI and right SSNB 05/26/22     Recent Visits Date Type Provider Dept  10/27/22 Procedure visit Edward Jolly, MD [facility]  10/08/22 Office Visit Edward Jolly, MD [facility]  09/10/22 Procedure visit Edward Jolly, MD [facility]  Showing recent visits within past 90 days and meeting all other requirements Today's Visits Date Type Provider Dept  12/08/22 Office Visit Edward Jolly, MD [facility]  Showing today's visits and meeting all other requirements Future Appointments No visits were found meeting these conditions. Showing future appointments within next 90 days and meeting all other requirements  I discussed the assessment and treatment  plan with the patient. The patient was provided an opportunity to ask questions and all were answered. The patient agreed with the plan and demonstrated an understanding of the instructions.  Patient advised to call back or seek an in-person evaluation if the symptoms or condition worsens.  Duration of encounter: .  Total time on encounter, as per AMA guidelines included both the face-to-face and non-face-to-face time personally spent by the physician and/or other qualified health care professional(s) on the day of the encounter (includes time in activities that require the physician or other qualified health care professional and does not include time in activities normally performed by clinical staff). Physician's time may include the following activities when performed: Preparing to see the patient (e.g., pre-charting review of records, searching for previously ordered imaging, lab work, and nerve conduction tests) Review of prior analgesic pharmacotherapies. Reviewing PMP Interpreting ordered tests (e.g., lab work, imaging, nerve conduction tests) Performing post-procedure evaluations, including interpretation of diagnostic procedures Obtaining and/or reviewing separately obtained history Performing a medically appropriate examination and/or evaluation Counseling and educating the patient/family/caregiver Ordering medications, tests, or procedures Referring and communicating with other health care professionals (when not separately reported) Documenting clinical information in the electronic or other health record Independently interpreting results (not separately reported) and communicating results to the patient/ family/caregiver Care coordination (not separately reported)  Note by: Edward Jolly, MD Date: 12/08/2022; Time: 3:09 PM

## 2022-12-08 NOTE — Progress Notes (Signed)
Safety precautions to be maintained throughout the outpatient stay will include: orient to surroundings, keep bed in low position, maintain call bell within reach at all times, provide assistance with transfer out of bed and ambulation.  

## 2023-10-20 ENCOUNTER — Ambulatory Visit
Attending: Student in an Organized Health Care Education/Training Program | Admitting: Student in an Organized Health Care Education/Training Program

## 2023-10-20 ENCOUNTER — Encounter: Payer: Self-pay | Admitting: Student in an Organized Health Care Education/Training Program

## 2023-10-20 VITALS — BP 143/61 | HR 83 | Temp 99.3°F | Resp 16 | Ht 68.0 in | Wt 224.0 lb

## 2023-10-20 DIAGNOSIS — M5416 Radiculopathy, lumbar region: Secondary | ICD-10-CM | POA: Insufficient documentation

## 2023-10-20 DIAGNOSIS — M47894 Other spondylosis, thoracic region: Secondary | ICD-10-CM | POA: Diagnosis not present

## 2023-10-20 DIAGNOSIS — M47814 Spondylosis without myelopathy or radiculopathy, thoracic region: Secondary | ICD-10-CM

## 2023-10-20 DIAGNOSIS — M5126 Other intervertebral disc displacement, lumbar region: Secondary | ICD-10-CM

## 2023-10-20 DIAGNOSIS — G894 Chronic pain syndrome: Secondary | ICD-10-CM

## 2023-10-20 NOTE — Progress Notes (Signed)
 Safety precautions to be maintained throughout the outpatient stay will include: orient to surroundings, keep bed in low position, maintain call bell within reach at all times, provide assistance with transfer out of bed and ambulation.

## 2023-10-20 NOTE — Progress Notes (Signed)
 PROVIDER NOTE: Interpretation of information contained herein should be left to medically-trained personnel. Specific patient instructions are provided elsewhere under Patient Instructions section of medical record. This document was created in part using AI and STT-dictation technology, any transcriptional errors that may result from this process are unintentional.  Patient: Evan Cherry  Service: E/M   PCP: Iven Harlene CROME, MD  DOB: Apr 02, 1966  DOS: 10/20/2023  Provider: Wallie Sherry, MD  MRN: [id number]  Delivery: Face-to-face  Specialty: Interventional Pain Management  Type: Established Patient  Setting: Ambulatory outpatient facility  Specialty designation: 09  Referring Prov.: Iven Harlene CROME, MD  Location: Outpatient office facility       History of present illness (HPI) [name], a 58 y.o. year old male, is here today because of his Lumbar radicular pain [M54.16]. Mr. Brisby primary complain today is Back Pain (Mid to low back (mid back worse) )  Pertinent problems: Mr. Dimaano has Bilateral post-traumatic osteoarthritis of knee; Chronic pain of both knees; Thoracic facet joint syndrome; Thoracic spondylosis without myelopathy; and Lumbar facet arthropathy on their pertinent problem list.  Pain Assessment: Severity of Chronic pain is reported as a 6 /10. Location: Back Mid, Lower, Right, Left/Radaites from lower right back to right hip and louter right thigh to outer right knee. Onset:  . Quality: Burning, Aching, Sharp, Shooting, Constant (Electrical). Timing:  . Modifying factor(s): Procedures, pain medication helps some but not much, and ice. Vitals:  height is 5' 8 (1.727 m) and weight is 224 lb (101.6 kg). His temporal temperature is 99.3 F (37.4 C). His blood pressure is 143/61 (abnormal) and his pulse is 83. His respiration is 16 and oxygen saturation is 99%.  BMI: Estimated body mass index is 34.06 kg/m as calculated from the following:   Height as of this encounter:  5' 8 (1.727 m).   Weight as of this encounter: 224 lb (101.6 kg).  Last encounter: 12/08/2022. Last procedure: 10/27/2022.  Reason for encounter:  Discussed the use of AI scribe software for clinical note transcription with the patient, who gave verbal consent to proceed.  History of Present Illness   Evan Cherry is a 58 year old male who presents with mid thoracic pain radiating to the right leg.  He experiences significant mid thoracic pain that radiates down his right leg, described as a constant burning sensation along the outside of the leg. This pain is similar to previous episodes where he found relief from epidural steroid injections (ESI).  In the past, he has undergone left-sided L4, L5 epidural injections, which provided significant relief for his leg pain. These epidurals have been effective in alleviating his leg symptoms for extended periods.  He also underwent radiofrequency ablation (RFA) for his thoracic spine in April 2023, targeting of this thoracic spine, which provided moderate relief. He recalls that the ablations were performed with Versed , and he is comfortable with this approach for future procedures.       ROS  Constitutional: Denies any fever or chills Gastrointestinal: No reported hemesis, hematochezia, vomiting, or acute GI distress Musculoskeletal: Denies any acute onset joint swelling, redness, loss of ROM, or weakness Neurological: No reported episodes of acute onset apraxia, aphasia, dysarthria, agnosia, amnesia, paralysis, loss of coordination, or loss of consciousness  Medication Review  Carboxymethylcellulose Sodium, Cyanocobalamin, Dupilumab, EPINEPHrine, HYDROcodone -acetaminophen , Multiple Vitamin, Tiotropium Bromide Monohydrate, acetaminophen , albuterol, azelastine, benazepril , benazepril -hydrochlorthiazide, benralizumab, budesonide-formoterol, cholecalciferol, ciclesonide, cyanocobalamin, cyclobenzaprine, diclofenac Sodium, doxycycline, gabapentin ,  guaiFENesin, hydrochlorothiazide , lidocaine , loratadine, meloxicam, morphine, naproxen, naproxen  sodium, pravastatin, predniSONE, testosterone  cypionate, tiZANidine , tiotropium, and traMADol   History Review  Allergy: Evan Cherry has no known allergies. Drug: Evan Cherry  reports no history of drug use. Alcohol:  reports no history of alcohol use. Tobacco:  reports that he has quit smoking. His smokeless tobacco use includes snuff. Social: Mr. Sells  reports that he has quit smoking. His smokeless tobacco use includes snuff. He reports that he does not drink alcohol and does not use drugs. Medical:  has a past medical history of Asthma, Back pain, COPD (chronic obstructive pulmonary disease) (HCC), Decreased lung capacity, Hypertension, and Sleep apnea. Surgical: Evan Cherry  has a past surgical history that includes Colon surgery. Family: family history is not on file. He was adopted.  Laboratory Chemistry Profile   Renal Lab Results  Component Value Date   BUN 15 12/09/2014   CREATININE 0.82 12/09/2014   GFRAA >60 12/09/2014   GFRNONAA >60 12/09/2014    Hepatic Lab Results  Component Value Date   AST 25 12/08/2014   ALT 34 12/08/2014   ALBUMIN 4.4 12/08/2014   ALKPHOS 72 12/08/2014   LIPASE 171 12/26/2011    Electrolytes Lab Results  Component Value Date   NA 142 12/09/2014   K 4.2 12/09/2014   CL 107 12/09/2014   CALCIUM 9.2 12/09/2014    Bone No results found for: VD25OH, VD125OH2TOT, CI6874NY7, CI7874NY7, 25OHVITD1, 25OHVITD2, 25OHVITD3, TESTOFREE, TESTOSTERONE   Inflammation (CRP: Acute Phase) (ESR: Chronic Phase) No results found for: CRP, ESRSEDRATE, LATICACIDVEN       Note: Above Lab results reviewed.  Recent Imaging Review  DG PAIN CLINIC C-ARM 1-60 MIN NO REPORT Fluoro was used, but no Radiologist interpretation will be provided.  Please refer to NOTES tab for provider progress note. Note: Reviewed        Physical Exam  General appearance:  Well nourished, well developed, and well hydrated. In no apparent acute distress Mental status: Alert, oriented x 3 (person, place, & time)       Respiratory: No evidence of acute respiratory distress Eyes: PERLA Vitals: BP (!) 143/61 (Patient Position: Sitting, Cuff Size: Normal)   Pulse 83   Temp 99.3 F (37.4 C) (Temporal)   Resp 16   Ht 5' 8 (1.727 m)   Wt 224 lb (101.6 kg)   SpO2 99%   BMI 34.06 kg/m  BMI: Estimated body mass index is 34.06 kg/m as calculated from the following:   Height as of this encounter: 5' 8 (1.727 m).   Weight as of this encounter: 224 lb (101.6 kg). Ideal: Ideal body weight: 68.4 kg (150 lb 12.7 oz) Adjusted ideal body weight: 81.7 kg (180 lb 1.2 oz)  Midthoracic pain related to thoracic facet arthropathy  Low back and radiating right leg pain  Assessment   Diagnosis  1. Lumbar radicular pain (RIGHT L4/5)   2. Lumbar disc herniation   3. Thoracic facet joint syndrome   4. Thoracic spondylosis without myelopathy   5. Chronic pain syndrome      Updated Problems: No problems updated.  Plan of Care  1. Lumbar radicular pain (RIGHT L4/5) (Primary) - Lumbar Epidural Injection; Future  2. Lumbar disc herniation - Lumbar Epidural Injection; Future  3. Thoracic facet joint syndrome - Radiofrequency,Thoracic; Future  4. Thoracic spondylosis without myelopathy - Radiofrequency,Thoracic; Future  5. Chronic pain syndrome   Assessment and Plan    Sciatica   He experiences chronic right leg sciatica with severe, burning pain radiating down the right leg. Previous  left-sided L4-L5 epidural steroid injections (ESI) effectively managed similar symptoms. Plan to perform a right L4-L5 ESI on October 21, 2023, at 11:00 AM to alleviate leg symptoms before addressing mid thoracic back pain. Arrive by 10:45 AM for the procedure.  Mid thoracic back pain   He has chronic mid thoracic back pain with moderate relief from previous radiofrequency ablation  (RFA) at T10-L1  in April 2023. Plan to perform another RFA approximately two weeks after the ESI to allow leg pain to subside. Schedule the RFA pending insurance approval and use Versed  for sedation. Bring a driver on the day of the RFA.        Orders:  Orders Placed This Encounter  Procedures   Radiofrequency,Thoracic    Standing Status:   Future    Expiration Date:   01/20/2024    Scheduling Instructions:     Type: Therapeutic Thoracic Medial Nerve Radiofrequency Ablation  #3 (#2 done 12/19/20)      Region: Posterior       Level: T10-11, T11-12, and T12-L1 Medial Branch Level(s)     Laterality: Bilateral Paraspinal     IV Versed     Where will this procedure be performed?:   [facility]   Lumbar Epidural Injection    Standing Status:   Future    Expiration Date:   01/20/2024    Scheduling Instructions:     Procedure: Interlaminar Lumbar Epidural Steroid injection (LESI)            Laterality: RIGHT L4-5     Sedation: Patient's choice.     Timeframe: As soon as schedule allows.    Where will this procedure be performed?:   [facility]     B/L IA Knee steroid 12/12/19, 07/29/21: Helpful,  T10, 11, 12 MBB on 12/19/2019 followed by RFA (previosly done with Dr Mindi in 2018), status post bilateral T10, T11, T12 thoracic facet medial branch nerve block 8/23/ 2021 which provided 75% pain relief for 3-1/2 weeks with gradual return of pain thereafter.  Right T10, T11, T12 RFA on 01/30/2020; left T10, T11, T12 RFA on 02/20/2020, 12/05/20, 07/29/20, Left L4/5 ESI 05/05/22, right C7/T1 ESI and right SSNB 05/26/22     Return in about 1 day (around 10/21/2023) for Right L4-5 ESI , in clinic NS 0820 am, NS.    Recent Visits No visits were found meeting these conditions. Showing recent visits within past 90 days and meeting all other requirements Today's Visits Date Type Provider Dept  10/20/23 Office Visit Marcelino Nurse, MD [facility]  Showing today's visits and meeting  all other requirements Future Appointments Date Type Provider Dept  10/21/23 Appointment Marcelino Nurse, MD [facility]  11/02/23 Appointment Marcelino Nurse, MD [facility]  Showing future appointments within next 90 days and meeting all other requirements  I discussed the assessment and treatment plan with the patient. The patient was provided an opportunity to ask questions and all were answered. The patient agreed with the plan and demonstrated an understanding of the instructions.  Patient advised to call back or seek an in-person evaluation if the symptoms or condition worsens.  Duration of encounter: .  Total time on encounter, as per AMA guidelines included both the face-to-face and non-face-to-face time personally spent by the physician and/or other qualified health care professional(s) on the day of the encounter (includes time in activities that require the physician or other qualified health care professional and does not include time in activities normally performed  by clinical staff). Physician's time may include the following activities when performed: Preparing to see the patient (e.g., pre-charting review of records, searching for previously ordered imaging, lab work, and nerve conduction tests) Review of prior analgesic pharmacotherapies. Reviewing PMP Interpreting ordered tests (e.g., lab work, imaging, nerve conduction tests) Performing post-procedure evaluations, including interpretation of diagnostic procedures Obtaining and/or reviewing separately obtained history Performing a medically appropriate examination and/or evaluation Counseling and educating the patient/family/caregiver Ordering medications, tests, or procedures Referring and communicating with other health care professionals (when not separately reported) Documenting clinical information in the electronic or other health record Independently interpreting results (not separately  reported) and communicating results to the patient/ family/caregiver Care coordination (not separately reported)  Note by: Wallie Sherry, MD (TTS and AI technology used. I apologize for any typographical errors that were not detected and corrected.) Date: 10/20/2023; Time: 10:50 AM

## 2023-10-20 NOTE — Patient Instructions (Signed)

## 2023-10-21 ENCOUNTER — Ambulatory Visit
Admission: RE | Admit: 2023-10-21 | Discharge: 2023-10-21 | Disposition: A | Discharge status: Home / Self Care | Source: Ambulatory Visit | Attending: Student in an Organized Health Care Education/Training Program | Admitting: Student in an Organized Health Care Education/Training Program

## 2023-10-21 ENCOUNTER — Ambulatory Visit (HOSPITAL_BASED_OUTPATIENT_CLINIC_OR_DEPARTMENT_OTHER): Admitting: Student in an Organized Health Care Education/Training Program

## 2023-10-21 ENCOUNTER — Encounter: Payer: Self-pay | Admitting: Student in an Organized Health Care Education/Training Program

## 2023-10-21 VITALS — BP 152/94 | HR 67 | Temp 98.0°F | Resp 19 | Ht 68.0 in | Wt 224.0 lb

## 2023-10-21 DIAGNOSIS — M5416 Radiculopathy, lumbar region: Secondary | ICD-10-CM

## 2023-10-21 MED ORDER — IOHEXOL 180 MG/ML  SOLN
10.0000 mL | Freq: Once | INTRAMUSCULAR | Status: AC
  Administered 2023-10-21: 10 mL via EPIDURAL

## 2023-10-21 MED ORDER — ROPIVACAINE HCL 2 MG/ML IJ SOLN
2.0000 mL | Freq: Once | INTRAMUSCULAR | Status: AC
  Administered 2023-10-21: 2 mL via EPIDURAL

## 2023-10-21 MED ORDER — LIDOCAINE HCL (PF) 2 % IJ SOLN
INTRAMUSCULAR | Status: AC
  Filled 2023-10-21: qty 10

## 2023-10-21 MED ORDER — DEXAMETHASONE SODIUM PHOSPHATE 10 MG/ML IJ SOLN
10.0000 mg | Freq: Once | INTRAMUSCULAR | Status: AC
  Administered 2023-10-21: 10 mg

## 2023-10-21 MED ORDER — SODIUM CHLORIDE 0.9% FLUSH
2.0000 mL | Freq: Once | INTRAVENOUS | Status: AC
  Administered 2023-10-21: 2 mL

## 2023-10-21 MED ORDER — DEXAMETHASONE SODIUM PHOSPHATE 10 MG/ML IJ SOLN
INTRAMUSCULAR | Status: AC
  Filled 2023-10-21: qty 1

## 2023-10-21 MED ORDER — IOHEXOL 180 MG/ML  SOLN
INTRAMUSCULAR | Status: AC
  Filled 2023-10-21: qty 20

## 2023-10-21 MED ORDER — ROPIVACAINE HCL 2 MG/ML IJ SOLN
INTRAMUSCULAR | Status: AC
  Filled 2023-10-21: qty 20

## 2023-10-21 MED ORDER — SODIUM CHLORIDE (PF) 0.9 % IJ SOLN
INTRAMUSCULAR | Status: AC
  Filled 2023-10-21: qty 10

## 2023-10-21 MED ORDER — LIDOCAINE HCL 2 % IJ SOLN
20.0000 mL | Freq: Once | INTRAMUSCULAR | Status: AC
  Administered 2023-10-21: 200 mg

## 2023-10-21 NOTE — Patient Instructions (Signed)

## 2023-10-21 NOTE — Progress Notes (Signed)
 PROVIDER NOTE: Interpretation of information contained herein should be left to medically-trained personnel. Specific patient instructions are provided elsewhere under Patient Instructions section of medical record. This document was created in part using STT-dictation technology, any transcriptional errors that may result from this process are unintentional.  Patient: Evan Cherry Type: Established DOB: 28-Jul-1965 MRN: [id number] PCP: Iven Harlene CROME, MD  Service: Procedure DOS: 10/21/2023 Setting: Ambulatory Location: Ambulatory outpatient facility Delivery: Face-to-face Provider: Wallie Sherry, MD Specialty: Interventional Pain Management Specialty designation: 09 Location: Outpatient facility Ref. Prov.: Iven Harlene CROME, MD       Interventional Therapy   Type: Lumbar epidural steroid injection (LESI) (interlaminar)  Laterality: Right   Level:  L4-5 Level.  Imaging: Fluoroscopic guidance         Anesthesia: Local anesthesia (1-2% Lidocaine ) DOS: 10/21/2023  Performed by: Wallie Sherry, MD  Purpose: Diagnostic/Therapeutic Indications: Lumbar radicular pain of intraspinal etiology of more than 4 weeks that has failed to respond to conservative therapy and is severe enough to impact quality of life or function. 1. Lumbar radicular pain (RIGHT L4/5)    NAS-11 Pain score:   Pre-procedure: 7 /10   Post-procedure: 3 /10      Position / Prep / Materials:  Position: Prone w/ head of the table raised (slight reverse trendelenburg) to facilitate breathing.  Prep solution: ChloraPrep (2% chlorhexidine gluconate and 70% isopropyl alcohol) Prep Area: Entire Posterior Lumbar Region from lower scapular tip down to mid buttocks area and from flank to flank. Materials:  Tray: Epidural tray Needle(s):  Type: Epidural needle (Tuohy) Gauge (G):  22 Length: Regular (3.5-in) Qty: 1  H&P (Pre-op Assessment):  Evan Cherry is a 58 y.o. (year old), male patient, seen today for interventional  treatment. He  has a past surgical history that includes Colon surgery. Evan Cherry has a current medication list which includes the following prescription(s): acetaminophen , albuterol, albuterol, azelastine, benazepril , fasenra, cyanocobalamin, diclofenac sodium, gabapentin , hydrochlorothiazide , loratadine, morphine, naproxen, testosterone  cypionate, tiotropium bromide monohydrate, tramadol , budesonide-formoterol, carboxymethylcellulose sodium, cholecalciferol, ciclesonide, cyanocobalamin, cyclobenzaprine, doxycycline, dupilumab, epinephrine, guaifenesin, hydrocodone -acetaminophen , hydrocodone -acetaminophen , lidocaine , meloxicam, multiple vitamin, naproxen sodium, pravastatin, prednisone, tiotropium, and tizanidine . His primarily concern today is the Back Pain  Initial Vital Signs:  Pulse/HCG Rate: 67ECG Heart Rate: 68 Temp: 98 F (36.7 C) Resp: 16 BP: 124/66 SpO2: 99 %  BMI: Estimated body mass index is 34.06 kg/m as calculated from the following:   Height as of this encounter: 5' 8 (1.727 m).   Weight as of this encounter: 224 lb (101.6 kg).  Risk Assessment: Allergies: Reviewed. He has no known allergies.  Allergy Precautions: None required Coagulopathies: Reviewed. None identified.  Blood-thinner therapy: None at this time Active Infection(s): Reviewed. None identified. Evan Cherry is afebrile  Site Confirmation: Evan Cherry was asked to confirm the procedure and laterality before marking the site Procedure checklist: Completed Consent: Before the procedure and under the influence of no sedative(s), amnesic(s), or anxiolytics, the patient was informed of the treatment options, risks and possible complications. To fulfill our ethical and legal obligations, as recommended by the American Medical Association's Code of Ethics, I have informed the patient of my clinical impression; the nature and purpose of the treatment or procedure; the risks, benefits, and possible complications of the  intervention; the alternatives, including doing nothing; the risk(s) and benefit(s) of the alternative treatment(s) or procedure(s); and the risk(s) and benefit(s) of doing nothing. The patient was provided information about the general risks and possible complications associated with the procedure. These may  include, but are not limited to: failure to achieve desired goals, infection, bleeding, organ or nerve damage, allergic reactions, paralysis, and death. In addition, the patient was informed of those risks and complications associated to Spine-related procedures, such as failure to decrease pain; infection (i.e.: Meningitis, epidural or intraspinal abscess); bleeding (i.e.: epidural hematoma, subarachnoid hemorrhage, or any other type of intraspinal or peri-dural bleeding); organ or nerve damage (i.e.: Any type of peripheral nerve, nerve root, or spinal cord injury) with subsequent damage to sensory, motor, and/or autonomic systems, resulting in permanent pain, numbness, and/or weakness of one or several areas of the body; allergic reactions; (i.e.: anaphylactic reaction); and/or death. Furthermore, the patient was informed of those risks and complications associated with the medications. These include, but are not limited to: allergic reactions (i.e.: anaphylactic or anaphylactoid reaction(s)); adrenal axis suppression; blood sugar elevation that in diabetics may result in ketoacidosis or comma; water retention that in patients with history of congestive heart failure may result in shortness of breath, pulmonary edema, and decompensation with resultant heart failure; weight gain; swelling or edema; medication-induced neural toxicity; particulate matter embolism and blood vessel occlusion with resultant organ, and/or nervous system infarction; and/or aseptic necrosis of one or more joints. Finally, the patient was informed that Medicine is not an exact science; therefore, there is also the possibility of  unforeseen or unpredictable risks and/or possible complications that may result in a catastrophic outcome. The patient indicated having understood very clearly. We have given the patient no guarantees and we have made no promises. Enough time was given to the patient to ask questions, all of which were answered to the patient's satisfaction. Evan Cherry has indicated that he wanted to continue with the procedure. Attestation: I, the ordering provider, attest that I have discussed with the patient the benefits, risks, side-effects, alternatives, likelihood of achieving goals, and potential problems during recovery for the procedure that I have provided informed consent. Date  Time: 10/21/2023  8:08 AM  Pre-Procedure Preparation:  Monitoring: As per clinic protocol. Respiration, ETCO2, SpO2, BP, heart rate and rhythm monitor placed and checked for adequate function Safety Precautions: Patient was assessed for positional comfort and pressure points before starting the procedure. Time-out: I initiated and conducted the Time-out before starting the procedure, as per protocol. The patient was asked to participate by confirming the accuracy of the Time Out information. Verification of the correct person, site, and procedure were performed and confirmed by me, the nursing staff, and the patient. Time-out conducted as per Joint Commission's Universal Protocol (UP.01.01.01). Time: 0825 Start Time: 0825 hrs.  Description/Narrative of Procedure:          Target: Epidural space via interlaminar opening, initially targeting the lower laminar border of the superior vertebral body. Region: Lumbar Approach: Percutaneous paravertebral  Rationale (medical necessity): procedure needed and proper for the diagnosis and/or treatment of the patient's medical symptoms and needs. Procedural Technique Safety Precautions: Aspiration looking for blood return was conducted prior to all injections. At no point did we inject  any substances, as a needle was being advanced. No attempts were made at seeking any paresthesias. Safe injection practices and needle disposal techniques used. Medications properly checked for expiration dates. SDV (single dose vial) medications used. Description of the Procedure: Protocol guidelines were followed. The procedure needle was introduced through the skin, ipsilateral to the reported pain, and advanced to the target area. Bone was contacted and the needle walked caudad, until the lamina was cleared. The epidural space was identified using "  loss-of-resistance technique" with 2-3 ml of PF-NaCl (0.9% NSS), in a 5cc LOR glass syringe.  Vitals:   10/21/23 0809 10/21/23 0821 10/21/23 0826 10/21/23 0830  BP: 124/66 (!) 142/85 (!) 147/101 (!) 152/94  Pulse: 67     Resp: 16 16 16 19   Temp: 98 F (36.7 C)     TempSrc: Temporal     SpO2: 99% 98% 98% 97%  Weight: 224 lb (101.6 kg)     Height: 5' 8 (1.727 m)       Start Time: 0825 hrs. End Time: 0827 hrs.  Imaging Guidance (Spinal):          Type of Imaging Technique: Fluoroscopy Guidance (Spinal) Indication(s): Fluoroscopy guidance for needle placement to enhance accuracy in procedures requiring precise needle localization for targeted delivery of medication in or near specific anatomical locations not easily accessible without such real-time imaging assistance. Exposure Time: Please see nurses notes. Contrast: Before injecting any contrast, we confirmed that the patient did not have an allergy to iodine, shellfish, or radiological contrast. Once satisfactory needle placement was completed at the desired level, radiological contrast was injected. Contrast injected under live fluoroscopy. No contrast complications. See chart for type and volume of contrast used. Fluoroscopic Guidance: I was personally present during the use of fluoroscopy. Tunnel Vision Technique used to obtain the best possible view of the target area. Parallax error  corrected before commencing the procedure. Direction-depth-direction technique used to introduce the needle under continuous pulsed fluoroscopy. Once target was reached, antero-posterior, oblique, and lateral fluoroscopic projection used confirm needle placement in all planes. Images permanently stored in EMR. Interpretation: I personally interpreted the imaging intraoperatively. Adequate needle placement confirmed in multiple planes. Appropriate spread of contrast into desired area was observed. No evidence of afferent or efferent intravascular uptake. No intrathecal or subarachnoid spread observed. Permanent images saved into the patient's record.  Antibiotic Prophylaxis:   Anti-infectives (From admission, onward)    None      Indication(s): None identified  Post-operative Assessment:  Post-procedure Vital Signs:  Pulse/HCG Rate: 6773 Temp: 98 F (36.7 C) Resp: 19 BP: (!) 152/94 SpO2: 97 %  EBL: None  Complications: No immediate post-treatment complications observed by team, or reported by patient.  Note: The patient tolerated the entire procedure well. A repeat set of vitals were taken after the procedure and the patient was kept under observation following institutional policy, for this type of procedure. Post-procedural neurological assessment was performed, showing return to baseline, prior to discharge. The patient was provided with post-procedure discharge instructions, including a section on how to identify potential problems. Should any problems arise concerning this procedure, the patient was given instructions to immediately contact us , at any time, without hesitation. In any case, we plan to contact the patient by telephone for a follow-up status report regarding this interventional procedure.  Comments:  No additional relevant information.  Plan of Care (POC)  Orders:  Orders Placed This Encounter  Procedures   DG PAIN CLINIC C-ARM 1-60 MIN NO REPORT     Intraoperative interpretation by procedural physician at [facility].    Standing Status:   Standing    Number of Occurrences:   1    Reason for exam::   Assistance in needle guidance and placement for procedures requiring needle placement in or near specific anatomical locations not easily accessible without such assistance.    Medications ordered for procedure: Meds ordered this encounter  Medications   iohexol  (OMNIPAQUE ) 180 MG/ML injection 10 mL  Must be Myelogram-compatible. If not available, you may substitute with a water-soluble, non-ionic, hypoallergenic, myelogram-compatible radiological contrast medium.   lidocaine  (XYLOCAINE ) 2 % (with pres) injection 400 mg   ropivacaine  (PF) 2 mg/mL (0.2%) (NAROPIN ) injection 2 mL   sodium chloride  flush (NS) 0.9 % injection 2 mL   dexamethasone  (DECADRON ) injection 10 mg   Medications administered: We administered iohexol , lidocaine , ropivacaine  (PF) 2 mg/mL (0.2%), sodium chloride  flush, and dexamethasone .  See the medical record for exact dosing, route, and time of administration.    B/L IA Knee steroid 12/12/19, 07/29/21: Helpful,  T10, 11, 12 MBB on 12/19/2019 followed by RFA (previosly done with Dr Mindi in 2018), status post bilateral T10, T11, T12 thoracic facet medial branch nerve block 8/23/ 2021 which provided 75% pain relief for 3-1/2 weeks with gradual return of pain thereafter.  Right T10, T11, T12 RFA on 01/30/2020; left T10, T11, T12 RFA on 02/20/2020, 12/05/20, 07/29/20, Left L4/5 ESI 05/05/22, right C7/T1 ESI and right SSNB 05/26/22, right l4/5 ESI 10/21/23    Follow-up plan:   Return for Keep sch. appt.     Recent Visits Date Type Provider Dept  10/20/23 Office Visit Marcelino Nurse, MD [facility]  Showing recent visits within past 90 days and meeting all other requirements Today's Visits Date Type Provider Dept  10/21/23 Procedure visit Marcelino Nurse, MD [facility]  Showing today's visits and  meeting all other requirements Future Appointments Date Type Provider Dept  11/02/23 Appointment Marcelino Nurse, MD [facility]  Showing future appointments within next 90 days and meeting all other requirements   Disposition: Discharge home  Discharge (Date  Time): 10/21/2023; 0833 hrs.   Primary Care Physician: Iven Harlene CROME, MD Location: [facility] Note by: Nurse Marcelino, MD (TTS technology used. I apologize for any typographical errors that were not detected and corrected.) Date: 10/21/2023; Time: 8:53 AM  Disclaimer:  Medicine is not an Visual merchandiser. The only guarantee in medicine is that nothing is guaranteed. It is important to note that the decision to proceed with this intervention was based on the information collected from the patient. The Data and conclusions were drawn from the patient's questionnaire, the interview, and the physical examination. Because the information was provided in large part by the patient, it cannot be guaranteed that it has not been purposely or unconsciously manipulated. Every effort has been made to obtain as much relevant data as possible for this evaluation. It is important to note that the conclusions that lead to this procedure are derived in large part from the available data. Always take into account that the treatment will also be dependent on availability of resources and existing treatment guidelines, considered by other Pain Management Practitioners as being common knowledge and practice, at the time of the intervention. For Medico-Legal purposes, it is also important to point out that variation in procedural techniques and pharmacological choices are the acceptable norm. The indications, contraindications, technique, and results of the above procedure should only be interpreted and judged by a Board-Certified Interventional Pain Specialist with extensive familiarity and expertise in the same exact procedure  and technique.

## 2023-10-21 NOTE — Progress Notes (Signed)
 Safety precautions to be maintained throughout the outpatient stay will include: orient to surroundings, keep bed in low position, maintain call bell within reach at all times, provide assistance with transfer out of bed and ambulation.

## 2023-10-22 ENCOUNTER — Telehealth: Payer: Self-pay

## 2023-10-22 NOTE — Telephone Encounter (Signed)
 Post procedure follow up.  LM

## 2023-11-02 ENCOUNTER — Encounter: Payer: Self-pay | Admitting: Student in an Organized Health Care Education/Training Program

## 2023-11-02 ENCOUNTER — Ambulatory Visit (HOSPITAL_BASED_OUTPATIENT_CLINIC_OR_DEPARTMENT_OTHER): Admitting: Student in an Organized Health Care Education/Training Program

## 2023-11-02 ENCOUNTER — Ambulatory Visit
Admission: RE | Admit: 2023-11-02 | Discharge: 2023-11-02 | Disposition: A | Discharge status: Home / Self Care | Source: Ambulatory Visit | Attending: Student in an Organized Health Care Education/Training Program | Admitting: Student in an Organized Health Care Education/Training Program

## 2023-11-02 VITALS — BP 133/80 | HR 65 | Temp 97.9°F | Resp 15 | Ht 68.0 in | Wt 225.0 lb

## 2023-11-02 DIAGNOSIS — M47894 Other spondylosis, thoracic region: Secondary | ICD-10-CM | POA: Diagnosis present

## 2023-11-02 DIAGNOSIS — G894 Chronic pain syndrome: Secondary | ICD-10-CM | POA: Insufficient documentation

## 2023-11-02 DIAGNOSIS — M47814 Spondylosis without myelopathy or radiculopathy, thoracic region: Secondary | ICD-10-CM

## 2023-11-02 MED ORDER — LACTATED RINGERS IV SOLN: Freq: Once | INTRAVENOUS | Status: AC

## 2023-11-02 MED ORDER — ROPIVACAINE HCL 2 MG/ML IJ SOLN
18.0000 mL | Freq: Once | INTRAMUSCULAR | Status: AC
  Administered 2023-11-02: 18 mL via PERINEURAL
  Filled 2023-11-02: qty 20

## 2023-11-02 MED ORDER — DEXAMETHASONE SODIUM PHOSPHATE 10 MG/ML IJ SOLN
20.0000 mg | Freq: Once | INTRAMUSCULAR | Status: AC
  Administered 2023-11-02: 20 mg
  Filled 2023-11-02: qty 2

## 2023-11-02 MED ORDER — MIDAZOLAM HCL 2 MG/2ML IJ SOLN
0.5000 mg | Freq: Once | INTRAMUSCULAR | Status: AC
  Administered 2023-11-02: 2 mg via INTRAVENOUS
  Filled 2023-11-02: qty 2

## 2023-11-02 MED ORDER — LIDOCAINE HCL 2 % IJ SOLN
20.0000 mL | Freq: Once | INTRAMUSCULAR | Status: AC
  Administered 2023-11-02: 400 mg
  Filled 2023-11-02: qty 20

## 2023-11-02 NOTE — Progress Notes (Signed)
 PROVIDER NOTE: Information contained herein reflects review and annotations entered in association with encounter. Interpretation of such information and data should be left to medically-trained personnel. Information provided to patient can be located elsewhere in the medical record under Patient Instructions. Document created using STT-dictation technology, any transcriptional errors that may result from process are unintentional.    Patient: Evan Cherry  Service Category: Procedure  Provider: Wallie Sherry, MD  DOB: 09-12-1965  DOS: 11/02/2023  Location: [facility]  MRN: [id number]  Setting: Ambulatory - outpatient  Referring Provider: Iven Harlene CROME, MD  Type: Established Patient  Specialty: Interventional Pain Management  PCP: Iven Harlene CROME, MD   Primary Reason for Visit: Interventional Pain Management Treatment. CC: Back Pain   Procedure:          Anesthesia, Analgesia, Anxiolysis:  Type: Therapeutic Thoracic Medial Nerve Radiofrequency Ablation  #4  Region: Posterior   Level: T10-11, T11-12, and T12-L1 Medial branch levels  blocking the T10, T11, T12, and L1 Medial Branch nerves Laterality: Bilateral Paraspinal  Type: Minimal sedation with IV versed  2 mg x1 Indication(s): Analgesia and Anxiety   Sedation: Meaningful verbal contact was maintained at all times during the procedure  Local Anesthetic: Lidocaine  1-2%  Position: Prone   Indications: 1. Thoracic facet joint syndrome   2. Thoracic spondylosis without myelopathy   3. Chronic pain syndrome      Evan Cherry has been dealing with the above chronic pain for longer than three months and has either failed to respond, was unable to tolerate, or simply did not get enough benefit from other more conservative therapies including, but not limited to: 1. Over-the-counter medications 2. Anti-inflammatory medications 3. Muscle relaxants 4. Membrane stabilizers 5. Opioids 6. Physical therapy and/or  chiropractic manipulation 7. Modalities (Heat, ice, etc.) 8. Invasive techniques such as nerve blocks. Evan Cherry has attained more than 50% relief of the pain from a series of diagnostic injections conducted in separate occasions.  Pain Score: Pre-procedure: 6 /10 Post-procedure: 8 /10  Pre-op Assessment:  Evan Cherry is a 58 y.o. (year old), male patient, seen today for interventional treatment. He  has a past surgical history that includes Colon surgery. Evan Cherry has a current medication list which includes the following prescription(s): acetaminophen , albuterol, albuterol, azelastine, benazepril , fasenra, cyanocobalamin, diclofenac sodium, epinephrine, gabapentin , hydrochlorothiazide , loratadine, morphine, naproxen, testosterone  cypionate, tiotropium bromide monohydrate, tramadol , budesonide-formoterol, carboxymethylcellulose sodium, cholecalciferol, ciclesonide, cyanocobalamin, cyclobenzaprine, doxycycline, dupilumab, guaifenesin, hydrocodone -acetaminophen , hydrocodone -acetaminophen , lidocaine , meloxicam, multiple vitamin, naproxen sodium, pravastatin, prednisone, tiotropium, and tizanidine , and the following Facility-Administered Medications: lactated ringers . His primarily concern today is the Back Pain   Initial Vital Signs:  Pulse/HCG Rate: 65ECG Cherry Rate: 61 Temp: 97.9 F (36.6 C) Resp: 16 BP: 132/65 SpO2: 100 %  BMI: Estimated body mass index is 34.21 kg/m as calculated from the following:   Height as of this encounter: 5' 8 (1.727 m).   Weight as of this encounter: 225 lb (102.1 kg).  Risk Assessment: Allergies: Reviewed. He has no known allergies.  Allergy Precautions: None required Coagulopathies: Reviewed. None identified.  Blood-thinner therapy: None at this time Active Infection(s): Reviewed. None identified. Evan Cherry is afebrile  Site Confirmation: Evan Cherry was asked to confirm the procedure and laterality before marking the site Procedure checklist:  Completed Consent: Before the procedure and under the influence of no sedative(s), amnesic(s), or anxiolytics, the patient was informed of the treatment options, risks and possible complications. To fulfill our ethical and legal obligations, as recommended by the  American Medical Association's Code of Ethics, I have informed the patient of my clinical impression; the nature and purpose of the treatment or procedure; the risks, benefits, and possible complications of the intervention; the alternatives, including doing nothing; the risk(s) and benefit(s) of the alternative treatment(s) or procedure(s); and the risk(s) and benefit(s) of doing nothing. The patient was provided information about the general risks and possible complications associated with the procedure. These may include, but are not limited to: failure to achieve desired goals, infection, bleeding, organ or nerve damage, allergic reactions, paralysis, and death. In addition, the patient was informed of those risks and complications associated to Spine-related procedures, such as failure to decrease pain; infection (i.e.: Meningitis, epidural or intraspinal abscess); bleeding (i.e.: epidural hematoma, subarachnoid hemorrhage, or any other type of intraspinal or peri-dural bleeding); organ or nerve damage (i.e.: Any type of peripheral nerve, nerve root, or spinal cord injury) with subsequent damage to sensory, motor, and/or autonomic systems, resulting in permanent pain, numbness, and/or weakness of one or several areas of the body; allergic reactions; (i.e.: anaphylactic reaction); and/or death. Furthermore, the patient was informed of those risks and complications associated with the medications. These include, but are not limited to: allergic reactions (i.e.: anaphylactic or anaphylactoid reaction(s)); adrenal axis suppression; blood sugar elevation that in diabetics may result in ketoacidosis or comma; water retention that in patients with history  of congestive Cherry failure may result in shortness of breath, pulmonary edema, and decompensation with resultant Cherry failure; weight gain; swelling or edema; medication-induced neural toxicity; particulate matter embolism and blood vessel occlusion with resultant organ, and/or nervous system infarction; and/or aseptic necrosis of one or more joints. Finally, the patient was informed that Medicine is not an exact science; therefore, there is also the possibility of unforeseen or unpredictable risks and/or possible complications that may result in a catastrophic outcome. The patient indicated having understood very clearly. We have given the patient no guarantees and we have made no promises. Enough time was given to the patient to ask questions, all of which were answered to the patient's satisfaction. Evan Cherry has indicated that he wanted to continue with the procedure. Attestation: I, the ordering provider, attest that I have discussed with the patient the benefits, risks, side-effects, alternatives, likelihood of achieving goals, and potential problems during recovery for the procedure that I have provided informed consent. Date  Time: 11/02/2023 10:55 AM  Pre-Procedure Preparation:  Monitoring: As per clinic protocol. Respiration, ETCO2, SpO2, BP, Cherry rate and rhythm monitor placed and checked for adequate function Safety Precautions: Patient was assessed for positional comfort and pressure points before starting the procedure. Time-out: I initiated and conducted the Time-out before starting the procedure, as per protocol. The patient was asked to participate by confirming the accuracy of the Time Out information. Verification of the correct person, site, and procedure were performed and confirmed by me, the nursing staff, and the patient. Time-out conducted as per Joint Commission's Universal Protocol (UP.01.01.01). Time: 1221  Description of Procedure:          Target Area: For thoracic  Facet block(s), the target is the groove formed by the junction of the transverse process and superior articular process.  Approach: Paraspinal approach. Area Prepped: Entire Upper Back Area DuraPrep (Iodine Povacrylex [0.7% available iodine] and Isopropyl Alcohol, 74% w/w) Safety Precautions: Aspiration looking for blood return was conducted prior to all injections. At no point did we inject any substances, as a needle was being advanced. No attempts were made at  seeking any paresthesias. Safe injection practices and needle disposal techniques used. Medications properly checked for expiration dates. SDV (single dose vial) medications used. Description of the Procedure: Protocol guidelines were followed. The patient was placed in position over the procedure table. The target area was identified and the area prepped in the usual manner. The skin and muscle were infiltrated with local anesthetic. Appropriate amount of time allowed to pass for local anesthetics to take effect. Radiofrequency needles were introduced to the target area using fluoroscopic guidance. Using the NeuroTherm NT1100 Radiofrequency Generator, sensory stimulation using 50 Hz was used to locate & identify the nerve, making sure that the needle was positioned such that there was no sensory stimulation below 0.3 V or above 0.7 V. Stimulation using 2 Hz was used to evaluate the motor component. Care was taken not to lesion any nerves that demonstrated motor stimulation of the lower extremities at an output of less than 2.5 times that of the sensory threshold, or a maximum of 2.0 V. Once satisfactory placement of the needles was achieved, the numbing solution was slowly injected after negative aspiration. After waiting for at least 2 minutes, the ablation was performed at 80 degrees C for 60 seconds, using regular Radiofrequency settings. Once the procedure was completed, the needles were then removed and the area cleansed, making sure to leave  some of the prepping solution back to take advantage of its long term bactericidal properties. Intra-operative Compliance: Compliant        Vitals:   11/02/23 1240 11/02/23 1244 11/02/23 1250 11/02/23 1252  BP: (!) 141/76 128/83 (!) 142/91 133/80  Pulse:      Resp: 18 16 16 15   Temp:      SpO2: 96% 94% 95% 94%  Weight:      Height:         Start Time: 1221 hrs. End Time: 1251 hrs. Materials & Medications:  Needle(s) Type: Teflon-coated, curved tip, Radiofrequency needle(s) Gauge: 22G Length: 10cm Medication(s): Please see orders for medications and dosing details.  8 cc solution made of 7 cc of 0.2% ropivacaine , 1 cc of Decadron  10 mg/cc.  2 cc injected at each level on the right after sensorimotor testing, prior to lesioning. 8 cc solution made of 7 cc of 0.2% ropivacaine , 1 cc of Decadron  10 mg/cc.  2 cc injected at each level on the left  after sensorimotor testing, prior to lesioning. Imaging Guidance (Spinal):          Type of Imaging Technique: Fluoroscopy Guidance (Spinal) Indication(s): Assistance in needle guidance and placement for procedures requiring needle placement in or near specific anatomical locations not easily accessible without such assistance. Exposure Time: Please see nurses notes. Contrast: None used. Fluoroscopic Guidance: I was personally present during the use of fluoroscopy. Tunnel Vision Technique used to obtain the best possible view of the target area. Parallax error corrected before commencing the procedure. Direction-depth-direction technique used to introduce the needle under continuous pulsed fluoroscopy. Once target was reached, antero-posterior, oblique, and lateral fluoroscopic projection used confirm needle placement in all planes. Images permanently stored in EMR. Interpretation: No contrast injected. I personally interpreted the imaging intraoperatively. Adequate needle placement confirmed in multiple planes. Permanent images saved into the  patient's record.    Post-operative Assessment:  Post-procedure Vital Signs:  Pulse/HCG Rate: 6566 Temp: 97.9 F (36.6 C) Resp: 15 BP: 133/80 SpO2: 94 %  EBL: None  Complications: No immediate post-treatment complications observed by team, or reported by patient.  Note: The patient  tolerated the entire procedure well. A repeat set of vitals were taken after the procedure and the patient was kept under observation following institutional policy, for this type of procedure. Post-procedural neurological assessment was performed, showing return to baseline, prior to discharge. The patient was provided with post-procedure discharge instructions, including a section on how to identify potential problems. Should any problems arise concerning this procedure, the patient was given instructions to immediately contact us , at any time, without hesitation. In any case, we plan to contact the patient by telephone for a follow-up status report regarding this interventional procedure.  Comments:  No additional relevant information.  Plan of Care    Orders:  Orders Placed This Encounter  Procedures   DG PAIN CLIN[facility] C-ARM 1-60 MIN NO REPORT    Intraoperative interpretation by procedural physician at [facility].    Standing Status:   Standing    Number of Occurrences:   1    Reason for exam::   Assistance in needle guidance and placement for procedures requiring needle placement in or near specific anatomical locations not easily accessible without such assistance.    Medications ordered for procedure: Meds ordered this encounter  Medications   lidocaine  (XYLOCAINE ) 2 % (with pres) injection 400 mg   lactated ringers  infusion   midazolam  (VERSED ) injection 0.5-2 mg    Make sure Flumazenil is available in the pyxis when using this medication. If oversedation occurs, administer 0.2 mg IV over 15 sec. If after 45 sec no response, administer 0.2 mg again over 1 min; may repeat at 1 min  intervals; not to exceed 4 doses (1 mg)   ropivacaine  (PF) 2 mg/mL (0.2%) (NAROPIN ) injection 18 mL   dexamethasone  (DECADRON ) injection 20 mg    Medications administered: We administered lidocaine , lactated ringers , midazolam , ropivacaine  (PF) 2 mg/mL (0.2%), and dexamethasone .  See the medical record for exact dosing, route, and time of administration.  Follow-up plan:   Return in about 5 weeks (around 12/07/2023) for PPE in person.     Recent Visits Date Type Provider Dept  10/21/23 Procedure visit Marcelino Nurse, MD [facility]  10/20/23 Office Visit Marcelino Nurse, MD [facility]  Showing recent visits within past 90 days and meeting all other requirements Today's Visits Date Type Provider Dept  11/02/23 Procedure visit Marcelino Nurse, MD [facility]  Showing today's visits and meeting all other requirements Future Appointments Date Type Provider Dept  12/08/23 Appointment Marcelino Nurse, MD [facility]  Showing future appointments within next 90 days and meeting all other requirements  Disposition: Discharge home  Discharge (Date  Time): 11/02/2023;   hrs.   Primary Care Physician: Iven Harlene CROME, MD Location: [facility] Note by: Nurse Marcelino, MD Date: 11/02/2023; Time: 1:04 PM  Disclaimer:  Medicine is not an exact science. The only guarantee in medicine is that nothing is guaranteed. It is important to note that the decision to proceed with this intervention was based on the information collected from the patient. The Data and conclusions were drawn from the patient's questionnaire, the interview, and the physical examination. Because the information was provided in large part by the patient, it cannot be guaranteed that it has not been purposely or unconsciously manipulated. Every effort has been made to obtain as much relevant data as possible for this evaluation. It is important to note that the  conclusions that lead to this procedure are derived in large part from the available data. Always  take into account that the treatment will also be dependent on availability of resources and existing treatment guidelines, considered by other Pain Management Practitioners as being common knowledge and practice, at the time of the intervention. For Medico-Legal purposes, it is also important to point out that variation in procedural techniques and pharmacological choices are the acceptable norm. The indications, contraindications, technique, and results of the above procedure should only be interpreted and judged by a Board-Certified Interventional Pain Specialist with extensive familiarity and expertise in the same exact procedure and technique.

## 2023-11-02 NOTE — Progress Notes (Signed)
 Safety precautions to be maintained throughout the outpatient stay will include: orient to surroundings, keep bed in low position, maintain call bell within reach at all times, provide assistance with transfer out of bed and ambulation.

## 2023-11-02 NOTE — Patient Instructions (Signed)

## 2023-12-08 ENCOUNTER — Encounter: Payer: Self-pay | Admitting: Student in an Organized Health Care Education/Training Program

## 2023-12-08 ENCOUNTER — Ambulatory Visit
Attending: Student in an Organized Health Care Education/Training Program | Admitting: Student in an Organized Health Care Education/Training Program

## 2023-12-08 VITALS — BP 140/69 | HR 84 | Temp 96.8°F | Resp 16 | Ht 68.0 in | Wt 226.0 lb

## 2023-12-08 DIAGNOSIS — M47894 Other spondylosis, thoracic region: Secondary | ICD-10-CM | POA: Insufficient documentation

## 2023-12-08 DIAGNOSIS — M5416 Radiculopathy, lumbar region: Secondary | ICD-10-CM | POA: Insufficient documentation

## 2023-12-08 DIAGNOSIS — M5126 Other intervertebral disc displacement, lumbar region: Secondary | ICD-10-CM | POA: Diagnosis present

## 2023-12-08 DIAGNOSIS — G894 Chronic pain syndrome: Secondary | ICD-10-CM | POA: Diagnosis present

## 2023-12-08 DIAGNOSIS — M47814 Spondylosis without myelopathy or radiculopathy, thoracic region: Secondary | ICD-10-CM | POA: Diagnosis present

## 2023-12-08 NOTE — Progress Notes (Signed)
 PROVIDER NOTE: Interpretation of information contained herein should be left to medically-trained personnel. Specific patient instructions are provided elsewhere under Patient Instructions section of medical record. This document was created in part using AI and STT-dictation technology, any transcriptional errors that may result from this process are unintentional.  Patient: Evan Cherry  Service: E/M   PCP: Iven Harlene CROME, MD  DOB: 1966-03-06  DOS: 12/08/2023  Provider: Wallie Sherry, MD  MRN: [id number]  Delivery: Face-to-face  Specialty: Interventional Pain Management  Type: Established Patient  Setting: Ambulatory outpatient facility  Specialty designation: 09  Referring Prov.: Iven Harlene CROME, MD  Location: Outpatient office facility       History of present illness (HPI) [name], a 58 y.o. year old male, is here today because of his Thoracic facet joint syndrome [M47.894]. Mr. [facility] primary complain today is Back Pain (lower)  Pertinent problems: Mr. Nazir has Bilateral post-traumatic osteoarthritis of knee; Chronic pain of both knees; Thoracic facet joint syndrome; Thoracic spondylosis without myelopathy; and Lumbar facet arthropathy on their pertinent problem list.  Pain Assessment: Severity of Chronic pain is reported as a 6 /10. Location: Back Lower/right leg to the knee. Onset: More than a month ago. Quality: [place], Burning. Timing: Constant. Modifying factor(s): ice, medications. Vitals:  height is 5' 8 (1.727 m) and weight is 226 lb (102.5 kg). His temporal temperature is 96.8 F (36 C) (abnormal). His blood pressure is 140/69 (abnormal) and his pulse is 84. His respiration is 16 and oxygen saturation is 96%.  BMI: Estimated body mass index is 34.36 kg/m as calculated from the following:   Height as of this encounter: 5' 8 (1.727 m).   Weight as of this encounter: 226 lb (102.5 kg).  Last encounter: 10/20/2023. Last procedure: 11/02/2023.  Reason for  encounter:   Post-Procedure Evaluation   Type: Lumbar epidural steroid injection (LESI) (interlaminar)  Laterality: Right   Level:  L4-5 Level.  Imaging: Fluoroscopic guidance         Anesthesia: Local anesthesia (1-2% Lidocaine ) DOS: 10/21/2023  Performed by: Wallie Sherry, MD  Purpose: Diagnostic/Therapeutic Indications: Lumbar radicular pain of intraspinal etiology of more than 4 weeks that has failed to respond to conservative therapy and is severe enough to impact quality of life or function. 1. Lumbar radicular pain (RIGHT L4/5)    NAS-11 Pain score:   Pre-procedure: 7 /10   Post-procedure: 3 /10     Effectiveness:  Initial hour after procedure: 0 %  Subsequent 4-6 hours post-procedure: 0 %  Analgesia past initial 6 hours: 50% Ongoing improvement:  Analgesic:  50%   History of Present Illness   Evan Cherry is a 58 year old male who presents with ongoing back and leg pain following T10, T11, T12, L1 radiofrequency ablation and epidural injection.  He experiences persistent back and leg pain despite undergoing radiofrequency ablation (RFA) and an epidural injection.  This is improved from before.  The RFA provided approximately 60% relief for his back pain and about 50% relief for his leg pain.   The RFA was performed bilaterally at the levels of L1, T12, T11, and T10. The leg pain is no longer severe enough to disturb his sleep, although he remains aware of its presence.  He has been engaging in extensive physical activities at home, including cutting trees, hauling rocks, and building fences, which may have exacerbated his symptoms. Despite the pain, he has been able to complete these tasks.  He describes his lifestyle  as very active, having worked on his [place]-acre property for ten consecutive days, involving significant physical exertion such as shoveling gravel and spreading dirt. These activities have caused significant strain and stress on his back, sometimes resulting in  tears due to the pain.         ROS  Constitutional: Denies any fever or chills Gastrointestinal: No reported hemesis, hematochezia, vomiting, or acute GI distress Musculoskeletal: Denies any acute onset joint swelling, redness, loss of ROM, or weakness Neurological: No reported episodes of acute onset apraxia, aphasia, dysarthria, agnosia, amnesia, paralysis, loss of coordination, or loss of consciousness  Medication Review  Carboxymethylcellulose Sodium, EPINEPHrine, Tiotropium Bromide Monohydrate, acetaminophen , albuterol, azelastine, benazepril , benralizumab, cyanocobalamin, diclofenac Sodium, gabapentin , hydrochlorothiazide , loratadine, morphine, naproxen, naproxen sodium, pravastatin, testosterone  cypionate, and traMADol   History Review  Allergy: [name] has no known allergies. Drug: [name]  reports no history of drug use. Alcohol:  reports no history of alcohol use. Tobacco:  reports that he has quit smoking. His smokeless tobacco use includes snuff. Social: [name]  reports that he has quit smoking. His smokeless tobacco use includes snuff. He reports that he does not drink alcohol and does not use drugs. Medical:  has a past medical history of Asthma, Back pain, COPD (chronic obstructive pulmonary disease) (HCC), Decreased lung capacity, Hypertension, and Sleep apnea. Surgical: [name]  has a past surgical history that includes Colon surgery. Family: family history is not on file. He was adopted.  Laboratory Chemistry Profile   Renal Lab Results  Component Value Date   BUN 15 12/09/2014   CREATININE 0.82 12/09/2014   GFRAA >60 12/09/2014   GFRNONAA >60 12/09/2014    Hepatic Lab Results  Component Value Date   AST 25 12/08/2014   ALT 34 12/08/2014   ALBUMIN 4.4 12/08/2014   ALKPHOS 72 12/08/2014   LIPASE 171 12/26/2011    Electrolytes Lab Results  Component Value Date   NA 142 12/09/2014   K 4.2 12/09/2014   CL 107 12/09/2014   CALCIUM 9.2 12/09/2014     Bone No results found for: VD25OH, VD125OH2TOT, CI6874NY7, CI7874NY7, 25OHVITD1, 25OHVITD2, 25OHVITD3, TESTOFREE, TESTOSTERONE   Inflammation (CRP: Acute Phase) (ESR: Chronic Phase) No results found for: CRP, ESRSEDRATE, LATICACIDVEN       Note: Above Lab results reviewed.  Recent Imaging Review  DG PAIN CLINIC C-ARM 1-60 MIN NO REPORT Fluoro was used, but no Radiologist interpretation will be provided.  Please refer to NOTES tab for provider progress note. Note: Reviewed        Physical Exam  Vitals: BP (!) 140/69 (Cuff Size: Normal)   Pulse 84   Temp (!) 96.8 F (36 C) (Temporal)   Resp 16   Ht 5' 8 (1.727 m)   Wt 226 lb (102.5 kg)   SpO2 96%   BMI 34.36 kg/m  BMI: Estimated body mass index is 34.36 kg/m as calculated from the following:   Height as of this encounter: 5' 8 (1.727 m).   Weight as of this encounter: 226 lb (102.5 kg). Ideal: Ideal body weight: 68.4 kg (150 lb 12.7 oz) Adjusted ideal body weight: 82 kg (180 lb 14 oz) General appearance: Well nourished, well developed, and well hydrated. In no apparent acute distress Mental status: Alert, oriented x 3 (person, place, & time)       Respiratory: No evidence of acute respiratory distress Eyes: PERLA   Assessment   Diagnosis Status  1. Thoracic facet joint syndrome   2. Thoracic spondylosis without  myelopathy   3. Lumbar radicular pain (RIGHT L4/5)   4. Lumbar disc herniation   5. Chronic pain syndrome    Controlled Controlled Controlled   Updated Problems: No problems updated.  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 Pain is present but does not disrupt sleep. He is engaging in significant physical activity, indicating functional  improvement despite ongoing pain. Schedule a follow-up appointment for late October or early November. Consider further ablations or epidural injections before Thanksgiving or Christmas if necessary.       Mr. TREA LATNER has a current medication list which includes the following long-term medication(s): albuterol, albuterol, azelastine, benazepril , gabapentin , hydrochlorothiazide , loratadine, testosterone  cypionate, tiotropium bromide monohydrate, and pravastatin.  Pharmacotherapy (Medications Ordered): No orders of the defined types were placed in this encounter.  Orders:  No orders of the defined types were placed in this encounter.    B/L IA Knee steroid 12/12/19, 07/29/21: Helpful,  T10, 11, 12 MBB on 12/19/2019 followed by RFA (previosly done with Dr Mindi in 2018), status post bilateral T10, T11, T12 thoracic facet medial branch nerve block 8/23/ 2021 which provided 75% pain relief for 3-1/2 weeks with gradual return of pain thereafter.  Right T10, T11, T12 RFA on 01/30/2020; left T10, T11, T12 RFA on 02/20/2020, 12/05/20, 07/29/20, Left L4/5 ESI 05/05/22, right C7/T1 ESI and right SSNB 05/26/22, right l4/5 ESI 10/21/23; T10, T11, T12, L1 RFA 11/02/2023   Return in about 11 weeks (around 02/23/2024) for Follow up- discuss L-ESI vs Thoracic RFA.    Recent Visits Date Type Provider Dept  11/02/23 Procedure visit Marcelino Nurse, MD [facility]  10/21/23 Procedure visit Marcelino Nurse, MD [facility]  10/20/23 Office Visit Marcelino Nurse, MD [facility]  Showing recent visits within past 90 days and meeting all other requirements Today's Visits Date Type Provider Dept  12/08/23 Office Visit Marcelino Nurse, MD [facility]  Showing today's visits and meeting all other requirements Future Appointments Date Type Provider Dept  02/23/24 Appointment Marcelino Nurse, MD [facility]  Showing future appointments within next 90 days and meeting all other  requirements  I discussed the assessment and treatment plan with the patient. The patient was provided an opportunity to ask questions and all were answered. The patient agreed with the plan and demonstrated an understanding of the instructions.  Patient advised to call back or seek an in-person evaluation if the symptoms or condition worsens.  Duration of encounter: .  Total time on encounter, as per AMA guidelines included both the face-to-face and non-face-to-face time personally spent by the physician and/or other qualified health care professional(s) on the day of the encounter (includes time in activities that require the physician or other qualified health care professional and does not include time in activities normally performed by clinical staff). Physician's time may include the following activities when performed: Preparing to see the patient (e.g., pre-charting review of records, searching for previously ordered imaging, lab work, and nerve conduction tests) Review of prior analgesic pharmacotherapies. Reviewing PMP Interpreting ordered tests (e.g., lab work, imaging, nerve conduction tests) Performing post-procedure evaluations, including interpretation of diagnostic procedures Obtaining and/or reviewing separately obtained history Performing a medically appropriate examination and/or evaluation Counseling and educating the patient/family/caregiver Ordering medications,  tests, or procedures Referring and communicating with other health care professionals (when not separately reported) Documenting clinical information in the electronic or other health record Independently interpreting results (not separately reported) and communicating results to the patient/ family/caregiver Care coordination (not separately reported)  Note by: Wallie Sherry, MD (TTS and AI technology used. I apologize for any typographical errors that were not detected and corrected.) Date: 12/08/2023;  Time: 11:46 AM

## 2024-02-23 ENCOUNTER — Encounter: Payer: Self-pay | Admitting: Student in an Organized Health Care Education/Training Program

## 2024-02-23 ENCOUNTER — Ambulatory Visit
Attending: Student in an Organized Health Care Education/Training Program | Admitting: Student in an Organized Health Care Education/Training Program

## 2024-02-23 VITALS — BP 142/72 | HR 82 | Temp 98.2°F | Resp 16 | Ht 68.0 in | Wt 230.0 lb

## 2024-02-23 DIAGNOSIS — G8929 Other chronic pain: Secondary | ICD-10-CM | POA: Insufficient documentation

## 2024-02-23 DIAGNOSIS — M50222 Other cervical disc displacement at C5-C6 level: Secondary | ICD-10-CM | POA: Diagnosis not present

## 2024-02-23 DIAGNOSIS — M47894 Other spondylosis, thoracic region: Secondary | ICD-10-CM | POA: Insufficient documentation

## 2024-02-23 DIAGNOSIS — M47814 Spondylosis without myelopathy or radiculopathy, thoracic region: Secondary | ICD-10-CM | POA: Insufficient documentation

## 2024-02-23 DIAGNOSIS — M5412 Radiculopathy, cervical region: Secondary | ICD-10-CM | POA: Insufficient documentation

## 2024-02-23 DIAGNOSIS — M25511 Pain in right shoulder: Secondary | ICD-10-CM | POA: Insufficient documentation

## 2024-02-23 DIAGNOSIS — M5020 Other cervical disc displacement, unspecified cervical region: Secondary | ICD-10-CM | POA: Diagnosis present

## 2024-02-23 DIAGNOSIS — S46011S Strain of muscle(s) and tendon(s) of the rotator cuff of right shoulder, sequela: Secondary | ICD-10-CM | POA: Insufficient documentation

## 2024-02-23 NOTE — Patient Instructions (Signed)
 ______________________________________________________________________    Epidural Steroid Injection  An epidural steroid injection is given to relieve pain in your neck, back, or legs that is caused by the irritation or swelling of a nerve root. This procedure involves injecting a steroid and numbing medicine (anesthetic) into the epidural space. The epidural space is the space between the outer covering of your spinal cord and the bones that form your backbone (vertebra).  LET [facility] CARE PROVIDER KNOW ABOUT:  Any allergies you have. All medicines you are taking, including vitamins, herbs, eye drops, creams, and over-the-counter medicines such as aspirin. Previous problems you or members of your family have had with the use of anesthetics. Any blood disorders or blood clotting disorders you have. Previous surgeries you have had. Medical conditions you have.  RISKS AND COMPLICATIONS Generally, this is a safe procedure. However, as with any procedure, complications can occur. Possible complications of epidural steroid injection include: Headache. Bleeding. Infection. Allergic reaction to the medicines. Damage to your nerves. The response to this procedure depends on the underlying cause of the pain and its duration. People who have long-term (chronic) pain are less likely to benefit from epidural steroids than are those people whose pain comes on strong and suddenly.  BEFORE THE PROCEDURE  Ask your health care provider about changing or stopping your regular medicines. You may be advised to stop taking blood-thinning medicines a few days before the procedure. You may be given medicines to reduce anxiety. Arrange for someone to take you home after the procedure.  PROCEDURE  You will remain awake during the procedure. You may receive medicine to make you relaxed. You will be asked to lie on your stomach. The injection site will be cleaned. The injection site will be numbed with a  medicine (local anesthetic). A needle will be injected through your skin into the epidural space. Your health care provider will use an X-ray machine to ensure that the steroid is delivered closest to the affected nerve. You may have minimal discomfort at this time. Once the needle is in the right position, the local anesthetic and the steroid will be injected into the epidural space. The needle will then be removed and a bandage will be applied to the injection site.  AFTER THE PROCEDURE  You may be monitored for a short time before you go home. You may feel weakness or numbness in your arm or leg, which disappears within hours. You may be allowed to eat, drink, and take your regular medicine. You may have soreness at the site of the injection.   This information is not intended to replace advice given to you by your health care provider. Make sure you discuss any questions you have with your health care provider.   Document Released: 07/22/2007 Document Revised: 12/15/2012 Document Reviewed: 10/01/2012 Elsevier Interactive Patient Education 2016 [employer]  GENERAL RISKS AND COMPLICATIONS  What are the risk, side effects and possible complications? Generally speaking, most procedures are safe.  However, with any procedure there are risks, side effects, and the possibility of complications.  The risks and complications are dependent upon the sites that are lesioned, or the type of nerve block to be performed.  The closer the procedure is to the spine, the more serious the risks are.  Great care is taken when placing the radio frequency needles, block needles or lesioning probes, but sometimes complications can occur. Infection: Any time there is an injection through the skin, there is a risk of infection.  This  is why sterile conditions are used for these blocks. There are four possible types of infection: 1. Localized skin infection. 2. Central Nervous System Infection: This can be in  the form of Meningitis, which can be deadly. 3. Epidural Infections: This can be in the form of an epidural abscess, which can cause pressure inside of the spine, causing compression of the spinal cord with subsequent paralysis. This would require an emergency surgery to decompress, and there are no guarantees that the patient would recover from the paralysis. 4. Discitis: This is an infection of the intervertebral discs. It occurs in about 1% of discography procedures. It is difficult to treat and it may lead to surgery. Pain: the needles have to go through skin and soft tissues, will cause soreness. Damage to internal structures:  The nerves to be lesioned may be near blood vessels or other nerves which can be potentially damaged. Bleeding: Bleeding is more common if the patient is taking blood thinners such as  aspirin, Coumadin, Ticiid, Plavix, etc., or if he/she have some genetic predisposition such as hemophilia. Bleeding into the spinal canal can cause compression of the spinal  cord with subsequent paralysis.  This would require an emergency surgery to decompress and there are no guarantees that the patient would recover from the paralysis. Pneumothorax: Puncturing of a lung is a possibility, every time a needle is introduced in the area of the chest or upper back.  Pneumothorax refers to free air around the collapsed lung(s), inside of the thoracic cavity (chest cavity).  Another two possible complications related to a similar event would include: Hemothorax and Chylothorax. These are variations of the Pneumothorax, where instead of air around the collapsed lung(s), you may have blood or chyle, respectively. Spinal headaches: They may occur with any procedures in the area of the spine. Persistent CSF (Cerebro-Spinal Fluid) leakage: This is a rare problem, but may occur with prolonged intrathecal or epidural catheters either due to the formation of a fistulous track or a dural tear. Nerve damage: By  working so close to the spinal cord, there is always a possibility of nerve damage, which could be as serious as a permanent spinal cord injury with paralysis. Death: Although rare, severe deadly allergic reactions known as "Anaphylactic reaction" can occur to any of the medications used. Worsening of the symptoms: We can always make thing worse.  What are the chances of something like this happening? Chances of any of this occuring are extremely low.  By statistics, you have more of a chance of getting killed in a motor vehicle accident: while driving to the hospital than any of the above occurring .  Nevertheless, you should be aware that they are possibilities.  In general, it is similar to taking a shower.  Everybody knows that you can slip, hit your head and get killed.  Does that mean that you should not shower again?  Nevertheless always keep in mind that statistics do not mean anything if you happen to be on the wrong side of them.  Even if a procedure has a 1 (one) in a 1,000,000 (million) chance of going wrong, it you happen to be that one..Also, keep in mind that by statistics, you have more of a chance of having something go wrong when taking medications.  Who should not have this procedure? If you are on a blood thinning medication (e.g. Coumadin, Plavix, see list of "Blood Thinners"), or if you have an active infection going on, you should not have  the procedure.  If you are taking any blood thinners, please inform your physician.  Preparing for your procedure: Do not eat or drink anything at least eight (8) hours prior to the procedure. Bring a driver with you .  It cannot be a taxi. Come accompanied by an adult that can drive you back, and that is strong enough to help you if your legs get weak or numb from the local anesthetic. Take all of your medicines the morning of the procedure with just enough water to swallow them. If you have diabetes, make sure that you are scheduled to have  your procedure done first thing in the morning, whenever possible. If you have diabetes, take only half of your insulin dose and notify our nurse that you have done so as soon as you arrive at the clinic. If you are diabetic, but only take blood sugar pills (oral hypoglycemic), then do not take them on the morning of your procedure.  You may take them after you have had the procedure. Do not take aspirin or any aspirin-containing medications, at least eleven (11) days prior to the procedure.  They may prolong bleeding. Wear loose fitting clothing that may be easy to take off and that you would not mind if it got stained with Betadine or blood. Do not wear any jewelry or perfume Remove any nail coloring.  It will interfere with some of our monitoring equipment. If you take Metformin for your diabetes, stop it 48 hours prior to the procedure.  NOTE: Remember that this is not meant to be interpreted as a complete list of all possible complications.  Unforeseen problems may occur.  BLOOD THINNERS The following drugs contain aspirin or other products, which can cause increased bleeding during surgery and should not be taken for 2 weeks prior to and 1 week after surgery.  If you should need take something for relief of minor pain, you may take acetaminophen which is found in Tylenol,m Datril, Anacin-3 and Panadol. It is not blood thinner. The products listed below are.  Do not take any of the products listed below in addition to any listed on your instruction sheet.  A.P.C or A.P.C with Codeine Codeine Phosphate Capsules #3 Ibuprofen Ridaura  ABC compound Congesprin Imuran rimadil  Advil Cope Indocin Robaxisal  Alka-Seltzer Effervescent Pain Reliever and Antacid Coricidin or Coricidin-D  Indomethacin Rufen  Alka-Seltzer plus Cold Medicine Cosprin Ketoprofen S-A-C Tablets  Anacin Analgesic Tablets or Capsules Coumadin Korlgesic Salflex  Anacin Extra Strength Analgesic tablets or capsules CP-2 Tablets  Lanoril Salicylate  Anaprox Cuprimine Capsules Levenox Salocol  Anexsia-D Dalteparin Magan Salsalate  Anodynos Darvon compound Magnesium Salicylate Sine-off  Ansaid Dasin Capsules Magsal Sodium Salicylate  Anturane Depen Capsules Marnal Soma  APF Arthritis pain formula Dewitt's Pills Measurin Stanback  Argesic Dia-Gesic Meclofenamic Sulfinpyrazone  Arthritis Bayer Timed Release Aspirin Diclofenac Meclomen Sulindac  Arthritis pain formula Anacin Dicumarol Medipren Supac  Analgesic (Safety coated) Arthralgen Diffunasal Mefanamic Suprofen  Arthritis Strength Bufferin Dihydrocodeine Mepro Compound Suprol  Arthropan liquid Dopirydamole Methcarbomol with Aspirin Synalgos  ASA tablets/Enseals Disalcid Micrainin Tagament  Ascriptin Doan's Midol Talwin  Ascriptin A/D Dolene Mobidin Tanderil  Ascriptin Extra Strength Dolobid Moblgesic Ticlid  Ascriptin with Codeine Doloprin or Doloprin with Codeine Momentum Tolectin  Asperbuf Duoprin Mono-gesic Trendar  Aspergum Duradyne Motrin or Motrin IB Triminicin  Aspirin plain, buffered or enteric coated Durasal Myochrisine Trigesic  Aspirin Suppositories Easprin Nalfon Trillsate  Aspirin with Codeine Ecotrin Regular or Extra Strength Naprosyn Uracel  Atromid-S Efficin Naproxen Ursinus  Auranofin Capsules Elmiron Neocylate Vanquish  Axotal Emagrin Norgesic Verin  Azathioprine Empirin or Empirin with Codeine Normiflo Vitamin E  Azolid Emprazil Nuprin Voltaren  Bayer Aspirin plain, buffered or children's or timed BC Tablets or powders Encaprin Orgaran Warfarin Sodium  Buff-a-Comp Enoxaparin Orudis Zorpin  Buff-a-Comp with Codeine Equegesic Os-Cal-Gesic   Buffaprin Excedrin plain, buffered or Extra Strength Oxalid   Bufferin Arthritis Strength Feldene Oxphenbutazone   Bufferin plain or Extra Strength Feldene Capsules Oxycodone with Aspirin   Bufferin with Codeine Fenoprofen Fenoprofen Pabalate or Pabalate-SF   Buffets II Flogesic Panagesic   Buffinol  plain or Extra Strength Florinal or Florinal with Codeine Panwarfarin   Buf-Tabs Flurbiprofen Penicillamine   Butalbital Compound Four-way cold tablets Penicillin   Butazolidin Fragmin Pepto-Bismol   Carbenicillin Geminisyn Percodan   Carna Arthritis Reliever Geopen Persantine   Carprofen Gold's salt Persistin   Chloramphenicol Goody's Phenylbutazone   Chloromycetin Haltrain Piroxlcam   Clmetidine heparin Plaquenil   Cllnoril Hyco-pap Ponstel   Clofibrate Hydroxy chloroquine Propoxyphen         Before stopping any of these medications, be sure to consult the physician who ordered them.  Some, such as Coumadin (Warfarin) are ordered to prevent or treat serious conditions such as "deep thrombosis", "pumonary embolisms", and other heart problems.  The amount of time that you may need off of the medication may also vary with the medication and the reason for which you were taking it.  If you are taking any of these medications, please make sure you notify your pain physician before you undergo any procedures. ______________________________________________________________________   ______________________________________________________________________    Preparing for your procedure  Appointments: If you think you may not be able to keep your appointment, call [phone] hours in advance to cancel. We need time to make it available to others.  Procedure visits are for procedures only. During your procedure appointment there will be: NO Prescription Refills*. NO medication changes or discussions*. NO discussion of disability issues*. NO unrelated pain problem evaluations*. NO evaluations to order other pain procedures*. *These will be addressed at a separate and distinct evaluation encounter on the provider's evaluation schedule and not during procedure days.  Instructions: Food intake: Avoid eating anything solid for at least 8 hours prior to your procedure. Clear liquid intake: You may take  clear liquids such as water up to 2 hours prior to your procedure. (No carbonated drinks. No soda.) Transportation: Unless otherwise stated by your physician, bring a driver. (Driver cannot be a Market researcher, Pharmacist, community, or any other form of public transportation.) Morning Medicines: Except for blood thinners, take all of your other morning medications with a sip of water. Make sure to take your heart and blood pressure medicines. If your blood pressure's lower number is above 100, the case will be rescheduled. Blood thinners: Make sure to stop your blood thinners as instructed.  If you take a blood thinner, but were not instructed to stop it, call our office [phone] and ask to talk to a nurse. Not stopping a blood thinner prior to certain procedures could lead to serious complications. Diabetics on insulin: Notify the staff so that you can be scheduled 1st case in the morning. If your diabetes requires high dose insulin, take only  of your normal insulin dose the morning of the procedure and notify the staff that you have done so. Preventing infections: Shower with an antibacterial soap the morning of your procedure.  Build-up your immune system: Take 1000  mg of Vitamin C with every meal (3 times a day) the day prior to your procedure. Antibiotics: Inform the nursing staff if you are taking any antibiotics or if you have any conditions that may require antibiotics prior to procedures. (Example: recent joint implants)   Pregnancy: If you are pregnant make sure to notify the nursing staff. Not doing so may result in injury to the fetus, including death.  Sickness: If you have a cold, fever, or any active infections, call and cancel or reschedule your procedure. Receiving steroids while having an infection may result in complications. Arrival: You must be in the facility at least 30 minutes prior to your scheduled procedure. Tardiness: Your scheduled time is also the cutoff time. If you do not arrive at least 15  minutes prior to your procedure, you will be rescheduled.  Children: Do not bring any children with you. Make arrangements to keep them home. Dress appropriately: There is always a possibility that your clothing may get soiled. Avoid long dresses. Valuables: Do not bring any jewelry or valuables.  Reasons to call and reschedule or cancel your procedure: (Following these recommendations will minimize the risk of a serious complication.) Surgeries: Avoid having procedures within 2 weeks of any surgery. (Avoid for 2 weeks before or after any surgery). Flu Shots: Avoid having procedures within 2 weeks of a flu shots or . (Avoid for 2 weeks before or after immunizations). Barium: Avoid having a procedure within 7-10 days after having had a radiological study involving the use of radiological contrast. (Myelograms, Barium swallow or enema study). Heart attacks: Avoid any elective procedures or surgeries for the initial 6 months after a "Myocardial Infarction" (Heart Attack). Blood thinners: It is imperative that you stop these medications before procedures. Let us know if you if you take any blood thinner.  Infection: Avoid procedures during or within two weeks of an infection (including chest colds or gastrointestinal problems). Symptoms associated with infections include: Localized redness, fever, chills, night sweats or profuse sweating, burning sensation when voiding, cough, congestion, stuffiness, runny nose, sore throat, diarrhea, nausea, vomiting, cold or Flu symptoms, recent or current infections. It is specially important if the infection is over the area that we intend to treat. Heart and lung problems: Symptoms that may suggest an active cardiopulmonary problem include: cough, chest pain, breathing difficulties or shortness of breath, dizziness, ankle swelling, uncontrolled high or unusually low blood pressure, and/or palpitations. If you are experiencing any of these symptoms, cancel your procedure  and contact your primary care physician for an evaluation.  Remember:  Regular Business hours are:  Monday to Thursday 8:00 AM to 4:00 PM  Provider's Schedule: Delano Metz, MD:  Procedure days: Tuesday and Thursday 7:30 AM to 4:00 PM  Edward Jolly, MD:  Procedure days: Monday and Wednesday 7:30 AM to 4:00 PM Last  Updated: 04/07/2023 ______________________________________________________________________

## 2024-02-23 NOTE — Progress Notes (Signed)
 PROVIDER NOTE: Interpretation of information contained herein should be left to medically-trained personnel. Specific patient instructions are provided elsewhere under Patient Instructions section of medical record. This document was created in part using AI and STT-dictation technology, any transcriptional errors that may result from this process are unintentional.  Patient: Evan Cherry  Service: E/M   PCP: Iven Harlene CROME, MD  DOB: January 02, 1966  DOS: 02/23/2024  Provider: Wallie Sherry, MD  MRN: [id number]  Delivery: Face-to-face  Specialty: Interventional Pain Management  Type: Established Patient  Setting: Ambulatory outpatient facility  Specialty designation: 09  Referring Prov.: Iven Harlene CROME, MD  Location: Outpatient office facility       History of present illness (HPI) Evan Cherry, a 58 y.o. year old male, is here today because of his Thoracic facet joint syndrome [M47.894]. Evan Cherry primary complain today is Back Pain (middle) and Neck Pain  Pertinent problems: Evan Cherry has Bilateral post-traumatic osteoarthritis of knee; Chronic pain of both knees; Thoracic facet joint syndrome; Thoracic spondylosis without myelopathy; and Lumbar facet arthropathy on their pertinent problem list.  Pain Assessment: Severity of Chronic pain is reported as a 5 /10. Location: Neck  /back pain radiates down left leg. Onset: More than a month ago. Quality: Sharp, Throbbing. Timing: Constant. Modifying factor(s): medications, ice. Vitals:  height is 5' 8 (1.727 m) and weight is 230 lb (104.3 kg). His temporal temperature is 98.2 F (36.8 C). His blood pressure is 142/72 (abnormal) and his pulse is 82. His respiration is 16 and oxygen saturation is 98%.  BMI: Estimated body mass index is 34.97 kg/m as calculated from the following:   Height as of this encounter: 5' 8 (1.727 m).   Weight as of this encounter: 230 lb (104.3 kg).  Last encounter: 12/08/2023. Last procedure: 11/02/2023.  Reason for  encounter:   Discussed the use of AI scribe software for clinical note transcription with the patient, who gave verbal consent to proceed.  History of Present Illness   Evan Cherry is a 58 year old male who presents for pain management follow-up.  He is doing well after his thoracic RFA that was done 11/02/2023.  He endorses a significant reduction in his low back and radiating leg pain since a thoracic RFA.  He is complaining of neck pain and right shoulder pain.  He has a right supraspinatus tear with significant rotator cuff arthropathy.  He previously had a right suprascapular nerve block done in 2024 which was helpful.  He  states that the pain occasionally radiates down his left arm.  He experiences severe and persistent pain in his right shoulder and reports having 'three torn tendons.'  He also has chronic back pain, which was previously managed with an epidural in June.          ROS  Constitutional: Denies any fever or chills Gastrointestinal: No reported hemesis, hematochezia, vomiting, or acute GI distress Musculoskeletal: Cervicalgia and right shoulder pain Neurological: No reported episodes of acute onset apraxia, aphasia, dysarthria, agnosia, amnesia, paralysis, loss of coordination, or loss of consciousness  Medication Review  Carboxymethylcellulose Sodium, EPINEPHrine, Tiotropium Bromide Monohydrate, acetaminophen , albuterol, azelastine, benazepril , benralizumab, cyanocobalamin, diclofenac Sodium, gabapentin , hydrochlorothiazide , loratadine, morphine, naproxen, naproxen sodium, pravastatin, testosterone  cypionate, and traMADol   History Review  Allergy: Evan Cherry has no known allergies. Drug: Evan Cherry  reports no history of drug use. Alcohol:  reports no history of alcohol use. Tobacco:  reports that he has quit smoking. His smokeless tobacco use includes snuff.  Social: Evan Cherry  reports that he has quit smoking. His smokeless tobacco use includes snuff. He reports that  he does not drink alcohol and does not use drugs. Medical:  has a past medical history of Asthma, Back pain, COPD (chronic obstructive pulmonary disease) (HCC), Decreased lung capacity, Hypertension, and Sleep apnea. Surgical: Evan Cherry  has a past surgical history that includes Colon surgery. Family: family history is not on file. He was adopted.  Laboratory Chemistry Profile   Renal Lab Results  Component Value Date   BUN 15 12/09/2014   CREATININE 0.82 12/09/2014   GFRAA >60 12/09/2014   GFRNONAA >60 12/09/2014    Hepatic Lab Results  Component Value Date   AST 25 12/08/2014   ALT 34 12/08/2014   ALBUMIN 4.4 12/08/2014   ALKPHOS 72 12/08/2014   LIPASE 171 12/26/2011    Electrolytes Lab Results  Component Value Date   NA 142 12/09/2014   K 4.2 12/09/2014   CL 107 12/09/2014   CALCIUM 9.2 12/09/2014    Bone No results found for: VD25OH, VD125OH2TOT, CI6874NY7, CI7874NY7, 25OHVITD1, 25OHVITD2, 25OHVITD3, TESTOFREE, TESTOSTERONE   Inflammation (CRP: Acute Phase) (ESR: Chronic Phase) No results found for: CRP, ESRSEDRATE, LATICACIDVEN       Note: Above Lab results reviewed.  Recent Imaging Review  DG PAIN CLINIC C-ARM 1-60 MIN NO REPORT Fluoro was used, but no Radiologist interpretation will be provided.  Please refer to NOTES tab for provider progress note. Note: Reviewed        Physical Exam  Vitals: BP (!) 142/72 (Cuff Size: Large)   Pulse 82   Temp 98.2 F (36.8 C) (Temporal)   Resp 16   Ht 5' 8 (1.727 m)   Wt 230 lb (104.3 kg)   SpO2 98%   BMI 34.97 kg/m  BMI: Estimated body mass index is 34.97 kg/m as calculated from the following:   Height as of this encounter: 5' 8 (1.727 m).   Weight as of this encounter: 230 lb (104.3 kg). Ideal: Ideal body weight: 68.4 kg (150 lb 12.7 oz) Adjusted ideal body weight: 82.8 kg (182 lb 7.6 oz) General appearance: Well nourished, well developed, and well hydrated. In no apparent acute  distress Mental status: Alert, oriented x 3 (person, place, & time)       Respiratory: No evidence of acute respiratory distress Eyes: PERLA   Assessment   Diagnosis Status  1. Thoracic facet joint syndrome   2. 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 Cervical radicular pain   4. Thoracic spondylosis without myelopathy   5. Chronic right shoulder pain   6. Rupture of right supraspinatus tendon, sequela    Controlled Controlled Controlled   Updated Problems: No problems updated.  Plan of Care  Patient is doing well after his thoracic RFA.  Notes improvement in his mid back pain and radiating leg pain as well.  He is complaining of cervicalgia and is status post a cervical epidural steroid injection in January 2024 which she found helpful.  He would like to repeat that.  He is also having increased right shoulder pain.  He has right supraspinatus tear and rotator cuff arthropathy.  Recommend repeating a right suprascapular nerve block. Orders:  Orders Placed This Encounter  Procedures   Cervical Epidural Injection    Sedation: Patient's choice. Purpose: Diagnostic/Therapeutic Indication(s): Radiculitis and cervicalgia associater with cervical degenerative disc disease.    Standing Status:   Future    Expected Date:   03/09/2024  Expiration Date:   02/22/2025    Scheduling Instructions:     Procedure: Cervical Epidural Steroid Injection/Block     Level(s): C7-T1     Laterality: TBD     Timeframe: As soon as schedule allows.    Where will this procedure be performed?:   [facility]             Ambrie Carte   SUPRASCAPULAR NERVE BLOCK    For shoulder pain.    Standing Status:   Future    Expected Date:   03/09/2024    Expiration Date:   02/22/2025    Scheduling Instructions:     Purpose: Diagnostic     Laterality: RIGHT     Level(s): Suprascapular notch     Sedation: Patient's choice.     Scheduling Timeframe: As permitted by the schedule    Where will this procedure  be performed?:   [facility]     B/L IA Knee steroid 12/12/19, 07/29/21: Helpful,  T10, 11, 12 MBB on 12/19/2019 followed by RFA (previosly done with Dr Mindi in 2018), status post bilateral T10, T11, T12 thoracic facet medial branch nerve block 8/23/ 2021 which provided 75% pain relief for 3-1/2 weeks with gradual return of pain thereafter.  Right T10, T11, T12 RFA on 01/30/2020; left T10, T11, T12 RFA on 02/20/2020, 12/05/20, 07/29/20, Left L4/5 ESI 05/05/22, right C7/T1 ESI and right SSNB 05/26/22, right l4/5 ESI 10/21/23; T10, T11, T12, L1 RFA 11/02/2023   Return in about 15 days (around 03/09/2024) for C-ESI + Right SSNB, in clinic [facility].    Recent Visits Date Type Provider Dept  12/08/23 Office Visit Marcelino Nurse, MD [facility]  Showing recent visits within past 90 days and meeting all other requirements Today's Visits Date Type Provider Dept  02/23/24 Office Visit Marcelino Nurse, MD [facility]  Showing today's visits and meeting all other requirements Future Appointments No visits were found meeting these conditions. Showing future appointments within next 90 days and meeting all other requirements  I personally spent a total of 30 minutes in the care of the patient today including preparing to see the patient, getting/reviewing separately obtained history, performing a medically appropriate exam/evaluation, counseling and educating, placing orders, and documenting clinical information in the EHR.   Note by: Nurse Marcelino, MD (TTS and AI technology used. I apologize for any typographical errors that were not detected and corrected.) Date: 02/23/2024; Time: 1:46 PM

## 2024-03-09 ENCOUNTER — Ambulatory Visit
Admission: RE | Admit: 2024-03-09 | Discharge: 2024-03-09 | Disposition: A | Discharge status: Home / Self Care | Source: Ambulatory Visit | Attending: Student in an Organized Health Care Education/Training Program | Admitting: Student in an Organized Health Care Education/Training Program

## 2024-03-09 ENCOUNTER — Other Ambulatory Visit: Payer: Self-pay | Admitting: Student in an Organized Health Care Education/Training Program

## 2024-03-09 ENCOUNTER — Ambulatory Visit: Admitting: Student in an Organized Health Care Education/Training Program

## 2024-03-09 ENCOUNTER — Encounter: Payer: Self-pay | Admitting: Student in an Organized Health Care Education/Training Program

## 2024-03-09 VITALS — BP 145/86 | HR 78 | Temp 99.8°F | Resp 17 | Ht 68.0 in | Wt 230.0 lb

## 2024-03-09 DIAGNOSIS — G894 Chronic pain syndrome: Secondary | ICD-10-CM | POA: Insufficient documentation

## 2024-03-09 DIAGNOSIS — S46011S Strain of muscle(s) and tendon(s) of the rotator cuff of right shoulder, sequela: Secondary | ICD-10-CM | POA: Diagnosis present

## 2024-03-09 DIAGNOSIS — M25511 Pain in right shoulder: Secondary | ICD-10-CM

## 2024-03-09 DIAGNOSIS — M5412 Radiculopathy, cervical region: Secondary | ICD-10-CM | POA: Insufficient documentation

## 2024-03-09 DIAGNOSIS — M5020 Other cervical disc displacement, unspecified cervical region: Secondary | ICD-10-CM | POA: Diagnosis present

## 2024-03-09 DIAGNOSIS — G8929 Other chronic pain: Secondary | ICD-10-CM

## 2024-03-09 DIAGNOSIS — M50222 Other cervical disc displacement at C5-C6 level: Secondary | ICD-10-CM | POA: Diagnosis not present

## 2024-03-09 MED ORDER — ROPIVACAINE HCL 2 MG/ML IJ SOLN
1.0000 mL | Freq: Once | INTRAMUSCULAR | Status: AC
  Administered 2024-03-09: 1 mL via EPIDURAL

## 2024-03-09 MED ORDER — SODIUM CHLORIDE 0.9% FLUSH
1.0000 mL | Freq: Once | INTRAVENOUS | Status: AC
  Administered 2024-03-09: 1 mL

## 2024-03-09 MED ORDER — IOHEXOL 180 MG/ML  SOLN
10.0000 mL | Freq: Once | INTRAMUSCULAR | Status: AC
  Administered 2024-03-09: 10 mL via EPIDURAL

## 2024-03-09 MED ORDER — SODIUM CHLORIDE (PF) 0.9 % IJ SOLN
INTRAMUSCULAR | Status: AC
Start: 2024-03-09 — End: 2024-03-09
  Filled 2024-03-09: qty 10

## 2024-03-09 MED ORDER — DEXAMETHASONE SOD PHOSPHATE PF 10 MG/ML IJ SOLN
10.0000 mg | Freq: Once | INTRAMUSCULAR | Status: AC
  Administered 2024-03-09: 10 mg

## 2024-03-09 MED ORDER — IOHEXOL 180 MG/ML  SOLN
INTRAMUSCULAR | Status: AC
  Filled 2024-03-09: qty 10

## 2024-03-09 MED ORDER — LIDOCAINE HCL (PF) 2 % IJ SOLN
INTRAMUSCULAR | Status: AC
  Filled 2024-03-09: qty 10

## 2024-03-09 MED ORDER — LIDOCAINE HCL 2 % IJ SOLN
20.0000 mL | Freq: Once | INTRAMUSCULAR | Status: AC
  Administered 2024-03-09: 200 mg

## 2024-03-09 MED ORDER — ROPIVACAINE HCL 2 MG/ML IJ SOLN
INTRAMUSCULAR | Status: AC
  Filled 2024-03-09: qty 20

## 2024-03-09 NOTE — Progress Notes (Signed)
 Safety precautions to be maintained throughout the outpatient stay will include: orient to surroundings, keep bed in low position, maintain call bell within reach at all times, provide assistance with transfer out of bed and ambulation.

## 2024-03-09 NOTE — Patient Instructions (Signed)

## 2024-03-09 NOTE — Progress Notes (Signed)
 PROVIDER NOTE: Interpretation of information contained herein should be left to medically-trained personnel. Specific patient instructions are provided elsewhere under Patient Instructions section of medical record. This document was created in part using STT-dictation technology, any transcriptional errors that may result from this process are unintentional.  Patient: Evan Cherry Type: Established DOB: [birth year] MRN: [id number] PCP: Iven Harlene CROME, MD  Service: Procedure DOS: 03/09/2024 Setting: Ambulatory Location: Ambulatory outpatient facility Delivery: Face-to-face Provider: Wallie Sherry, MD Specialty: Interventional Pain Management Specialty designation: 09 Location: Outpatient facility Ref. Prov.: Iven Harlene CROME, MD       Interventional Therapy   Type: Cervical Epidural Steroid injection (CESI) (Interlaminar)  Laterality: Midline  Level: C7-T1 DOS: 03/09/2024  Provider: Wallie Sherry, MD Imaging: Fluoroscopy-guided Spinal ([id number]) Anesthesia: Local anesthesia (1-2% Lidocaine )   Medical Necessity Purpose: Diagnostic/Therapeutic Rationale (medical necessity): procedure needed and proper for the diagnosis and/or treatment of Evan Cherry medical symptoms and needs. Indications: Cervicalgia, cervical radicular pain, degenerative disc disease, severe enough to impact quality of life or function. 1. 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 Cervical radicular pain   3. Chronic right shoulder pain   4. Rupture of right supraspinatus tendon, sequela    NAS-11 Pain score:   Pre-procedure: 5 /10   Post-procedure: 6 /10     Position  Prep  Materials:  Location setting: Procedure suite Position: Prone, on modified reverse trendelenburg to facilitate breathing, with head in head-cradle. Pillows positioned under chest (below chin-level) with cervical spine flexed. Safety Precautions: Patient was assessed for positional comfort and pressure points before starting the  procedure. Prepping solution: DuraPrep (Iodine Povacrylex [0.7% available iodine] and Isopropyl Alcohol, 74% w/w) Prep Area: Entire  cervicothoracic region Approach: percutaneous, paramedial Intended target: Posterior cervical epidural space Materials Procedure:  Tray: Epidural Needle(s): Epidural (Tuohy) Qty: 1 Length: (90mm) 3.5-inch Gauge: 22G  H&P (Pre-op Assessment):  Evan Cherry is a 58 y.o. (year old), male patient, seen today for interventional treatment. He  has a past surgical history that includes Colon surgery. Evan Cherry has a current medication list which includes the following prescription(s): acetaminophen , albuterol, albuterol, azelastine, benazepril , fasenra, carboxymethylcellulose sodium, cyanocobalamin, diclofenac sodium, epinephrine, gabapentin , hydrochlorothiazide , loratadine, morphine, naproxen, naproxen sodium, pravastatin, testosterone  cypionate, tiotropium bromide monohydrate, and tramadol . His primarily concern today is the Neck Pain (Right )  Initial Vital Signs:  Pulse/HCG Rate: 78ECG Heart Rate: 77 Temp: 99.8 F (37.7 C) Resp: 16 BP: (!) 151/79 SpO2: 97 %  BMI: Estimated body mass index is 34.97 kg/m as calculated from the following:   Height as of this encounter: 5' 8 (1.727 m).   Weight as of this encounter: 230 lb (104.3 kg).  Risk Assessment: Allergies: Reviewed. He has no known allergies.  Allergy Precautions: None required Coagulopathies: Reviewed. None identified.  Blood-thinner therapy: None at this time Active Infection(s): Reviewed. None identified. Evan Cherry is afebrile  Site Confirmation: Evan Cherry was asked to confirm the procedure and laterality before marking the site Procedure checklist: Completed Consent: Before the procedure and under the influence of no sedative(s), amnesic(s), or anxiolytics, the patient was informed of the treatment options, risks and possible complications. To fulfill our ethical and legal obligations, as recommended  by the American Medical Association's Code of Ethics, I have informed the patient of my clinical impression; the nature and purpose of the treatment or procedure; the risks, benefits, and possible complications of the intervention; the alternatives, including doing nothing; the risk(s) and benefit(s) of the alternative treatment(s) or procedure(s); and the risk(s) and  benefit(s) of doing nothing. The patient was provided information about the general risks and possible complications associated with the procedure. These may include, but are not limited to: failure to achieve desired goals, infection, bleeding, organ or nerve damage, allergic reactions, paralysis, and death. In addition, the patient was informed of those risks and complications associated to Spine-related procedures, such as failure to decrease pain; infection (i.e.: Meningitis, epidural or intraspinal abscess); bleeding (i.e.: epidural hematoma, subarachnoid hemorrhage, or any other type of intraspinal or peri-dural bleeding); organ or nerve damage (i.e.: Any type of peripheral nerve, nerve root, or spinal cord injury) with subsequent damage to sensory, motor, and/or autonomic systems, resulting in permanent pain, numbness, and/or weakness of one or several areas of the body; allergic reactions; (i.e.: anaphylactic reaction); and/or death. Furthermore, the patient was informed of those risks and complications associated with the medications. These include, but are not limited to: allergic reactions (i.e.: anaphylactic or anaphylactoid reaction(s)); adrenal axis suppression; blood sugar elevation that in diabetics may result in ketoacidosis or comma; water retention that in patients with history of congestive heart failure may result in shortness of breath, pulmonary edema, and decompensation with resultant heart failure; weight gain; swelling or edema; medication-induced neural toxicity; particulate matter embolism and blood vessel occlusion with  resultant organ, and/or nervous system infarction; and/or aseptic necrosis of one or more joints. Finally, the patient was informed that Medicine is not an exact science; therefore, there is also the possibility of unforeseen or unpredictable risks and/or possible complications that may result in a catastrophic outcome. The patient indicated having understood very clearly. We have given the patient no guarantees and we have made no promises. Enough time was given to the patient to ask questions, all of which were answered to the patient's satisfaction. Mr. Glassburn has indicated that he wanted to continue with the procedure. Attestation: I, the ordering provider, attest that I have discussed with the patient the benefits, risks, side-effects, alternatives, likelihood of achieving goals, and potential problems during recovery for the procedure that I have provided informed consent. Date  Time: 03/09/2024 11:13 AM  Pre-Procedure Preparation:  Monitoring: As per clinic protocol. Respiration, ETCO2, SpO2, BP, heart rate and rhythm monitor placed and checked for adequate function Safety Precautions: Patient was assessed for positional comfort and pressure points before starting the procedure. Time-out: I initiated and conducted the Time-out before starting the procedure, as per protocol. The patient was asked to participate by confirming the accuracy of the Time Out information. Verification of the correct person, site, and procedure were performed and confirmed by me, the nursing staff, and the patient. Time-out conducted as per Joint Commission's Universal Protocol (UP.01.01.01). Time: 1152 Start Time: 1152 hrs.  Description  Narrative of Procedure:          Rationale (medical necessity): procedure needed and proper for the diagnosis and/or treatment of the patient's medical symptoms and needs. Start Time: 1152 hrs. Safety Precautions: Aspiration looking for blood return was conducted prior to all  injections. At no point did we inject any substances, as a needle was being advanced. No attempts were made at seeking any paresthesias. Safe injection practices and needle disposal techniques used. Medications properly checked for expiration dates. SDV (single dose vial) medications used. Description of procedure: Protocol guidelines were followed. The patient was assisted into a comfortable position. The target area was identified and the area prepped in the usual manner. Skin & deeper tissues infiltrated with local anesthetic. Appropriate amount of time allowed to pass for  local anesthetics to take effect. Using fluoroscopic guidance, the epidural needle was introduced through the skin, ipsilateral to the reported pain, and advanced to the target area. Posterior laminar os was contacted and the needle walked caudad, until the lamina was cleared. The ligamentum flavum was engaged and the epidural space identified using "loss-of-resistance technique" with 2-3 ml of PF-NaCl (0.9% NSS), in a 5cc dedicated LOR syringe. (See Imaging guidance below for use of contrast details.) Once proper needle placement was secured, and negative aspiration confirmed, the solution was injected in intermittent fashion, asking for systemic symptoms every 0.5cc. The needles were then removed and the area cleansed, making sure to leave some of the prepping solution back to take advantage of its long term bactericidal properties.  3 cc solution made of 1 cc of preservative-free saline, 1 cc of 0.2% ropivacaine , 1 cc of Decadron  10 mg/cc.   Vitals:   03/09/24 1121 03/09/24 1153 03/09/24 1201 03/09/24 1204  BP: (!) 151/79 (!) 159/97 (!) 164/107 (!) 145/86  Pulse: 78     Resp: 16 15 17    Temp: 99.8 F (37.7 C)     TempSrc: Temporal     SpO2: 97% 96% 96%   Weight: 230 lb (104.3 kg)     Height: 5' 8 (1.727 m)        End Time: 1200 hrs.  Imaging Guidance (Spinal):          Type of Imaging Technique: Fluoroscopy Guidance  (Spinal) Indication(s): Fluoroscopy guidance for needle placement to enhance accuracy in procedures requiring precise needle localization for targeted delivery of medication in or near specific anatomical locations not easily accessible without such real-time imaging assistance. Exposure Time: Please see nurses notes. Contrast: Before injecting any contrast, we confirmed that the patient did not have an allergy to iodine, shellfish, or radiological contrast. Once satisfactory needle placement was completed at the desired level, radiological contrast was injected. Contrast injected under live fluoroscopy. No contrast complications. See chart for type and volume of contrast used. Fluoroscopic Guidance: I was personally present during the use of fluoroscopy. Tunnel Vision Technique used to obtain the best possible view of the target area. Parallax error corrected before commencing the procedure. Direction-depth-direction technique used to introduce the needle under continuous pulsed fluoroscopy. Once target was reached, antero-posterior, oblique, and lateral fluoroscopic projection used confirm needle placement in all planes. Images permanently stored in EMR. Interpretation: I personally interpreted the imaging intraoperatively. Adequate needle placement confirmed in multiple planes. Appropriate spread of contrast into desired area was observed. No evidence of afferent or efferent intravascular uptake. No intrathecal or subarachnoid spread observed. Permanent images saved into the patient's record.  Post-operative Assessment:  Post-procedure Vital Signs:  Pulse/HCG Rate: 7886 Temp: 99.8 F (37.7 C) Resp: 17 BP: (!) 145/86 SpO2: 96 %  EBL: None  Complications: No immediate post-treatment complications observed by team, or reported by patient.  Note: The patient tolerated the entire procedure well. A repeat set of vitals were taken after the procedure and the patient was kept under observation  following institutional policy, for this type of procedure. Post-procedural neurological assessment was performed, showing return to baseline, prior to discharge. The patient was provided with post-procedure discharge instructions, including a section on how to identify potential problems. Should any problems arise concerning this procedure, the patient was given instructions to immediately contact us , at any time, without hesitation. In any case, we plan to contact the patient by telephone for a follow-up status report regarding this interventional procedure.  Comments:  No additional relevant information.  Plan of Care (POC)  Orders:  No orders of the defined types were placed in this encounter.    Medications ordered for procedure: Meds ordered this encounter  Medications   iohexol  (OMNIPAQUE ) 180 MG/ML injection 10 mL    Must be Myelogram-compatible. If not available, you may substitute with a water-soluble, non-ionic, hypoallergenic, myelogram-compatible radiological contrast medium.   lidocaine  (XYLOCAINE ) 2 % (with pres) injection 400 mg   ropivacaine  (PF) 2 mg/mL (0.2%) (NAROPIN ) injection 1 mL   sodium chloride  flush (NS) 0.9 % injection 1 mL   dexamethasone  (DECADRON ) injection 10 mg   dexamethasone  (DECADRON ) injection 10 mg   Medications administered: We administered iohexol , lidocaine , ropivacaine  (PF) 2 mg/mL (0.2%), sodium chloride  flush, dexamethasone , and dexamethasone .  See the medical record for exact dosing, route, and time of administration.    Follow-up plan:   Return in about 8 weeks (around 05/04/2024), or F2F PPE Dr Marcelino.     Recent Visits Date Type Provider Dept  02/23/24 Office Visit Marcelino Nurse, MD [facility]  Showing recent visits within past 90 days and meeting all other requirements Today's Visits Date Type Provider Dept  03/09/24 Procedure visit Marcelino Nurse, MD [facility]  Showing today's visits and meeting all other  requirements Future Appointments No visits were found meeting these conditions. Showing future appointments within next 90 days and meeting all other requirements   Disposition: Discharge home  Discharge (Date  Time): 03/09/2024; 1205 hrs.   Primary Care Physician: Iven Harlene CROME, MD Location: [facility] Note by: Nurse Marcelino, MD (TTS technology used. I apologize for any typographical errors that were not detected and corrected.) Date: 03/09/2024; Time: 12:44 PM  Disclaimer:  Medicine is not an visual merchandiser. The only guarantee in medicine is that nothing is guaranteed. It is important to note that the decision to proceed with this intervention was based on the information collected from the patient. The Data and conclusions were drawn from the patient's questionnaire, the interview, and the physical examination. Because the information was provided in large part by the patient, it cannot be guaranteed that it has not been purposely or unconsciously manipulated. Every effort has been made to obtain as much relevant data as possible for this evaluation. It is important to note that the conclusions that lead to this procedure are derived in large part from the available data. Always take into account that the treatment will also be dependent on availability of resources and existing treatment guidelines, considered by other Pain Management Practitioners as being common knowledge and practice, at the time of the intervention. For Medico-Legal purposes, it is also important to point out that variation in procedural techniques and pharmacological choices are the acceptable norm. The indications, contraindications, technique, and results of the above procedure should only be interpreted and judged by a Board-Certified Interventional Pain Specialist with extensive familiarity and expertise in the same exact procedure and technique.

## 2024-03-09 NOTE — Progress Notes (Signed)
 PROVIDER NOTE: Interpretation of information contained herein should be left to medically-trained personnel. Specific patient instructions are provided elsewhere under Patient Instructions section of medical record. This document was created in part using STT-dictation technology, any transcriptional errors that may result from this process are unintentional.  Patient: Evan Cherry Type: Established DOB: [birth year] MRN: [id number] PCP: Iven Harlene CROME, MD  Service: Procedure DOS: 03/09/2024 Setting: Ambulatory Location: Ambulatory outpatient facility Delivery: Face-to-face Provider: Wallie Sherry, MD Specialty: Interventional Pain Management Specialty designation: 09 Location: Outpatient facility Ref. Prov.: Iven Harlene CROME, MD       Interventional Therapy   Procedure: Suprascapular nerve block (SSNB) #1  Laterality:  Right  Level: Superior to scapular spine, lateral to supraspinatus fossa (Suprascapular notch).  Imaging: Fluoroscopic guidance         Anesthesia: Local anesthesia (1-2% Lidocaine ) DOS: 03/09/2024  Performed by: Wallie Sherry, MD  Purpose: Diagnostic/Therapeutic Indications: Shoulder pain, severe enough to impact quality of life and/or function.  NAS-11 score:   Pre-procedure: 5 /10   Post-procedure: 6 /10     Target: Suprascapular nerve Location: midway between the medial border of the scapula and the acromion as it runs through the suprascapular notch. Region: Suprascapular, posterior shoulder  Approach: Percutaneous  Neuroanatomy: The suprascapular nerve is the lateral branch of the superior trunk of the brachial plexus. It receives nerve fibers that originate in the nerve roots C5 and C6 (and sometimes C4). It is a mixed nerve, meaning that it provides both sensory and motor supply for the suprascapular region. Function: The main function of this nerve is to provide motor innervation for two muscles, the supraspinatus and infraspinatus muscles. They are  part of the rotator cuff muscles. In addition, the suprascapular nerve provides a sensory supply to the joints of the scapula (glenohumeral and acromioclavicular joints). Rationale (medical necessity): procedure needed and proper for the diagnosis and/or treatment of the patient's medical symptoms and needs.  Position / Prep / Materials:  Position: Prone Materials:  Tray: Block Needle(s):  Type: Spinal  Gauge (G): 22  Length: 3.5 in.  Qty: 1 Prep solution: ChloraPrep (2% chlorhexidine gluconate and 70% isopropyl alcohol) Prep Area: Entire posterior shoulder area. From upper spine to shoulder proper (upper arm), and from lateral neck to lower tip of shoulder blade.   H&P (Pre-op Assessment):  Mr. Serviss is a 58 y.o. (year old), male patient, seen today for interventional treatment. He  has a past surgical history that includes Colon surgery. Mr. Pembroke has a current medication list which includes the following prescription(s): acetaminophen , albuterol, albuterol, azelastine, benazepril , fasenra, carboxymethylcellulose sodium, cyanocobalamin, diclofenac sodium, epinephrine, gabapentin , hydrochlorothiazide , loratadine, morphine, naproxen, naproxen sodium, pravastatin, testosterone  cypionate, tiotropium bromide monohydrate, and tramadol . His primarily concern today is the Neck Pain (Right )  Initial Vital Signs:  Pulse/HCG Rate: 78ECG Heart Rate: 77 Temp: 99.8 F (37.7 C) Resp: 16 BP: (!) 151/79 SpO2: 97 %  BMI: Estimated body mass index is 34.97 kg/m as calculated from the following:   Height as of this encounter: 5' 8 (1.727 m).   Weight as of this encounter: 230 lb (104.3 kg).  Risk Assessment: Allergies: Reviewed. He has no known allergies.  Allergy Precautions: None required Coagulopathies: Reviewed. None identified.  Blood-thinner therapy: None at this time Active Infection(s): Reviewed. None identified. Mr. Standley is afebrile  Site Confirmation: Mr. Blankenhorn was asked to confirm the  procedure and laterality before marking the site Procedure checklist: Completed Consent: Before the procedure and under the influence of no  sedative(s), amnesic(s), or anxiolytics, the patient was informed of the treatment options, risks and possible complications. To fulfill our ethical and legal obligations, as recommended by the American Medical Association's Code of Ethics, I have informed the patient of my clinical impression; the nature and purpose of the treatment or procedure; the risks, benefits, and possible complications of the intervention; the alternatives, including doing nothing; the risk(s) and benefit(s) of the alternative treatment(s) or procedure(s); and the risk(s) and benefit(s) of doing nothing. The patient was provided information about the general risks and possible complications associated with the procedure. These may include, but are not limited to: failure to achieve desired goals, infection, bleeding, organ or nerve damage, allergic reactions, paralysis, and death. In addition, the patient was informed of those risks and complications associated to the procedure, such as failure to decrease pain; infection; bleeding; organ or nerve damage with subsequent damage to sensory, motor, and/or autonomic systems, resulting in permanent pain, numbness, and/or weakness of one or several areas of the body; allergic reactions; (i.e.: anaphylactic reaction); and/or death. Furthermore, the patient was informed of those risks and complications associated with the medications. These include, but are not limited to: allergic reactions (i.e.: anaphylactic or anaphylactoid reaction(s)); adrenal axis suppression; blood sugar elevation that in diabetics may result in ketoacidosis or comma; water retention that in patients with history of congestive heart failure may result in shortness of breath, pulmonary edema, and decompensation with resultant heart failure; weight gain; swelling or edema;  medication-induced neural toxicity; particulate matter embolism and blood vessel occlusion with resultant organ, and/or nervous system infarction; and/or aseptic necrosis of one or more joints. Finally, the patient was informed that Medicine is not an exact science; therefore, there is also the possibility of unforeseen or unpredictable risks and/or possible complications that may result in a catastrophic outcome. The patient indicated having understood very clearly. We have given the patient no guarantees and we have made no promises. Enough time was given to the patient to ask questions, all of which were answered to the patient's satisfaction. Mr. Sommerville has indicated that he wanted to continue with the procedure. Attestation: I, the ordering provider, attest that I have discussed with the patient the benefits, risks, side-effects, alternatives, likelihood of achieving goals, and potential problems during recovery for the procedure that I have provided informed consent. Date  Time: 03/09/2024 11:13 AM  Pre-Procedure Preparation:  Monitoring: As per clinic protocol. Respiration, ETCO2, SpO2, BP, heart rate and rhythm monitor placed and checked for adequate function Safety Precautions: Patient was assessed for positional comfort and pressure points before starting the procedure. Time-out: I initiated and conducted the Time-out before starting the procedure, as per protocol. The patient was asked to participate by confirming the accuracy of the Time Out information. Verification of the correct person, site, and procedure were performed and confirmed by me, the nursing staff, and the patient. Time-out conducted as per Joint Commission's Universal Protocol (UP.01.01.01). Time: 1152 Start Time: 1152 hrs.  Description of Procedure:          Procedural Technique Safety Precautions: Aspiration looking for blood return was conducted prior to all injections. At no point did we inject any substances, as a  needle was being advanced. No attempts were made at seeking any paresthesias. Safe injection practices and needle disposal techniques used. Medications properly checked for expiration dates. SDV (single dose vial) medications used. Description of the Procedure: Protocol guidelines were followed. The patient was placed in position over the procedure table. The target  area was identified and the area prepped in the usual manner. Skin & deeper tissues infiltrated with local anesthetic. Appropriate amount of time allowed to pass for local anesthetics to take effect. The procedure needles were then advanced to the target area. Proper needle placement secured. Negative aspiration confirmed. Solution injected in intermittent fashion, asking for systemic symptoms every 0.5cc of injectate. The needles were then removed and the area cleansed, making sure to leave some of the prepping solution back to take advantage of its long term bactericidal properties.  5cc solution made of 4 cc of 0.2% ropivacaine , 1 cc of Decadron  10 mg/cc.   Vitals:   03/09/24 1121 03/09/24 1153 03/09/24 1201 03/09/24 1204  BP: (!) 151/79 (!) 159/97 (!) 164/107 (!) 145/86  Pulse: 78     Resp: 16 15 17    Temp: 99.8 F (37.7 C)     TempSrc: Temporal     SpO2: 97% 96% 96%   Weight: 230 lb (104.3 kg)     Height: 5' 8 (1.727 m)        Start Time: 1152 hrs. End Time: 1200 hrs.  Imaging Guidance (nonSpinal):         Type of Imaging Technique: Fluoroscopy Guidance (npnSpinal) Indication(s): Fluoroscopy guidance for needle placement to enhance accuracy in procedures requiring precise needle localization for targeted delivery of medication in or near specific anatomical locations not easily accessible without such real-time imaging assistance. Exposure Time: Please see nurses notes. Contrast: Before injecting any contrast, we confirmed that the patient did not have an allergy to iodine, shellfish, or radiological contrast. Once  satisfactory needle placement was completed at the desired level, radiological contrast was injected. Contrast injected under live fluoroscopy. No contrast complications. See chart for type and volume of contrast used. Fluoroscopic Guidance: I was personally present during the use of fluoroscopy. Tunnel Vision Technique used to obtain the best possible view of the target area. Parallax error corrected before commencing the procedure. Direction-depth-direction technique used to introduce the needle under continuous pulsed fluoroscopy. Once target was reached, antero-posterior, oblique, and lateral fluoroscopic projection used confirm needle placement in all planes. Images permanently stored in EMR. Interpretation: I personally interpreted the imaging intraoperatively. Adequate needle placement confirmed in multiple planes. Appropriate spread of contrast into desired area was observed. No evidence of afferent or efferent intravascular uptake. No intrathecal or subarachnoid spread observed. Permanent images saved into the patient's record.  Antibiotic Prophylaxis:   Anti-infectives (From admission, onward)    None      Indication(s): None identified  Post-operative Assessment:  Post-procedure Vital Signs:  Pulse/HCG Rate: 7886 Temp: 99.8 F (37.7 C) Resp: 17 BP: (!) 145/86 SpO2: 96 %  EBL: None  Complications: No immediate post-treatment complications observed by team, or reported by patient.  Note: The patient tolerated the entire procedure well. A repeat set of vitals were taken after the procedure and the patient was kept under observation following institutional policy, for this type of procedure. Post-procedural neurological assessment was performed, showing return to baseline, prior to discharge. The patient was provided with post-procedure discharge instructions, including a section on how to identify potential problems. Should any problems arise concerning this procedure, the  patient was given instructions to immediately contact us , at any time, without hesitation. In any case, we plan to contact the patient by telephone for a follow-up status report regarding this interventional procedure.  Comments:  No additional relevant information.  Plan of Care (POC)  Orders:  No orders of the defined types  were placed in this encounter.   Medications ordered for procedure: Meds ordered this encounter  Medications   iohexol  (OMNIPAQUE ) 180 MG/ML injection 10 mL    Must be Myelogram-compatible. If not available, you may substitute with a water-soluble, non-ionic, hypoallergenic, myelogram-compatible radiological contrast medium.   lidocaine  (XYLOCAINE ) 2 % (with pres) injection 400 mg   ropivacaine  (PF) 2 mg/mL (0.2%) (NAROPIN ) injection 1 mL   sodium chloride  flush (NS) 0.9 % injection 1 mL   dexamethasone  (DECADRON ) injection 10 mg   dexamethasone  (DECADRON ) injection 10 mg   Medications administered: We administered iohexol , lidocaine , ropivacaine  (PF) 2 mg/mL (0.2%), sodium chloride  flush, dexamethasone , and dexamethasone .  See the medical record for exact dosing, route, and time of administration.    Follow-up plan:   Return in about 8 weeks (around 05/04/2024), or F2F PPE Dr Marcelino.     Recent Visits Date Type Provider Dept  02/23/24 Office Visit Marcelino Nurse, MD [facility]  Showing recent visits within past 90 days and meeting all other requirements Today's Visits Date Type Provider Dept  03/09/24 Procedure visit Marcelino Nurse, MD [facility]  Showing today's visits and meeting all other requirements Future Appointments No visits were found meeting these conditions. Showing future appointments within next 90 days and meeting all other requirements   Disposition: Discharge home  Discharge (Date  Time): 03/09/2024; 1205 hrs.   Primary Care Physician: Iven Harlene CROME, MD Location: [facility]  Facility Note by: Nurse Marcelino, MD (TTS technology used. I apologize for any typographical errors that were not detected and corrected.) Date: 03/09/2024; Time: 12:46 PM  Disclaimer:  Medicine is not an visual merchandiser. The only guarantee in medicine is that nothing is guaranteed. It is important to note that the decision to proceed with this intervention was based on the information collected from the patient. The Data and conclusions were drawn from the patient's questionnaire, the interview, and the physical examination. Because the information was provided in large part by the patient, it cannot be guaranteed that it has not been purposely or unconsciously manipulated. Every effort has been made to obtain as much relevant data as possible for this evaluation. It is important to note that the conclusions that lead to this procedure are derived in large part from the available data. Always take into account that the treatment will also be dependent on availability of resources and existing treatment guidelines, considered by other Pain Management Practitioners as being common knowledge and practice, at the time of the intervention. For Medico-Legal purposes, it is also important to point out that variation in procedural techniques and pharmacological choices are the acceptable norm. The indications, contraindications, technique, and results of the above procedure should only be interpreted and judged by a Board-Certified Interventional Pain Specialist with extensive familiarity and expertise in the same exact procedure and technique.

## 2024-03-10 ENCOUNTER — Telehealth: Payer: Self-pay | Admitting: *Deleted

## 2024-03-10 NOTE — Telephone Encounter (Signed)
 Post procedure call;  had a hard night, using ice throughout and pain meds as prescribed.  Better this morning, will continue to monitor.

## 2024-05-31 ENCOUNTER — Ambulatory Visit: Admitting: Student in an Organized Health Care Education/Training Program

## 2024-05-31 ENCOUNTER — Encounter: Payer: Self-pay | Admitting: Student in an Organized Health Care Education/Training Program

## 2024-05-31 VITALS — BP 127/80 | HR 93 | Temp 99.6°F | Resp 16 | Ht 68.0 in | Wt 226.0 lb

## 2024-05-31 DIAGNOSIS — G894 Chronic pain syndrome: Secondary | ICD-10-CM | POA: Diagnosis not present

## 2024-05-31 DIAGNOSIS — M47894 Other spondylosis, thoracic region: Secondary | ICD-10-CM | POA: Diagnosis not present

## 2024-05-31 DIAGNOSIS — M5126 Other intervertebral disc displacement, lumbar region: Secondary | ICD-10-CM

## 2024-05-31 DIAGNOSIS — M5416 Radiculopathy, lumbar region: Secondary | ICD-10-CM | POA: Diagnosis not present

## 2024-05-31 DIAGNOSIS — M47814 Spondylosis without myelopathy or radiculopathy, thoracic region: Secondary | ICD-10-CM

## 2024-05-31 NOTE — Patient Instructions (Addendum)
 " ______________________________________________________________________    General Risks and Possible Complications  Patient Responsibilities: It is important that you read this as it is part of your informed consent. It is our duty to inform you of the risks and possible complications associated with treatments offered to you. It is your responsibility as a patient to read this and to ask questions about anything that is not clear or that you believe was not covered in this document.  Patients Rights: You have the right to refuse treatment. You also have the right to change your mind, even after initially having agreed to have the treatment done. However, under this last option, if you wait until the last second to change your mind, you may be charged for the materials used up to that point.  Introduction: Medicine is not an visual merchandiser. Everything in Medicine, including the lack of treatment(s), carries the potential for danger, harm, or loss (which is by definition: Risk). In Medicine, a complication is a secondary problem, condition, or disease that can aggravate an already existing one. All treatments carry the risk of possible complications. The fact that a side effects or complications occurs, does not imply that the treatment was conducted incorrectly. It must be clearly understood that these can happen even when everything is done following the highest safety standards.  No treatment: You can choose not to proceed with the proposed treatment alternative. The PRO(s) would include: avoiding the risk of complications associated with the therapy. The CON(s) would include: not getting any of the treatment benefits. These benefits fall under one of three categories: diagnostic; therapeutic; and/or palliative. Diagnostic benefits include: getting information which can ultimately lead to improvement of the disease or symptom(s). Therapeutic benefits are those associated with the successful  treatment of the disease. Finally, palliative benefits are those related to the decrease of the primary symptoms, without necessarily curing the condition (example: decreasing the pain from a flare-up of a chronic condition, such as incurable terminal cancer).  General Risks and Complications: These are associated to most interventional treatments. They can occur alone, or in combination. They fall under one of the following six (6) categories: no benefit or worsening of symptoms; bleeding; infection; nerve damage; allergic reactions; and/or death. No benefits or worsening of symptoms: In Medicine there are no guarantees, only probabilities. No healthcare provider can ever guarantee that a medical treatment will work, they can only state the probability that it may. Furthermore, there is always the possibility that the condition may worsen, either directly, or indirectly, as a consequence of the treatment. Bleeding: This is more common if the patient is taking a blood thinner, either prescription or over the counter (example: Goody Powders, Fish oil, Aspirin , Garlic, etc.), or if suffering a condition associated with impaired coagulation (example: Hemophilia, cirrhosis of the liver, low platelet counts, etc.). However, even if you do not have one on these, it can still happen. If you have any of these conditions, or take one of these drugs, make sure to notify your treating physician. Infection: This is more common in patients with a compromised immune system, either due to disease (example: diabetes, cancer, human immunodeficiency virus [HIV], etc.), or due to medications or treatments (example: therapies used to treat cancer and rheumatological diseases). However, even if you do not have one on these, it can still happen. If you have any of these conditions, or take one of these drugs, make sure to notify your treating physician. Nerve Damage: This is more common when the treatment  is an invasive one, but it  can also happen with the use of medications, such as those used in the treatment of cancer. The damage can occur to small secondary nerves, or to large primary ones, such as those in the spinal cord and brain. This damage may be temporary or permanent and it may lead to impairments that can range from temporary numbness to permanent paralysis and/or brain death. Allergic Reactions: Any time a substance or material comes in contact with our body, there is the possibility of an allergic reaction. These can range from a mild skin rash (contact dermatitis) to a severe systemic reaction (anaphylactic reaction), which can result in death. Death: In general, any medical intervention can result in death, most of the time due to an unforeseen complication. ______________________________________________________________________      ______________________________________________________________________    Preparing for your procedure  Appointments: If you think you may not be able to keep your appointment, call 24-48 hours in advance to cancel. We need time to make it available to others.  Procedure visits are for procedures only. During your procedure appointment there will be: NO Prescription Refills*. NO medication changes or discussions*. NO discussion of disability issues*. NO unrelated pain problem evaluations*. NO evaluations to order other pain procedures*. *These will be addressed at a separate and distinct evaluation encounter on the provider's evaluation schedule and not during procedure days.  Instructions: Food intake: Avoid eating anything solid for at least 8 hours prior to your procedure. Clear liquid intake: You may take clear liquids such as water up to 2 hours prior to your procedure. (No carbonated drinks. No soda.) Transportation: Unless otherwise stated by your physician, bring a driver. (Driver cannot be a Market Researcher, Pharmacist, Community, or any other form of public transportation.) Morning  Medicines: Except for blood thinners, take all of your other morning medications with a sip of water. Make sure to take your heart and blood pressure medicines. If your blood pressure's lower number is above 100, the case will be rescheduled. Blood thinners: Make sure to stop your blood thinners as instructed.  If you take a blood thinner, but were not instructed to stop it, call our office [phone] and ask to talk to a nurse. Not stopping a blood thinner prior to certain procedures could lead to serious complications. Diabetics on insulin: Notify the staff so that you can be scheduled 1st case in the morning. If your diabetes requires high dose insulin, take only  of your normal insulin dose the morning of the procedure and notify the staff that you have done so. Preventing infections: Shower with an antibacterial soap the morning of your procedure.  Build-up your immune system: Take 1000 mg of Vitamin C with every meal (3 times a day) the day prior to your procedure. Antibiotics: Inform the nursing staff if you are taking any antibiotics or if you have any conditions that may require antibiotics prior to procedures. (Example: recent joint implants)   Pregnancy: If you are pregnant make sure to notify the nursing staff. Not doing so may result in injury to the fetus, including death.  Sickness: If you have a cold, fever, or any active infections, call and cancel or reschedule your procedure. Receiving steroids while having an infection may result in complications. Arrival: You must be in the facility at least 30 minutes prior to your scheduled procedure. Tardiness: Your scheduled time is also the cutoff time. If you do not arrive at least 15 minutes prior to your procedure, you  will be rescheduled.  Children: Do not bring any children with you. Make arrangements to keep them home. Dress appropriately: There is always a possibility that your clothing may get soiled. Avoid long dresses. Valuables:  Do not bring any jewelry or valuables.  Reasons to call and reschedule or cancel your procedure: (Following these recommendations will minimize the risk of a serious complication.) Surgeries: Avoid having procedures within 2 weeks of any surgery. (Avoid for 2 weeks before or after any surgery). Flu Shots: Avoid having procedures within 2 weeks of a flu shots or . (Avoid for 2 weeks before or after immunizations). Barium: Avoid having a procedure within 7-10 days after having had a radiological study involving the use of radiological contrast. (Myelograms, Barium swallow or enema study). Heart attacks: Avoid any elective procedures or surgeries for the initial 6 months after a Myocardial Infarction (Heart Attack). Blood thinners: It is imperative that you stop these medications before procedures. Let us  know if you if you take any blood thinner.  Infection: Avoid procedures during or within two weeks of an infection (including chest colds or gastrointestinal problems). Symptoms associated with infections include: Localized redness, fever, chills, night sweats or profuse sweating, burning sensation when voiding, cough, congestion, stuffiness, runny nose, sore throat, diarrhea, nausea, vomiting, cold or Flu symptoms, recent or current infections. It is specially important if the infection is over the area that we intend to treat. Heart and lung problems: Symptoms that may suggest an active cardiopulmonary problem include: cough, chest pain, breathing difficulties or shortness of breath, dizziness, ankle swelling, uncontrolled high or unusually low blood pressure, and/or palpitations. If you are experiencing any of these symptoms, cancel your procedure and contact your primary care physician for an evaluation.  Remember:  Regular Business hours are:  Monday to Thursday 8:00 AM to 4:00 PM  Provider's Schedule: Eric Como, MD:  Procedure days: Tuesday and Thursday 7:30 AM to 4:00 PM  Wallie Sherry, MD:  Procedure days: Monday and Wednesday 7:30 AM to 4:00 PM Last  Updated: 04/07/2023 ______________________________________________________________________    Radiofrequency Ablation Radiofrequency ablation is a procedure that is performed to relieve pain. The procedure is often used for back, neck, or arm pain. Radiofrequency ablation involves the use of a machine that creates radio waves to make heat. During the procedure, the heat is applied to the nerve that carries the pain signal. The heat damages the nerve and interferes with the pain signal. Pain relief usually starts about 2 weeks after the procedure and lasts for 6 months to 1 year. Tell a health care provider about: Any allergies you have. All medicines you are taking, including vitamins, herbs, eye drops, creams, and over-the-counter medicines. Any problems you or family members have had with anesthetic medicines. Any bleeding problems you have. Any surgeries you have had. Any medical conditions you have. Whether you are pregnant or may be pregnant. What are the risks? Generally, this is a safe procedure. However, problems may occur, including: Pain or soreness at the injection site. Allergic reaction to medicines given during the procedure. Bleeding. Infection at the injection site. Damage to nerves or blood vessels. What happens before the procedure? When to stop eating and drinking Follow instructions from your health care provider about what you may eat and drink before your procedure. These may include: 8 hours before the procedure Stop eating most foods. Do not eat meat, fried foods, or fatty foods. Eat only light foods, such as toast or crackers. All liquids are okay except  energy drinks and alcohol. 6 hours before the procedure Stop eating. Drink only clear liquids, such as water, clear fruit juice, black coffee, plain tea, and sports drinks. Do not drink energy drinks or alcohol. 2 hours before the  procedure Stop drinking all liquids. You may be allowed to take medicine with small sips of water. If you do not follow your health care provider's instructions, your procedure may be delayed or canceled. Medicines Ask your health care provider about: Changing or stopping your regular medicines. This is especially important if you are taking diabetes medicines or blood thinners. Taking medicines such as aspirin  and ibuprofen. These medicines can thin your blood. Do not take these medicines unless your health care provider tells you to take them. Taking over-the-counter medicines, vitamins, herbs, and supplements. General instructions Ask your health care provider what steps will be taken to help prevent infection. These steps may include: Removing hair at the procedure site. Washing skin with a germ-killing soap. Taking antibiotic medicine. If you will be going home right after the procedure, plan to have a responsible adult: Take you home from the hospital or clinic. You will not be allowed to drive. Care for you for the time you are told. What happens during the procedure?  You will be awake during the procedure. You will need to be able to talk with the health care provider during the procedure. An IV will be inserted into one of your veins. You will be given one or more of the following: A medicine to help you relax (sedative). A medicine to numb the area (local anesthetic). Your health care provider will insert a radiofrequency needle into the area to be treated. This is done with the help of fluoroscopy. A wire that carries the radio waves (electrode) will be put through the radiofrequency needle. An electrical pulse will be sent through the electrode to verify the correct nerve that is causing your pain. You will feel a tingling sensation, and you may have muscle twitching. The tissue around the needle tip will be heated by an electric current that comes from the radiofrequency  machine. This will numb the nerves. The needle will be removed. A bandage (dressing) will be put on the insertion area. The procedure may vary among health care providers and hospitals. What happens after the procedure? Your blood pressure, heart rate, breathing rate, and blood oxygen level will be monitored until you leave the hospital or clinic. Return to your normal activities as told by your health care provider. Ask your health care provider what activities are safe for you. If you were given a sedative during the procedure, it can affect you for several hours. Do not drive or operate machinery until your health care provider says that it is safe. Summary Radiofrequency ablation is a procedure that is performed to relieve pain. The procedure is often used for back, neck, or arm pain. Radiofrequency ablation involves the use of a machine that creates radio waves to make heat. Plan to have a responsible adult take you home from the hospital or clinic. Do not drive or operate machinery until your health care provider says that it is safe. Return to your normal activities as told by your health care provider. Ask your health care provider what activities are safe for you. This information is not intended to replace advice given to you by your health care provider. Make sure you discuss any questions you have with your health care provider. Document Revised: 10/02/2020 Document Reviewed: 10/02/2020  [employer]vier Patient Education  2024 [employer]Epidural Steroid Injection Patient Information  Description: The epidural space surrounds the nerves as they exit the spinal cord.  In some patients, the nerves can be compressed and inflamed by a bulging disc or a tight spinal canal (spinal stenosis).  By injecting steroids into the epidural space, we can bring irritated nerves into direct contact with a potentially helpful medication.  These steroids act directly on the irritated nerves and can reduce  swelling and inflammation which often leads to decreased pain.  Epidural steroids may be injected anywhere along the spine and from the neck to the low back depending upon the location of your pain.   After numbing the skin with local anesthetic (like Novocaine), a small needle is passed into the epidural space slowly.  You may experience a sensation of pressure while this is being done.  The entire block usually last less than 10 minutes.  Conditions which may be treated by epidural steroids:  Low back and leg pain Neck and arm pain Spinal stenosis Post-laminectomy syndrome Herpes zoster (shingles) pain Pain from compression fractures  Preparation for the injection:  Do not eat any solid food or dairy products within 8 hours of your appointment.  You may drink clear liquids up to 3 hours before appointment.  Clear liquids include water, black coffee, juice or soda.  No milk or cream please. You may take your regular medication, including pain medications, with a sip of water before your appointment  Diabetics should hold regular insulin (if taken separately) and take 1/2 normal NPH dos the morning of the procedure.  Carry some sugar containing items with you to your appointment. A driver must accompany you and be prepared to drive you home after your procedure.  Bring all your current medications with your. An IV may be inserted and sedation may be given at the discretion of the physician.   A blood pressure cuff, EKG and other monitors will often be applied during the procedure.  Some patients may need to have extra oxygen administered for a short period. You will be asked to provide medical information, including your allergies, prior to the procedure.  We must know immediately if you are taking blood thinners (like Coumadin/Warfarin)  Or if you are allergic to IV iodine contrast (dye). We must know if you could possible be pregnant.  Possible side-effects: Bleeding from needle  site Infection (rare, may require surgery) Nerve injury (rare) Numbness & tingling (temporary) Difficulty urinating (rare, temporary) Spinal headache ( a headache worse with upright posture) Light -headedness (temporary) Pain at injection site (several days) Decreased blood pressure (temporary) Weakness in arm/leg (temporary) Pressure sensation in back/neck (temporary)  Call if you experience: Fever/chills associated with headache or increased back/neck pain. Headache worsened by an upright position. New onset weakness or numbness of an extremity below the injection site Hives or difficulty breathing (go to the emergency room) Inflammation or drainage at the infection site Severe back/neck pain Any new symptoms which are concerning to you  Please note:  Although the local anesthetic injected can often make your back or neck feel good for several hours after the injection, the pain will likely return.  It takes 3-7 days for steroids to work in the epidural space.  You may not notice any pain relief for at least that one week.  If effective, we will often do a series of three injections spaced 3-6 weeks apart to maximally decrease your pain.  After the initial series,  we generally will wait several months before considering a repeat injection of the same type.  If you have any questions, please call [phone] [facility] "

## 2024-05-31 NOTE — Progress Notes (Signed)
 Safety precautions to be maintained throughout the outpatient stay will include: orient to surroundings, keep bed in low position, maintain call bell within reach at all times, provide assistance with transfer out of bed and ambulation.

## 2024-06-15 ENCOUNTER — Ambulatory Visit: Admitting: Student in an Organized Health Care Education/Training Program
